# Patient Record
Sex: Female | Born: 1935 | Race: Black or African American | Hispanic: No | Marital: Married | Smoking: Current some day smoker
Health system: Southern US, Community
[De-identification: ages and names within clinical notes are randomized; demographics above are authoritative.]

## PROBLEM LIST (undated history)

## (undated) DIAGNOSIS — I499 Cardiac arrhythmia, unspecified: Secondary | ICD-10-CM

## (undated) DIAGNOSIS — K2210 Ulcer of esophagus without bleeding: Secondary | ICD-10-CM

## (undated) DIAGNOSIS — Z973 Presence of spectacles and contact lenses: Secondary | ICD-10-CM

## (undated) DIAGNOSIS — R142 Eructation: Secondary | ICD-10-CM

## (undated) DIAGNOSIS — K219 Gastro-esophageal reflux disease without esophagitis: Secondary | ICD-10-CM

## (undated) DIAGNOSIS — R143 Flatulence: Secondary | ICD-10-CM

## (undated) DIAGNOSIS — R1110 Vomiting, unspecified: Secondary | ICD-10-CM

## (undated) DIAGNOSIS — J349 Unspecified disorder of nose and nasal sinuses: Secondary | ICD-10-CM

## (undated) DIAGNOSIS — I219 Acute myocardial infarction, unspecified: Secondary | ICD-10-CM

## (undated) DIAGNOSIS — C50919 Malignant neoplasm of unspecified site of unspecified female breast: Secondary | ICD-10-CM

## (undated) DIAGNOSIS — R141 Gas pain: Secondary | ICD-10-CM

## (undated) DIAGNOSIS — M1990 Unspecified osteoarthritis, unspecified site: Secondary | ICD-10-CM

## (undated) DIAGNOSIS — I10 Essential (primary) hypertension: Secondary | ICD-10-CM

## (undated) DIAGNOSIS — R110 Nausea: Secondary | ICD-10-CM

## (undated) DIAGNOSIS — D509 Iron deficiency anemia, unspecified: Secondary | ICD-10-CM

## (undated) HISTORY — PX: BREAST EXCISIONAL BIOPSY: SUR124

## (undated) HISTORY — DX: Gastro-esophageal reflux disease without esophagitis: K21.9

## (undated) HISTORY — DX: Unspecified disorder of nose and nasal sinuses: J34.9

## (undated) HISTORY — DX: Unspecified osteoarthritis, unspecified site: M19.90

## (undated) HISTORY — DX: Iron deficiency anemia, unspecified: D50.9

## (undated) HISTORY — PX: CHOLECYSTECTOMY: SHX55

## (undated) HISTORY — DX: Cardiac arrhythmia, unspecified: I49.9

## (undated) HISTORY — DX: Presence of spectacles and contact lenses: Z97.3

## (undated) HISTORY — PX: COLONOSCOPY: SHX174

## (undated) HISTORY — DX: Ulcer of esophagus without bleeding: K22.10

## (undated) HISTORY — PX: ABDOMINAL HYSTERECTOMY: SHX81

## (undated) HISTORY — DX: Gas pain: R14.1

## (undated) HISTORY — DX: Eructation: R14.3

## (undated) HISTORY — DX: Vomiting, unspecified: R11.10

## (undated) HISTORY — PX: FOOT SURGERY: SHX648

## (undated) HISTORY — DX: Eructation: R14.2

## (undated) HISTORY — DX: Essential (primary) hypertension: I10

## (undated) HISTORY — DX: Nausea: R11.0

## (undated) HISTORY — DX: Acute myocardial infarction, unspecified: I21.9

---

## 2004-01-08 ENCOUNTER — Ambulatory Visit (HOSPITAL_COMMUNITY): Admission: RE | Admit: 2004-01-08 | Discharge: 2004-01-08 | Payer: Self-pay | Admitting: Internal Medicine

## 2004-01-08 ENCOUNTER — Emergency Department (HOSPITAL_COMMUNITY): Admission: AD | Admit: 2004-01-08 | Discharge: 2004-01-08 | Payer: Self-pay | Admitting: Internal Medicine

## 2004-03-02 ENCOUNTER — Emergency Department (HOSPITAL_COMMUNITY): Admission: EM | Admit: 2004-03-02 | Discharge: 2004-03-02 | Payer: Self-pay | Admitting: Emergency Medicine

## 2004-03-26 ENCOUNTER — Encounter: Admission: RE | Admit: 2004-03-26 | Discharge: 2004-03-26 | Payer: Self-pay | Admitting: Cardiology

## 2005-05-23 ENCOUNTER — Ambulatory Visit (HOSPITAL_COMMUNITY): Admission: RE | Admit: 2005-05-23 | Discharge: 2005-05-23 | Payer: Self-pay | Admitting: Cardiology

## 2005-10-30 IMAGING — CR DG CHEST 2V
2 series · 2 of 2 positions shown · non-contrast
Comparison: none

CLINICAL DATA: cough, fever
 TWO VIEW CHEST
 There are no prior studies available for comparison. There is mild cardiomegaly with a left ventricular configuration.  There is a vague area of increased density seen within the left base adjacent to the left heart border. This may represent small area of infiltrate. An ill-defined nodule could also get this appearance, and I would recommend a follow-up study after treatment to be certain this area clears. There is also a questionable vague nodular density associated with a right mid to lower lung zone overlying the posterior right eighth rib. Thoracic osteophytosis noted.  No hilar or mediastinal abnormalities.
 IMPRESSION
   Cardiomegaly.
  Vague      density left base which may represent a focal area of      atelectasis/infiltrate or an ill-defined nodule.  Also question nodular density right mid to lower lung zone      (vs artifactual shadow).  Recommend      follow-up study after treatment (2-3 weeks) with attention to these areas.

[view not recorded (1 of 2)]
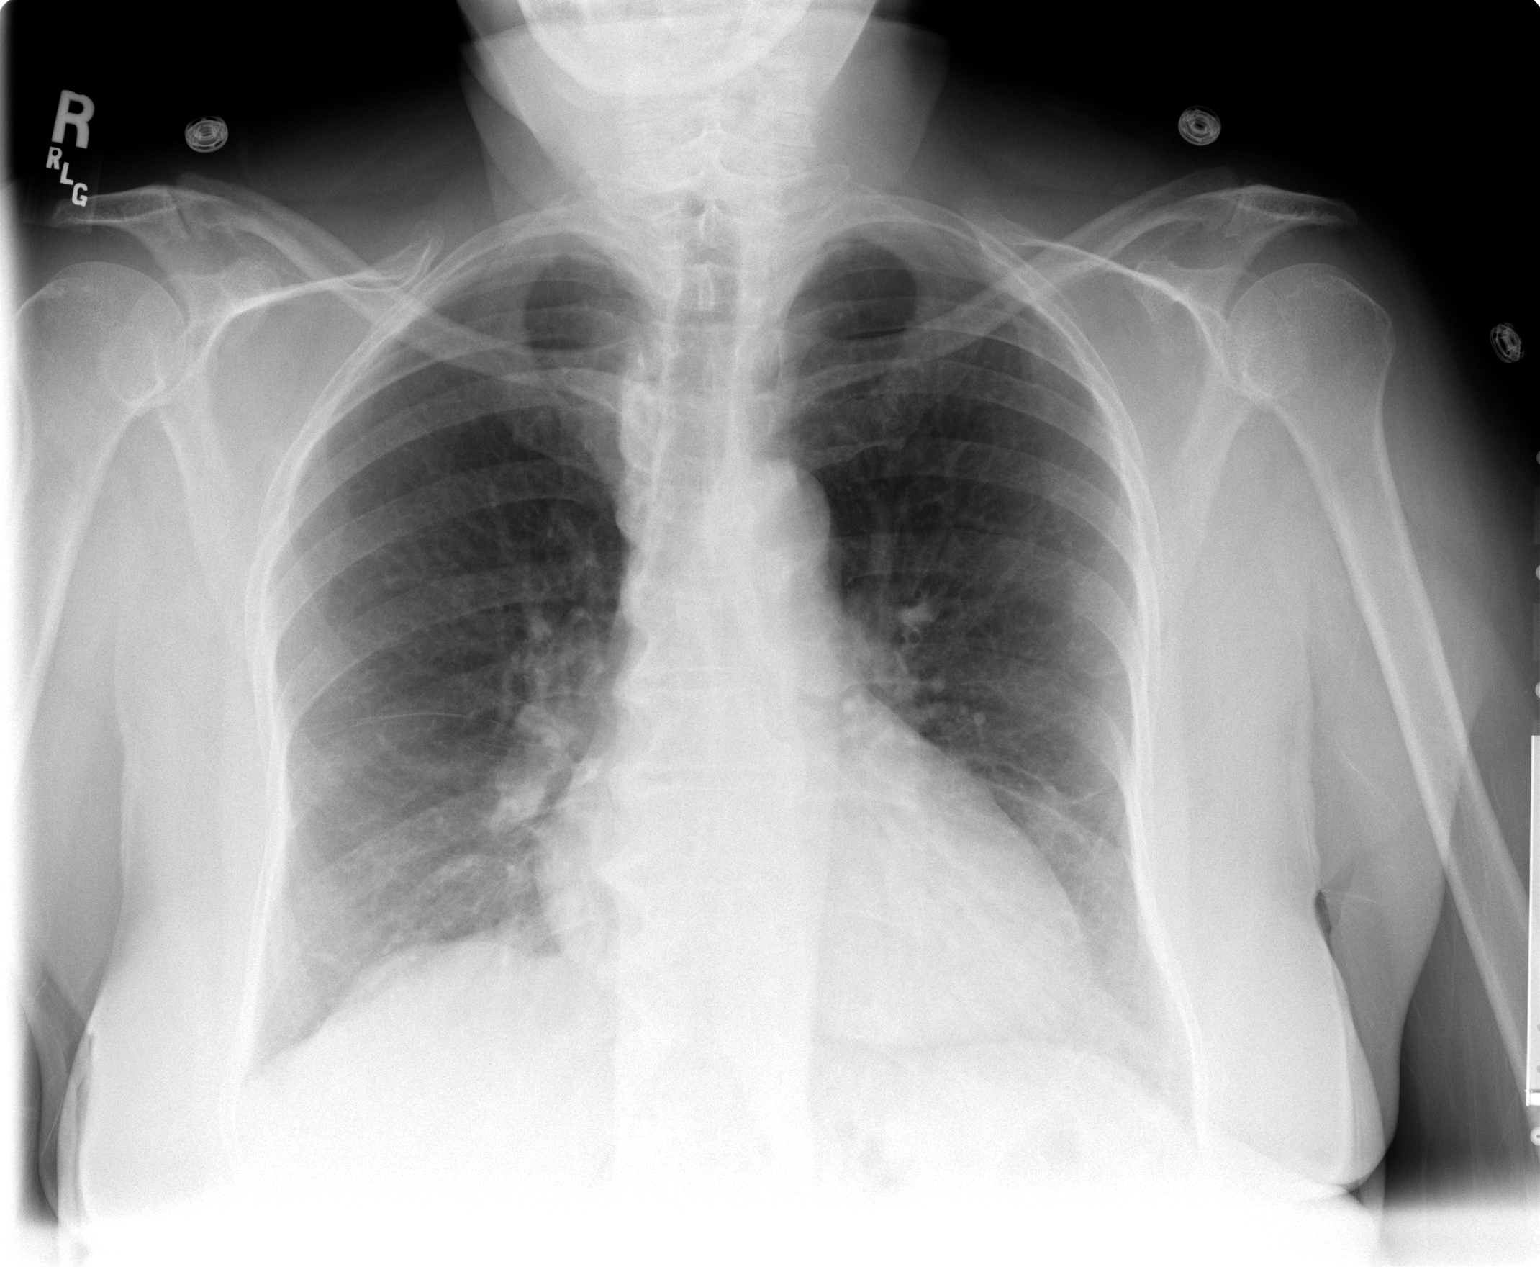

[view not recorded (2 of 2)]
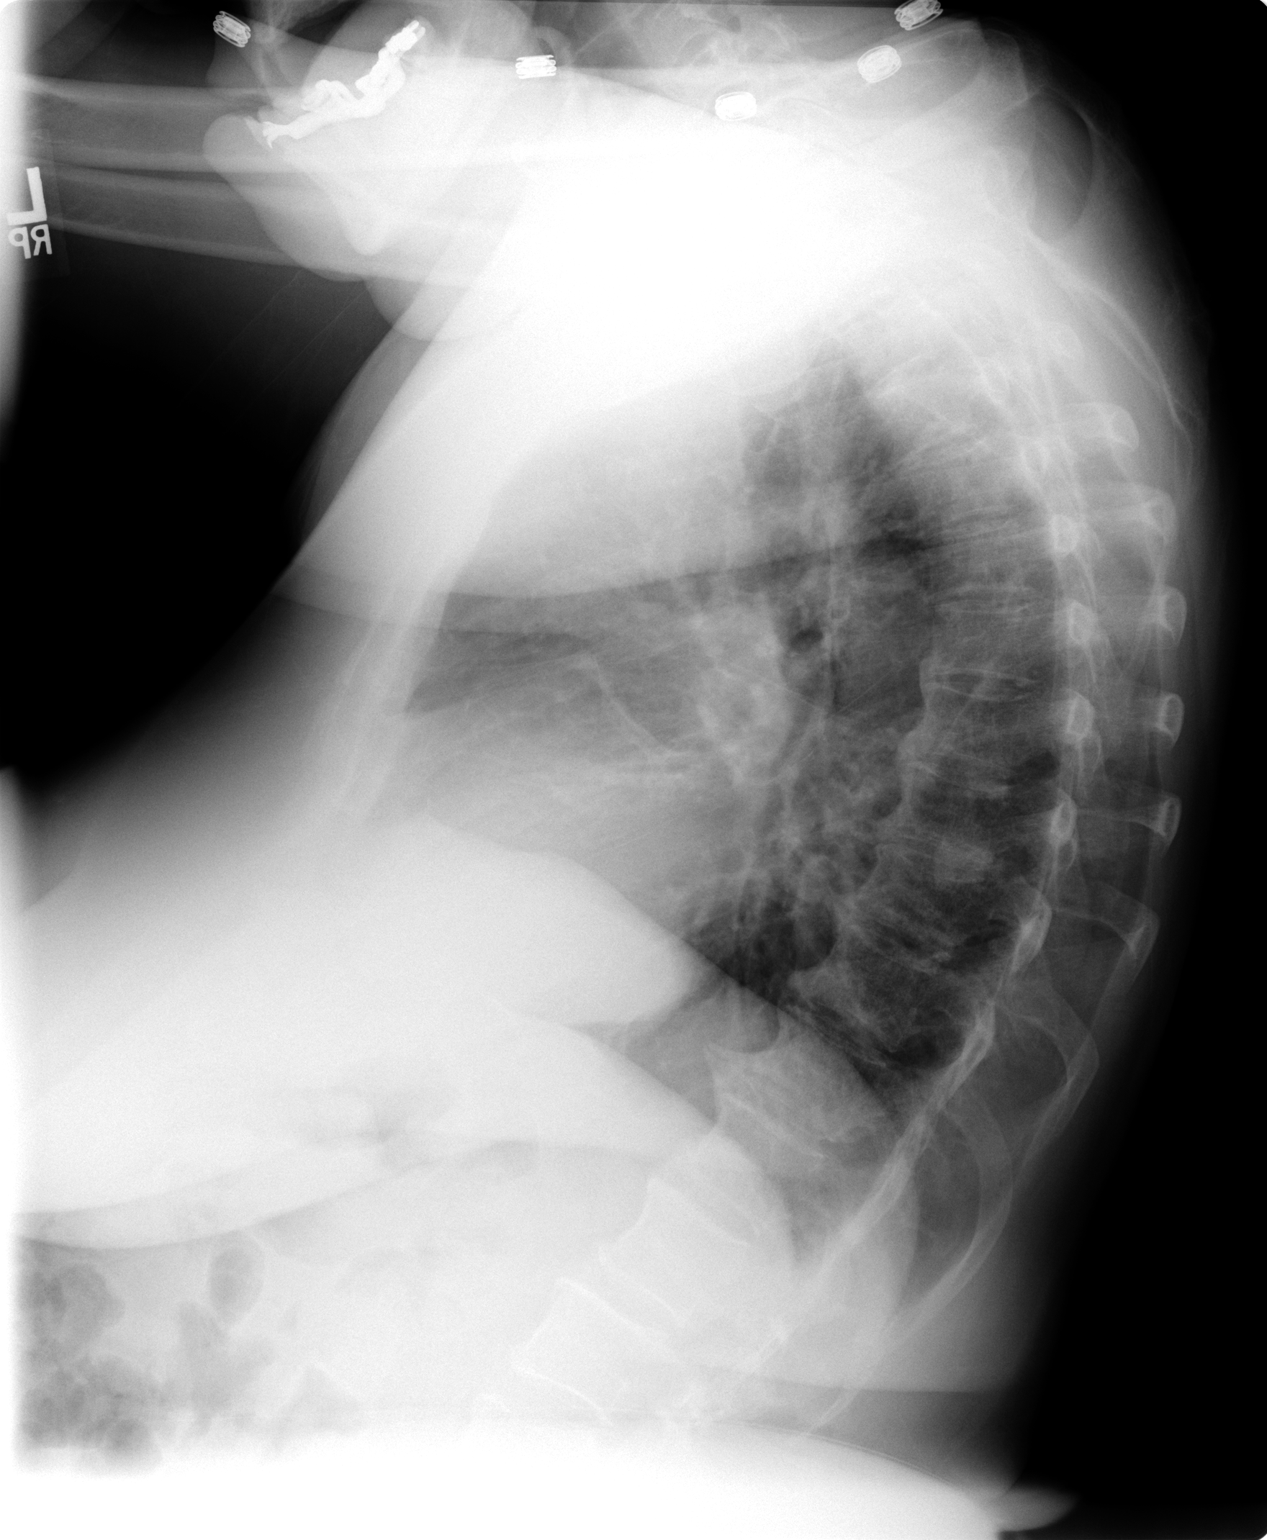

[2 of 2 positions shown; findings below may reference images not displayed]

## 2005-12-28 ENCOUNTER — Emergency Department (HOSPITAL_COMMUNITY): Admission: EM | Admit: 2005-12-28 | Discharge: 2005-12-28 | Payer: Self-pay | Admitting: Family Medicine

## 2006-08-04 ENCOUNTER — Encounter: Admission: RE | Admit: 2006-08-04 | Discharge: 2006-08-04 | Payer: Self-pay | Admitting: Cardiology

## 2007-05-01 ENCOUNTER — Encounter: Admission: RE | Admit: 2007-05-01 | Discharge: 2007-05-01 | Payer: Self-pay | Admitting: Gastroenterology

## 2007-07-17 ENCOUNTER — Encounter (INDEPENDENT_AMBULATORY_CARE_PROVIDER_SITE_OTHER): Payer: Self-pay | Admitting: Surgery

## 2007-07-17 ENCOUNTER — Inpatient Hospital Stay (HOSPITAL_COMMUNITY): Admission: RE | Admit: 2007-07-17 | Discharge: 2007-07-24 | Payer: Self-pay | Admitting: Surgery

## 2007-07-29 ENCOUNTER — Ambulatory Visit (HOSPITAL_COMMUNITY): Admission: RE | Admit: 2007-07-29 | Discharge: 2007-07-29 | Payer: Self-pay | Admitting: *Deleted

## 2007-08-17 ENCOUNTER — Ambulatory Visit (HOSPITAL_COMMUNITY): Admission: RE | Admit: 2007-08-17 | Discharge: 2007-08-17 | Payer: Self-pay | Admitting: Surgery

## 2009-03-14 IMAGING — CR DG CHEST 2V
2 series · 2 of 2 positions shown · non-contrast
Comparison: 03/26/04.

CLINICAL DATA: Preop for cholecystectomy.   
 CHEST - 2 VIEW:

[w chest pa]
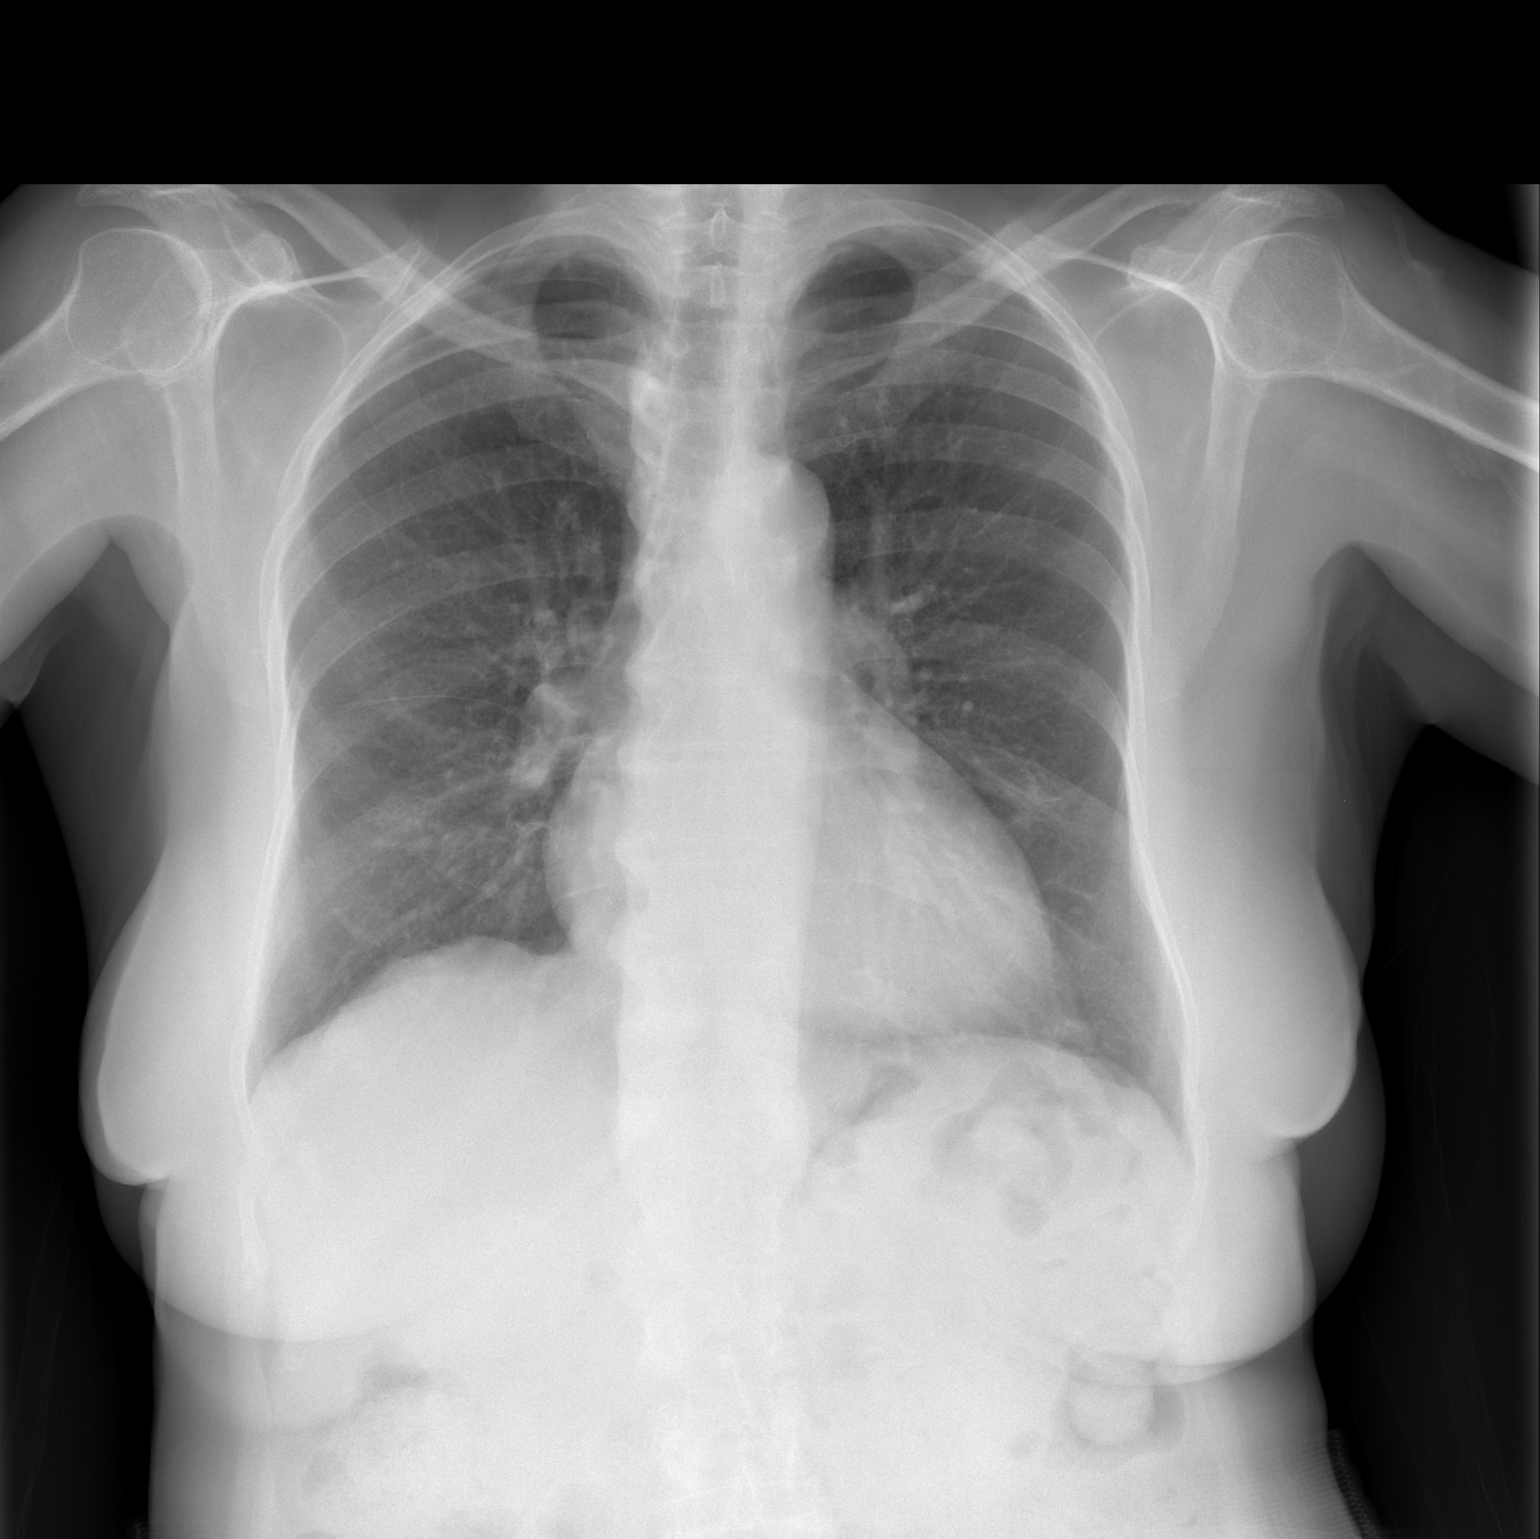

[w chest lat]
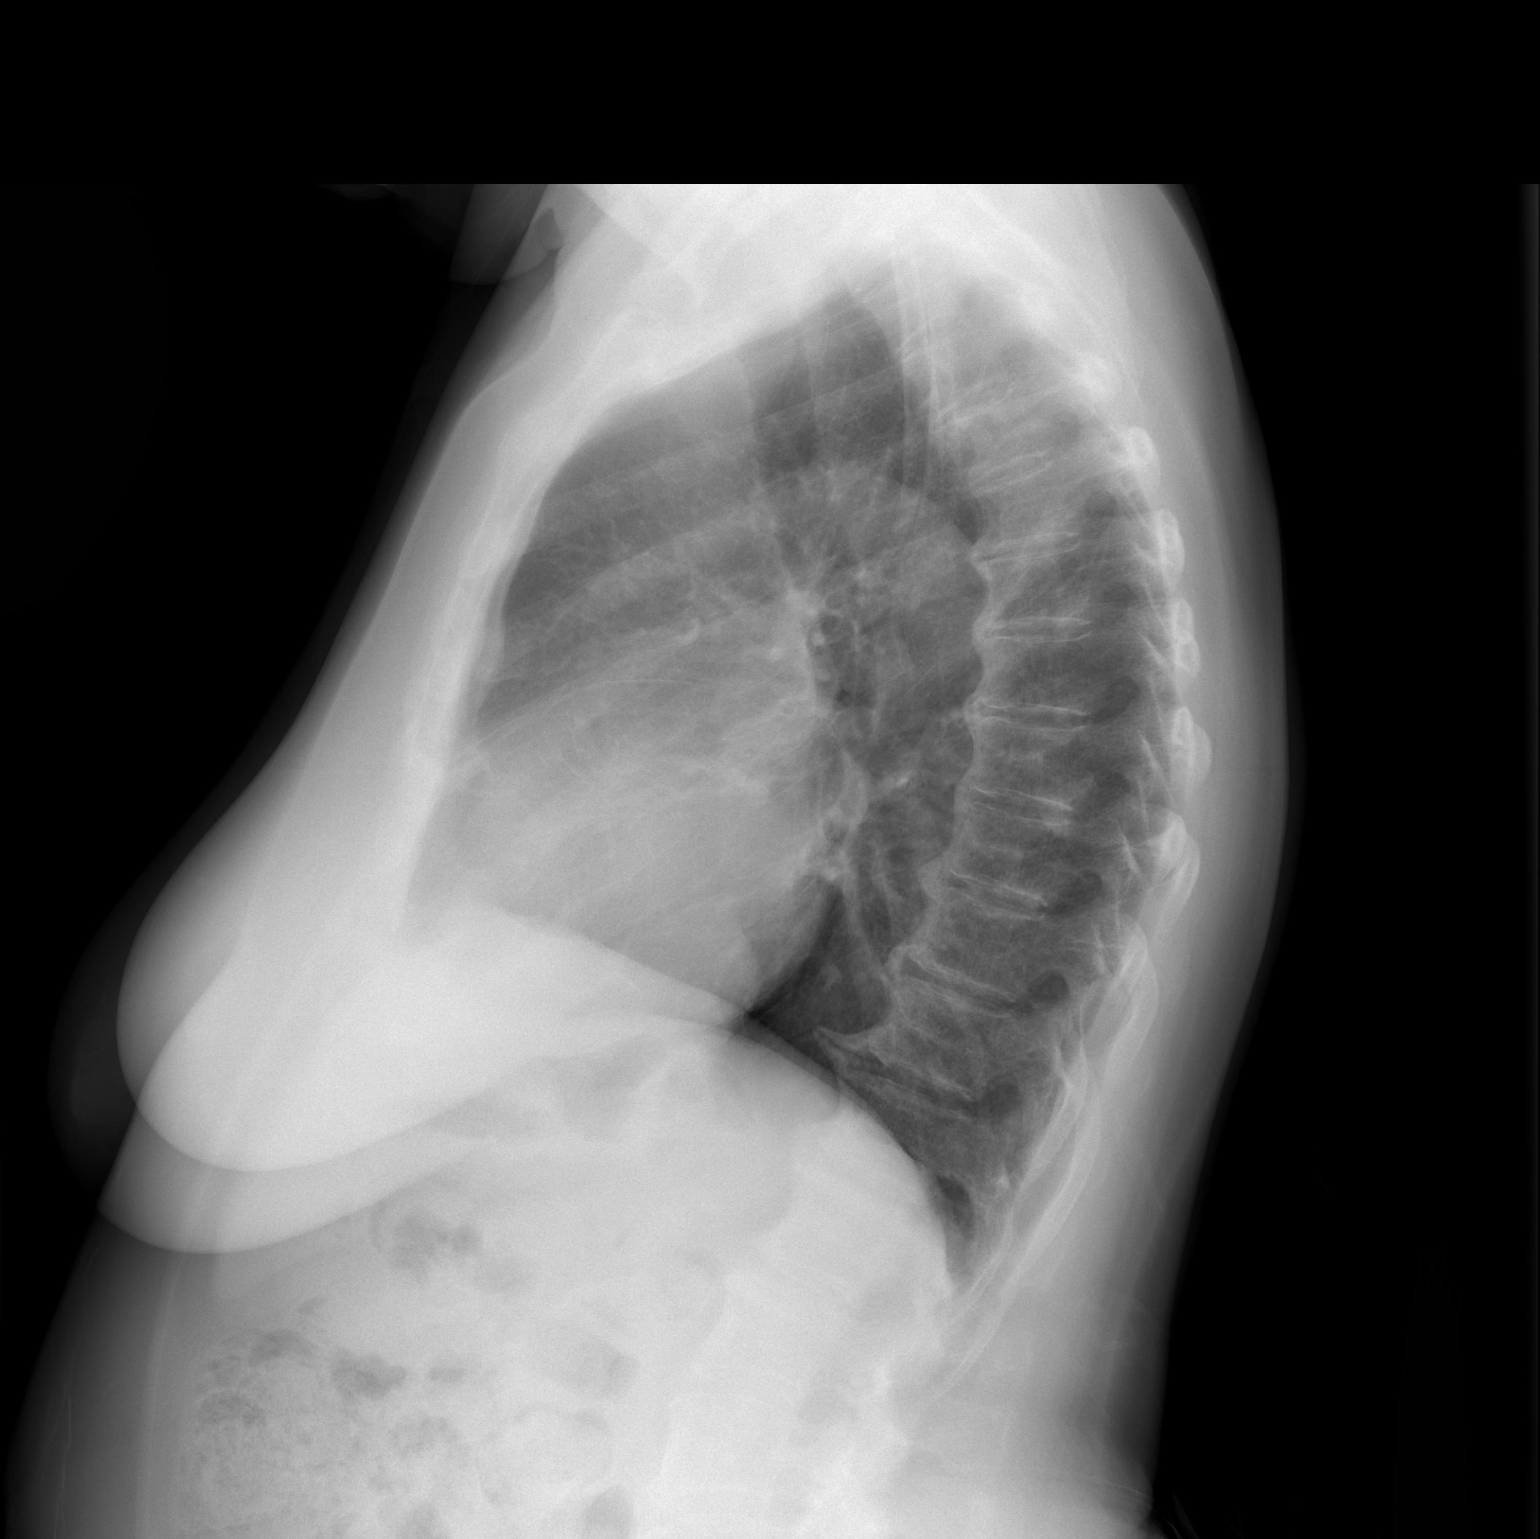

[2 of 2 positions shown; findings below may reference images not displayed]

FINDINGS: The heart is slightly enlarged as before.  There is peribronchial thickening without active air space disease.  The lungs are mildly hyperaerated.  
 There are degenerative changes of the spine.  No bony lesions.
IMPRESSION: Mild cardiomegaly and chronic lung changes ? no acute process and no significant change since 03/26/04.

## 2009-03-15 IMAGING — RF DG CHOLANGIOGRAM OPERATIVE
1 series · 8 of 8 positions shown · non-contrast
Comparison: none

CLINICAL DATA: Cholelithiasis

[Series 1: run · 2 acquisitions, 8 frames shown]
[im 1/2]
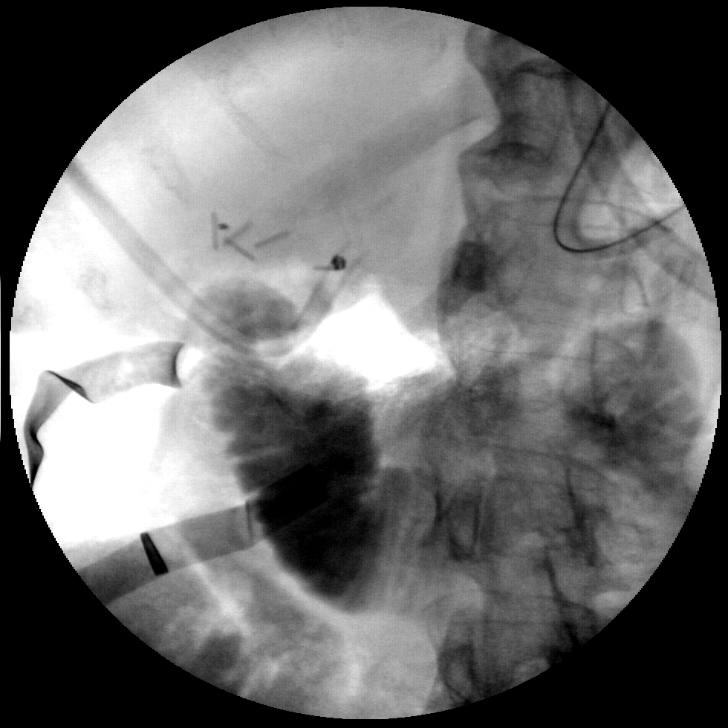
[im 1/2]
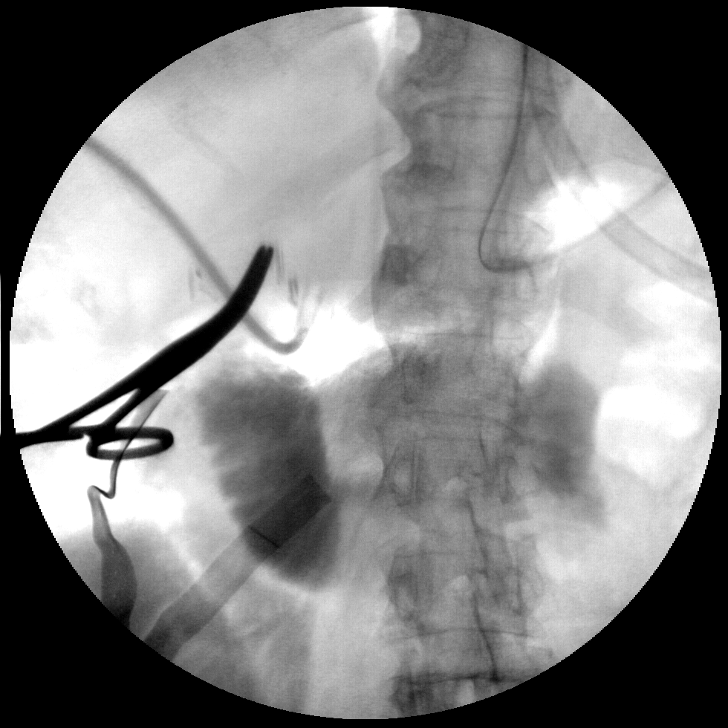
[im 1/2]
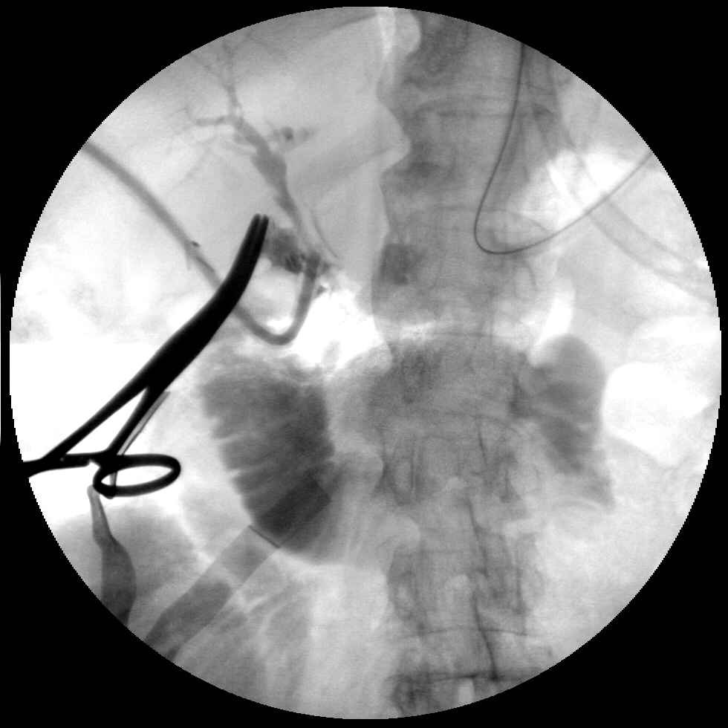
[im 1/2]
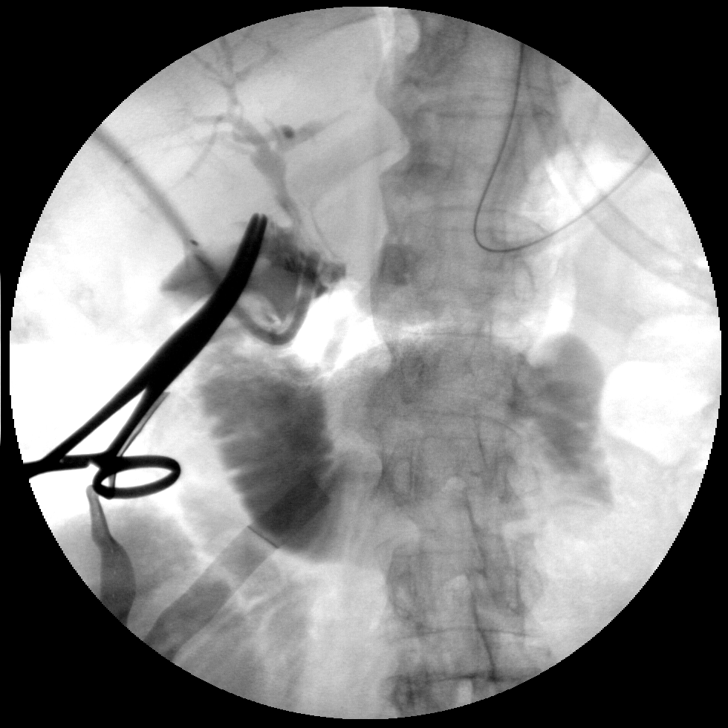
[im 2/2]
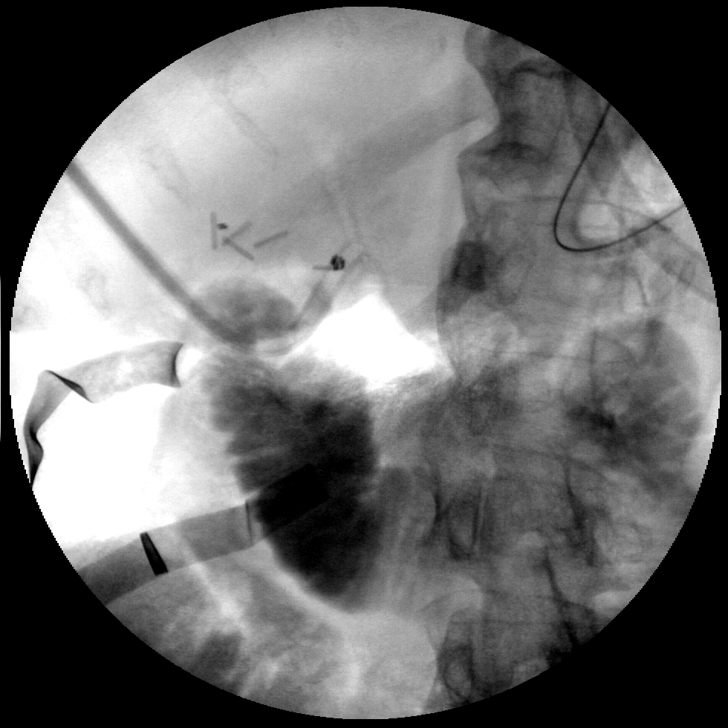
[im 2/2]
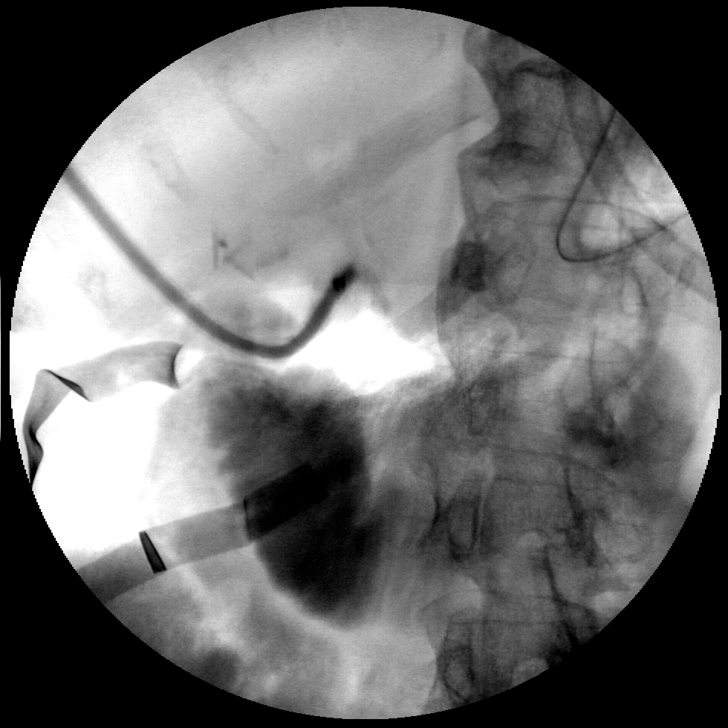
[im 2/2]
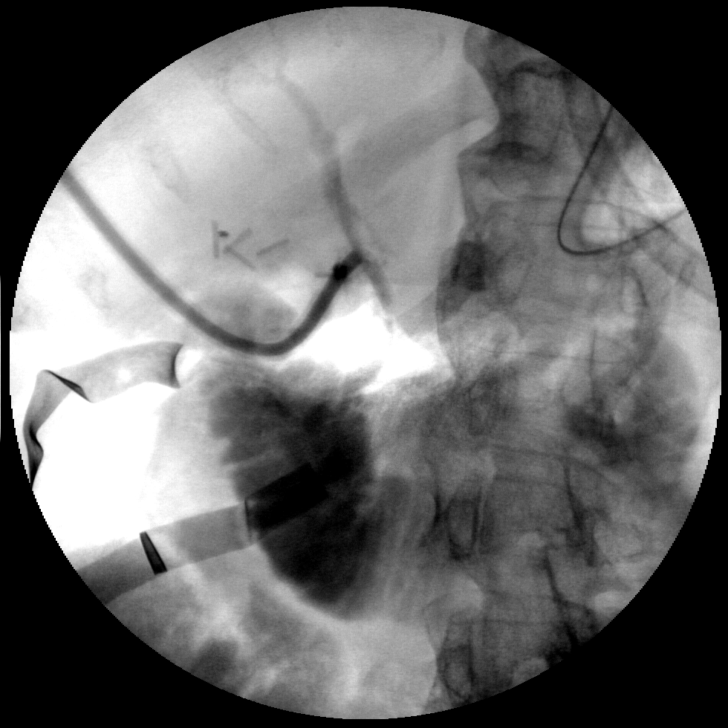
[im 2/2]
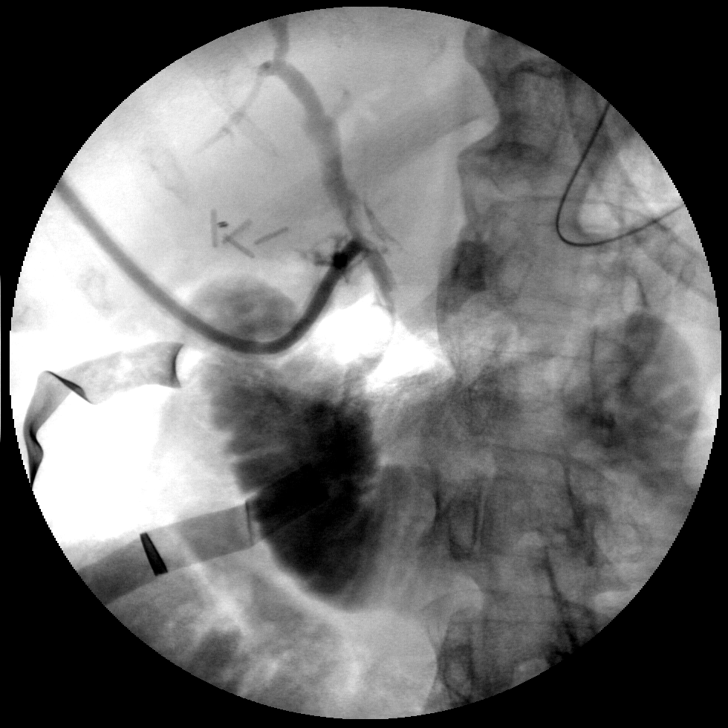

[8 of 8 positions shown; findings below may reference images not displayed]

INTRAOPERATIVE CHOLANGIOGRAM:

981  images from intraoperative C-arm fluoroscopy demonstrate segmental
opacification of the common bile duct. Final image shows placement of a T-tube.
No persistent filling defects to suggest retained stones. Large filling defect
in the distal CBD on the final run is thought to represent gas bubble. There is
incomplete evaluation of intrahepatic biliary tree, which appears decompressed
centrally. Contrast appears to flow on into decompressed duodenum.
IMPRESSION: 1. no definite retained common duct stone

## 2009-03-20 IMAGING — CR DG CHOLANGIOGRAM ADD FILMS
1 series · 1 of 1 positions shown · non-contrast
Comparison: 07/17/07.

CLINICAL DATA: Open cholecystectomy 07/17/07. 
 DIAGNOSTIC CHOLANGIOGRAM ? 07/22/07:

[view not recorded]
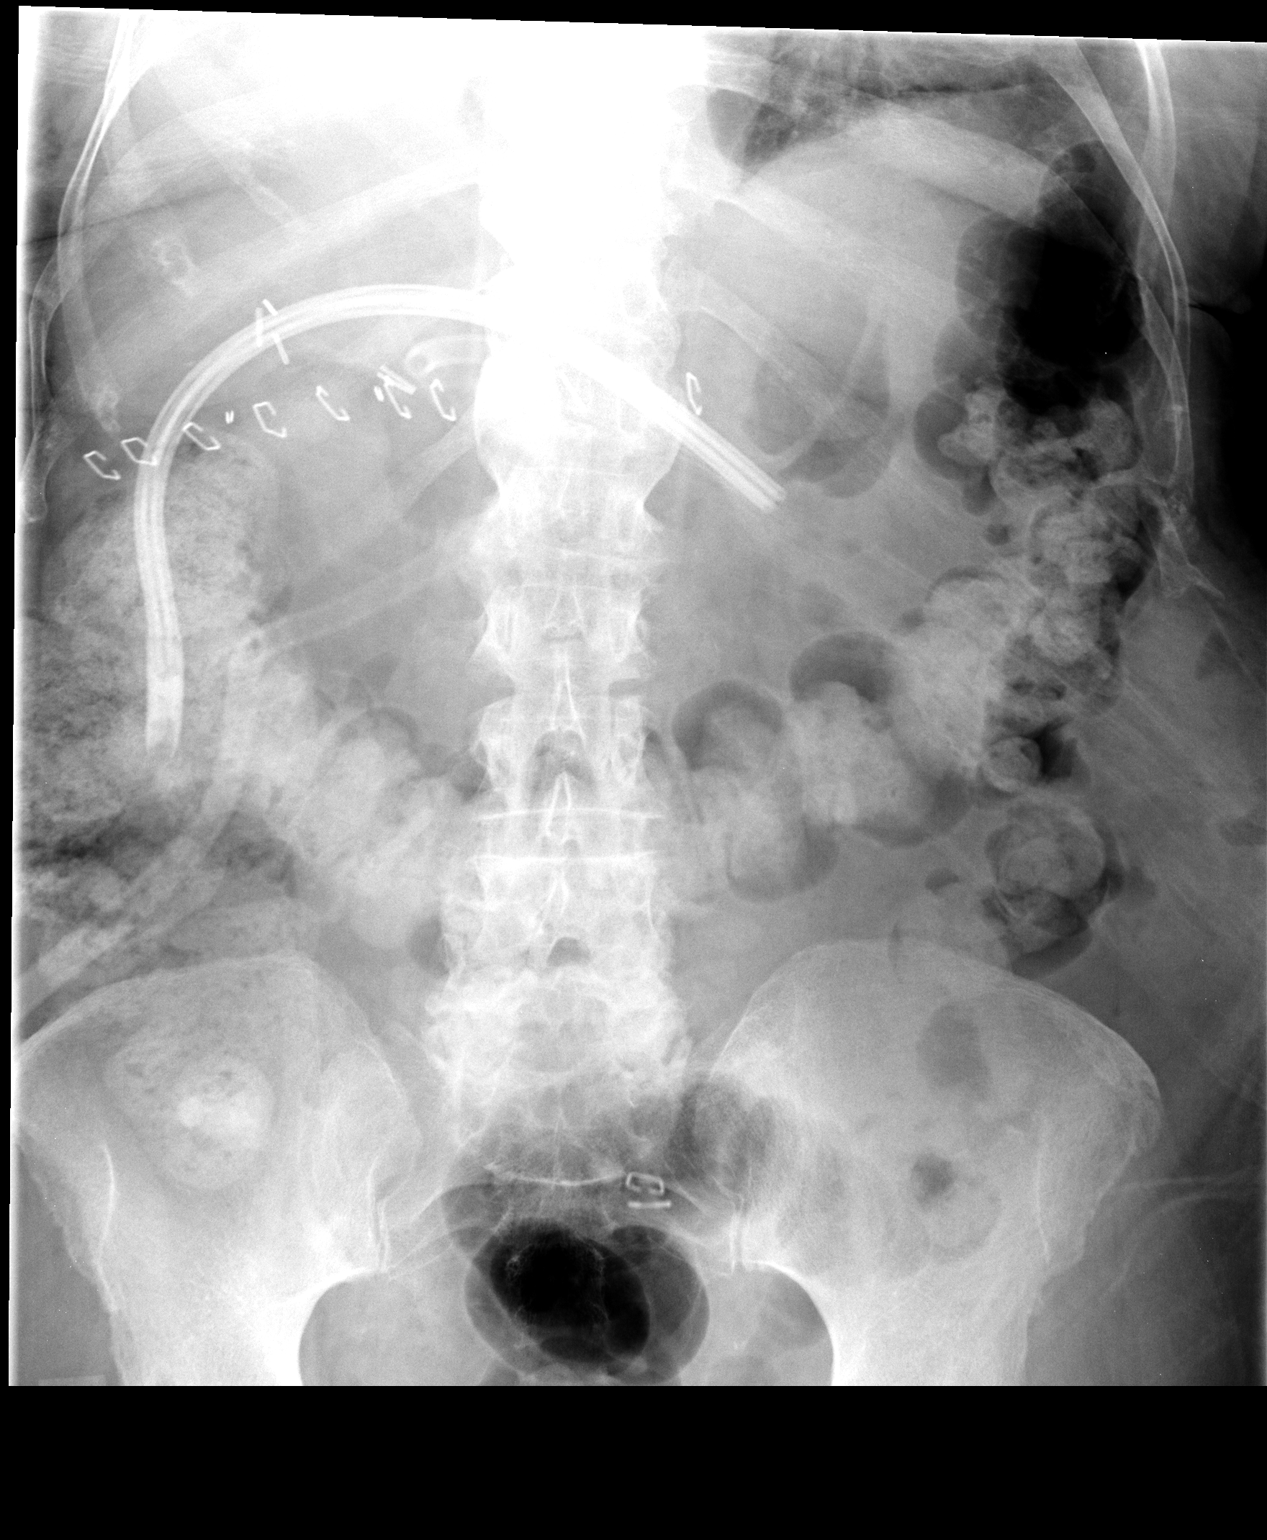

[1 of 1 positions shown; findings below may reference images not displayed]

FINDINGS: Scout view of the abdomen shows a surgical drain and T-tube in the right abdomen.   Retained contrast is seen in the colon.  
 Approximately 24 cc of Machin were hand injected.  The 1st series shows a small leak along the left lateral mid portion of the extrahepatic bile duct, at the site of the T- tube.  The biliary tree is mildly dilated.
IMPRESSION: Small biliary leak.

## 2011-05-06 ENCOUNTER — Inpatient Hospital Stay (INDEPENDENT_AMBULATORY_CARE_PROVIDER_SITE_OTHER)
Admission: RE | Admit: 2011-05-06 | Discharge: 2011-05-06 | Disposition: A | Discharge status: Home / Self Care | Payer: Medicare Other | Source: Ambulatory Visit | Attending: Family Medicine | Admitting: Family Medicine

## 2011-05-06 DIAGNOSIS — L989 Disorder of the skin and subcutaneous tissue, unspecified: Secondary | ICD-10-CM

## 2011-05-06 DIAGNOSIS — B86 Scabies: Secondary | ICD-10-CM

## 2011-05-07 NOTE — Op Note (Signed)
Samantha Abbott, Samantha Abbott           ACCOUNT NO.:  [id number]   MEDICAL RECORD NO.:  [id number]          PATIENT TYPE:  AMB   LOCATION:  DAY                          FACILITY:  [facility]   PHYSICIAN:  Thornton Park. Daphine Deutscher, MD  DATE OF BIRTH:  25-May-1925   DATE OF PROCEDURE:  07/17/2007  DATE OF DISCHARGE:                               OPERATIVE REPORT   No dictation for this job number.      Thornton Park Daphine Deutscher, MD  Electronically Signed     MBM/MEDQ  D:  07/17/2007  T:  07/17/2007  Job:  [id number]

## 2011-05-07 NOTE — Op Note (Signed)
NAMEROSELINE, EBARB           ACCOUNT NO.:  [id number]   MEDICAL RECORD NO.:  [id number]          PATIENT TYPE:  AMB   LOCATION:  DAY                          FACILITY:  [facility]   PHYSICIAN:  Thornton Park. Daphine Deutscher, MD  DATE OF BIRTH:  [birth year]   DATE OF PROCEDURE:  07/17/2007  DATE OF DISCHARGE:                               OPERATIVE REPORT   PREOPERATIVE DIAGNOSIS:  Subacute and chronic cholecystitis.   POSTOPERATIVE DIAGNOSIS:  Subacute and chronic cholecystitis with marked  chronic inflammatory changes, scarring, and fibrosis.   PROCEDURE:  Laparoscopy and laparoscopic dissection followed by  intraoperative cholangiogram delineating a dilated common bile duct  fused to the back wall of the gallbladder, open repair of the common  bile duct over a T-tube and partial cholecystectomy, oversewing a small  remnant.   SURGEON:  Thornton Park. Daphine Deutscher, M.D.   ASSISTANT:  Anselm Pancoast. Zachery Dakins, M.D.  Alfonse Ras, M.D.   ANESTHESIA:  General endotracheal anesthesia.   SPECIMEN:  Upper portion of gallbladder fundus which was resected and  sent for frozen section to rule out cancer and this appeared to just be  marked chronic inflammatory changes.   DESCRIPTION OF PROCEDURE:  Ms. Silverio was taken to room 1 on Friday  afternoon, July 17, 2007, and given general anesthesia.  The abdomen was  prepped with chlorhexidine and draped sterilely.  A longitudinal  incision was made down into the umbilicus employing Hassan technique and  entered without difficulty.  Upon entering, I placed three trocars in  the upper abdomen.  The gallbladder was completely walled off and as I  began a very tedious teasing way of the omentum off of the fundus, I was  able to identify the fundus and then was able to grasp it.  I then used  blunt dissection to peel away dense inflammatory adhesions and I worked  my way down to the infundibulum and with retraction, I saw a funnel view  which was very marked that  would indicate that the gallbladder fused  down onto the cystic duct.  However, I was suspicious because of marked  inflammatory changes and I wanted to make sure to see if this was the  common duct or the cystic duct.  I incised it and put in a Reddick  catheter and did a cholangiogram and after multiple views, determined  that this was, in fact, the common bile duct.  The gallbladder was fused  down onto it as is seen in really severe, fibrotic, acute and subacute  chronic cholecystitis.   I therefore, went ahead and opened.  The upper portion of the  gallbladder was hard and had some areas worrisome for cancer, so I  resected the fundus and took this down to the infundibulum which I  ultimately ended up preserving and just oversewing.  However, I removed  the catheter and had to tailor several different T-tubes before finding  one that I cut the back wall out of and was able to slip into the small  opening.  It was a small transverse opening and I did not want to make  it  any bigger, once I finally got it in there, I stitched it in place  with three sutures of 4-0 Vicryl and applied Tisseel to it.  The T-tube  cholangiogram showed good proximal and distal filling.  It was brought  out through a separate stab wound and sutured to the skin with 2-0 silk  and 2-0 nylon.  In the meantime, as I said, I removed that down to the  infundibulum.  I oversewed that with figure-of-eight sutures of 2-0  Vicryl.  A separate  drain was placed in the gallbladder fossa and brought out through  another stab wound and then the wound, itself, was closed in two layers  with running #1 PDS.  I injected the wound with Marcaine and closed the  skin with staples.  The patient seemed to tolerate the procedure well  and was taken to the recovery room in satisfactory condition.      Thornton Park Daphine Deutscher, MD  Electronically Signed     MBM/MEDQ  D:  07/17/2007  T:  07/18/2007  Job:  [id number]   cc:   Petra Kuba, M.D.  Fax: [phone]   Osvaldo Shipper. Spruill, M.D.  Fax: [phone]

## 2011-05-07 NOTE — Discharge Summary (Signed)
NAMEDENEICE, WACK           ACCOUNT NO.:  [id number]   MEDICAL RECORD NO.:  [id number]          PATIENT TYPE:  INP   LOCATION:  1609                         FACILITY:  [facility]   PHYSICIAN:  Thornton Park. Daphine Deutscher, MD  DATE OF BIRTH:  12/21/1925   DATE OF ADMISSION:  07/17/2007  DATE OF DISCHARGE:  07/24/2007                         DISCHARGE SUMMARY - REFERRING   ADMISSION DIAGNOSIS:  Severe cholecystitis.   DISCHARGE DIAGNOSIS:  Subacute cholecystitis, status post  laparoscopic/open cholecystectomy with T-tube exploration common bile  duct .   HOSPITAL COURSE:  Samantha Abbott is a 77 year old lady who was  admitted and underwent a laparoscopic cholecystectomy, converted to an  open cholecystectomy.  Her gallbladder was stuck down on her common duct  and a cholangiogram correctly identified her common duct to appear to be  her gallbladder.  This incision in the common duct was converted for a T-  tube placement as a common duct exploration.  She is followed in my  absence after I left on postoperative day #3 by Dr. Colin Benton and had a  cholangiogram which showed a little bit of a leak and her JP was left in  place.  She was discharged home for follow-up cholangiogram the  following week.   FINAL DIAGNOSIS:  Severe biliary disease with subacute and chronic  cholecystitis.      Thornton Park Daphine Deutscher, MD  Electronically Signed     MBM/MEDQ  D:  08/11/2007  T:  08/11/2007  Job:  [id number]

## 2011-10-07 LAB — COMPREHENSIVE METABOLIC PANEL
ALT: 11
ALT: 13
AST: 13
AST: 17
Albumin: 2.2 — ABNORMAL LOW
Albumin: 2.6 — ABNORMAL LOW
Albumin: 3.3 — ABNORMAL LOW
Alkaline Phosphatase: 69
Alkaline Phosphatase: 69
BUN: 11
BUN: 2 — ABNORMAL LOW
BUN: 6
CO2: 24
CO2: 29
Calcium: 8.3 — ABNORMAL LOW
Calcium: 8.3 — ABNORMAL LOW
Calcium: 9.7
Chloride: 104
Chloride: 105
Chloride: 108
Creatinine, Ser: 0.85
GFR calc Af Amer: 56 — ABNORMAL LOW
GFR calc Af Amer: >60
GFR calc non Af Amer: 46 — ABNORMAL LOW
GFR calc non Af Amer: >60
Glucose, Bld: 122 — ABNORMAL HIGH
Glucose, Bld: 135 — ABNORMAL HIGH
Glucose, Bld: 153 — ABNORMAL HIGH
Potassium: 4.1
Potassium: 4.5
Sodium: 136
Sodium: 143
Total Bilirubin: 0.5
Total Bilirubin: 0.6
Total Bilirubin: 0.7
Total Protein: 5.8 — ABNORMAL LOW
Total Protein: 9.6 — ABNORMAL HIGH

## 2011-10-07 LAB — CBC
HCT: 26.5 — ABNORMAL LOW
HCT: 30.4 — ABNORMAL LOW
HCT: 31 — ABNORMAL LOW
Hemoglobin: 10.5 — ABNORMAL LOW
Hemoglobin: 9.2 — ABNORMAL LOW
MCHC: 33.8
MCHC: 34.4
MCV: 91.5
Platelets: 371
Platelets: 402 — ABNORMAL HIGH
RBC: 2.92 — ABNORMAL LOW
RBC: 3.39 — ABNORMAL LOW
RBC: 3.77 — ABNORMAL LOW
RDW: 15.2 — ABNORMAL HIGH
RDW: 15.2 — ABNORMAL HIGH
WBC: 10.5
WBC: 13.1 — ABNORMAL HIGH
WBC: 7.6

## 2011-10-14 ENCOUNTER — Other Ambulatory Visit: Payer: Self-pay | Admitting: Gastroenterology

## 2011-10-16 ENCOUNTER — Ambulatory Visit
Admission: RE | Admit: 2011-10-16 | Discharge: 2011-10-16 | Disposition: A | Discharge status: Home / Self Care | Payer: Medicare Other | Source: Ambulatory Visit | Attending: Gastroenterology | Admitting: Gastroenterology

## 2011-10-16 MED ORDER — IOHEXOL 300 MG/ML  SOLN: 100.0000 mL | Freq: Once | INTRAMUSCULAR | Status: AC | PRN

## 2011-10-21 ENCOUNTER — Ambulatory Visit (HOSPITAL_COMMUNITY)
Admission: RE | Admit: 2011-10-21 | Discharge: 2011-10-21 | Disposition: A | Discharge status: Home / Self Care | Payer: Medicare Other | Source: Ambulatory Visit | Attending: Gastroenterology | Admitting: Gastroenterology

## 2011-10-22 ENCOUNTER — Ambulatory Visit (HOSPITAL_COMMUNITY)
Admission: RE | Admit: 2011-10-22 | Discharge: 2011-10-22 | Disposition: A | Discharge status: Home / Self Care | Payer: Medicare Other | Source: Ambulatory Visit | Attending: Gastroenterology | Admitting: Gastroenterology

## 2011-10-22 ENCOUNTER — Other Ambulatory Visit: Payer: Self-pay | Admitting: Gastroenterology

## 2011-10-22 DIAGNOSIS — R109 Unspecified abdominal pain: Secondary | ICD-10-CM | POA: Insufficient documentation

## 2011-10-22 DIAGNOSIS — R143 Flatulence: Secondary | ICD-10-CM | POA: Insufficient documentation

## 2011-10-22 DIAGNOSIS — D509 Iron deficiency anemia, unspecified: Secondary | ICD-10-CM | POA: Insufficient documentation

## 2011-10-22 DIAGNOSIS — I10 Essential (primary) hypertension: Secondary | ICD-10-CM | POA: Insufficient documentation

## 2011-10-22 DIAGNOSIS — R141 Gas pain: Secondary | ICD-10-CM | POA: Insufficient documentation

## 2011-10-22 DIAGNOSIS — K5730 Diverticulosis of large intestine without perforation or abscess without bleeding: Secondary | ICD-10-CM | POA: Insufficient documentation

## 2011-10-22 DIAGNOSIS — F17200 Nicotine dependence, unspecified, uncomplicated: Secondary | ICD-10-CM | POA: Insufficient documentation

## 2011-10-22 DIAGNOSIS — K2210 Ulcer of esophagus without bleeding: Secondary | ICD-10-CM | POA: Insufficient documentation

## 2011-10-22 DIAGNOSIS — R112 Nausea with vomiting, unspecified: Secondary | ICD-10-CM | POA: Insufficient documentation

## 2011-10-22 DIAGNOSIS — R142 Eructation: Secondary | ICD-10-CM | POA: Insufficient documentation

## 2011-10-22 NOTE — Op Note (Signed)
Samantha Abbott, Samantha Abbott           ACCOUNT NO.:  [id number]  MEDICAL RECORD NO.:  [id number]  LOCATION:  WLEN                         FACILITY:  [facility]  PHYSICIAN:  Petra Kuba, M.D.    DATE OF BIRTH:  1925/04/08  DATE OF PROCEDURE:  10/22/2011 DATE OF DISCHARGE:                              OPERATIVE REPORT   PROCEDURE:  Flexible sigmoidoscopy with biopsy.  INDICATION:  The patient with change in bowel habits, left-sided abdominal pain, anemia, abnormal CT, worried from a mass, history of difficult to remove polyp in a patient who is overdue for repeat screening.  Consent was signed after risks, benefits, methods, options thoroughly discussed multiple times in the past.  MEDICINES USED PER ANESTHESIA:  150 propofol, 100 mcg of fentanyl, 2 mg of Versed.  PROCEDURE IN DETAIL:  Rectal inspection was pertinent for tiny external hemorrhoids.  Digital exam was negative.  The pediatric video colonoscope was inserted and easily advanced to the distal sigmoid area of compression, which is at the second sigmoid turn and with air insufflation and lots of washing, we were able to advance through this spastic strictured area.  A few diverticula were seen in this area and what looked like a mass lesion was seen, but this was a poor prep.  She was unable to tolerate the prep and once through this area, we were in area of liquid stool which could not be washed and suctioned and we were unable to advance past 40 cm.  We elected to slowly withdrawal.  At the more proximal area, a mass-like lesion was seen and a few cold biopsies were obtained.  It was difficult to maintain position and there was increased spasm in this area, and difficult with the prep to have adequate visualization.  We did have to withdraw the scope one time to clean the scope, but it was able to be reinserted as above and a few more biopsies were obtained.  The strictured spastic mass-like area was about 5 cm from about  30-35 cm.  The rest of the distal sigmoid was normal.  Anorectal pull-through and retroflexion confirmed some small hemorrhoids internally, but no additional findings.  Air was suctioned. Scope removed.  The patient tolerated the procedure well.  There was no obvious immediate complication.  ENDOSCOPIC DIAGNOSES: 1. Poor prep. 2. Spastic diverticular strictured area by probable mass as well,     status post biopsy from about 30-35 cm. 3. Unable to advance any further due to poor prep above stricture. 4. Minimal internal greater than external hemorrhoids.  PLAN:  We will await pathology.  I believe she will need surgical removal and we will try our on chronic MiraLax to make the prep easier. I am hoping 2 or 3 days of clear liquids in general 2-3 day of a prep. We will allow adequate prepping for hopefully primary anastomosis and we will get her in to see surgeon ASAP.          ______________________________ Petra Kuba, M.D.     MEM/MEDQ  D:  10/22/2011  T:  10/22/2011  Job:  [id number]  cc:   Eduardo Osier. Sharyn Lull, M.D. Fax: [phone]  Thornton Park Daphine Deutscher, MD [address]  [address], [address] [place] [address]  Osvaldo Shipper. Spruill, M.D. Fax: [phone]  Electronically Signed by Vida Rigger M.D. on 10/22/2011 04:29:08 PM

## 2011-10-28 ENCOUNTER — Encounter (INDEPENDENT_AMBULATORY_CARE_PROVIDER_SITE_OTHER): Payer: Self-pay | Admitting: General Surgery

## 2011-10-30 ENCOUNTER — Encounter (INDEPENDENT_AMBULATORY_CARE_PROVIDER_SITE_OTHER): Payer: Self-pay | Admitting: General Surgery

## 2011-10-31 ENCOUNTER — Encounter (INDEPENDENT_AMBULATORY_CARE_PROVIDER_SITE_OTHER): Payer: Self-pay | Admitting: Surgery

## 2011-10-31 ENCOUNTER — Ambulatory Visit (INDEPENDENT_AMBULATORY_CARE_PROVIDER_SITE_OTHER): Payer: Medicare Other | Admitting: Surgery

## 2011-10-31 DIAGNOSIS — K56609 Unspecified intestinal obstruction, unspecified as to partial versus complete obstruction: Secondary | ICD-10-CM

## 2011-10-31 DIAGNOSIS — K219 Gastro-esophageal reflux disease without esophagitis: Secondary | ICD-10-CM

## 2011-10-31 DIAGNOSIS — K56699 Other intestinal obstruction unspecified as to partial versus complete obstruction: Secondary | ICD-10-CM

## 2011-10-31 DIAGNOSIS — I499 Cardiac arrhythmia, unspecified: Secondary | ICD-10-CM

## 2011-10-31 NOTE — Progress Notes (Signed)
Subjective:     Patient ID: Samantha Abbott, female   DOB: 13-Apr-1925, 77 y.o.   MRN: [id number]  HPI  Samantha Abbott comes in today with her biopsy report from Dr. Ewing Schlein which was benign but was taken at a stricture in the sigmoid colon around 35 cm. Hopefully this is just a diverticular stricture but a colon cancer in her cannot be ruled out.  I described laparoscopically assisted sigmoid colectomy to her in some detail. I mentioned that there is always a chance of temporary colostomy. I did a laparoscopic cholecystectomy on her so she is familiar with this technique. We will need to keep her on a liquid diet until her surgery and do a 2 day bowel prep. However like to get her cardiologist notes and get cardiac clearance before proceeding. Patient Active Problem List  Diagnoses  . Stricture of colon  . GERD (gastroesophageal reflux disease)  . Irregular heartbeat   Past Medical History  Diagnosis Date  . Abnormal heart rhythm   . Hypertension   . Myocardial infarction   . GERD (gastroesophageal reflux disease)   . Nausea   . Vomiting   . Arthritis   . Iron deficiency anemia   . Abdominal pain   . Esophageal ulcer   . Flatulence, eructation, and gas pain   SPRUILL,JEROME O, MD No Known Allergies No current outpatient prescriptions on file.   Past Surgical History  Procedure Date  . Abdominal hysterectomy   . Cholecystectomy   . Foot surgery   . Colonoscopy        Review of Systems positive for GERD, Abominal distention, anemia     Objective:   Physical Exam.well-developed well-nourished pain elderly African American lady in no acute distress Filed Vitals:   10/31/11 0933  BP: 186/82  Pulse: 64  Temp: 96.4 F (35.8 C)  Resp: 18  head normocephalic, neck no bruits or masses, chest clear to auscultation, heart irregularly irregular rhythm suggestive of atrial fibrillation, no murmurs.  Abdomen nontender with mild distention. Healed laparoscopy scars. Pelvic exam not  performed  Extremities full range of motion, ambulatory. Neuro alert and oriented x3, motor and sensory function are grossly intact. Psych appropriate affect and apparent fund of knowledge. Cognition appropriate for age.      Assessment:     Colonic stricture at 35 cm,    Plan:     Laparoscopically assisted sigmoid colectomy at [facility] long under general anesthesia.  We'll get cardiac clearance from Dr. Sharyn Lull

## 2011-10-31 NOTE — Patient Instructions (Signed)
Stay on a full liquid diet Had protein shakes to your liquid diet Before surgery, complete 2 day bowel prep.

## 2011-11-27 ENCOUNTER — Encounter (HOSPITAL_COMMUNITY): Payer: Self-pay | Admitting: Pharmacy Technician

## 2011-11-29 ENCOUNTER — Other Ambulatory Visit: Payer: Self-pay

## 2011-11-29 ENCOUNTER — Ambulatory Visit (HOSPITAL_COMMUNITY)
Admission: RE | Admit: 2011-11-29 | Discharge: 2011-11-29 | Disposition: A | Discharge status: Home / Self Care | Payer: Medicare Other | Source: Ambulatory Visit | Attending: Surgery | Admitting: Surgery

## 2011-11-29 ENCOUNTER — Encounter (HOSPITAL_COMMUNITY): Payer: Self-pay

## 2011-11-29 ENCOUNTER — Encounter (HOSPITAL_COMMUNITY)
Admission: RE | Admit: 2011-11-29 | Discharge: 2011-11-29 | Disposition: A | Discharge status: Home / Self Care | Payer: Medicare Other | Source: Ambulatory Visit | Attending: Surgery | Admitting: Surgery

## 2011-11-29 DIAGNOSIS — I498 Other specified cardiac arrhythmias: Secondary | ICD-10-CM | POA: Insufficient documentation

## 2011-11-29 DIAGNOSIS — Z01812 Encounter for preprocedural laboratory examination: Secondary | ICD-10-CM | POA: Insufficient documentation

## 2011-11-29 DIAGNOSIS — Z01818 Encounter for other preprocedural examination: Secondary | ICD-10-CM | POA: Insufficient documentation

## 2011-11-29 DIAGNOSIS — Z01810 Encounter for preprocedural cardiovascular examination: Secondary | ICD-10-CM | POA: Insufficient documentation

## 2011-11-29 HISTORY — DX: Cardiac arrhythmia, unspecified: I49.9

## 2011-11-29 LAB — BASIC METABOLIC PANEL
GFR calc Af Amer: 74 mL/min — ABNORMAL LOW (ref >90)
GFR calc non Af Amer: 63 mL/min — ABNORMAL LOW (ref >90)
Potassium: 4 mEq/L (ref 3.5–5.1)
Sodium: 140 mEq/L (ref 135–145)

## 2011-11-29 LAB — CBC
MCHC: 31.5 g/dL (ref 30.0–36.0)
RDW: 16.5 % — ABNORMAL HIGH (ref 11.5–15.5)

## 2011-11-29 LAB — SURGICAL PCR SCREEN

## 2011-11-29 LAB — ABO/RH: ABO/RH(D): A POS

## 2011-11-29 NOTE — Patient Instructions (Addendum)
[address] Ne[address] MARGARETE HORACE  11/29/2011   Your procedure is scheduled on:  12/ 14 12   Friday  Surgery [id number]  Report to [facility] at 0515 AM.  Call this number if you have problems the morning of surgery: [phone]   Or  Shantell Belongia   PST [phone]   Remember:   Do not eat food:After Midnight. Tuesday NIGHT  May have clear liquids:until Midnight . Thursday  Night/    INCREASE FLUIDS THURSDAY  Clear liquids include soda, tea, black coffee, apple or grape juice, broth.  Take these medicines the morning of surgery with A SIP OF WATER: none              AS PER OFFICE---FULL LIQUIDS BEGINNING 12/04/11- then CLEARS starting 12/05/11- no dairy products-   TO TAKE MAGNESIUM CITRATE AS PER WRITTEN OFFICE INSTRUCTIONS, ANTIBIOTICS THURS AS INSTRUCTED.  Do not wear jewelry, make-up or nail polish.  Do not wear lotions, powders, or perfumes. You may wear deodorant.  Do not shave 48 hours prior to surgery.  Do not bring valuables to the hospital.  Contacts, dentures or bridgework may not be worn into surgery.  Leave suitcase in the car. After surgery it may be brought to your room.  For patients admitted to the hospital, checkout time is 11:00 AM the day of discharge.              No aspirin, antiinflammatories or herbals 5 days pre op  Patients discharged the day of surgery will not be allowed to drive home.  Name and phone number of your driver: husband  Special Instructions: CHG Shower Use Special Wash: 1/2 bottle night before surgery and 1/2 bottle morning of surgery.   Regular soap face and privates.  No shaving x 48 hours   Please read over the following fact sheets that you were given: MRSA Information

## 2011-11-29 NOTE — Pre-Procedure Instructions (Signed)
SEEN PER DR ROSE AT PST APPT- STATED FOR TELEMETRY MONITORING POST OP

## 2011-12-02 ENCOUNTER — Encounter (HOSPITAL_COMMUNITY): Payer: Self-pay

## 2011-12-02 NOTE — Pre-Procedure Instructions (Signed)
LOV 11/12 with EKG 10/30/11   RECEIVED AND PLACED ON CHART

## 2011-12-06 ENCOUNTER — Encounter (HOSPITAL_COMMUNITY): Payer: Self-pay

## 2011-12-06 ENCOUNTER — Encounter (HOSPITAL_COMMUNITY)
Admission: RE | Disposition: A | Discharge status: Home / Self Care | Payer: Self-pay | Source: Ambulatory Visit | Attending: Surgery

## 2011-12-06 ENCOUNTER — Inpatient Hospital Stay (HOSPITAL_COMMUNITY)
Admission: RE | Admit: 2011-12-06 | Discharge: 2011-12-12 | DRG: 330 | Disposition: A | Discharge status: Home Health | Payer: Medicare Other | Source: Ambulatory Visit | Attending: Surgery | Admitting: Surgery

## 2011-12-06 ENCOUNTER — Other Ambulatory Visit: Payer: Self-pay | Admitting: Urology

## 2011-12-06 ENCOUNTER — Inpatient Hospital Stay (HOSPITAL_COMMUNITY): Payer: Medicare Other

## 2011-12-06 ENCOUNTER — Inpatient Hospital Stay (HOSPITAL_COMMUNITY): Payer: Medicare Other | Admitting: Anesthesiology

## 2011-12-06 ENCOUNTER — Other Ambulatory Visit (INDEPENDENT_AMBULATORY_CARE_PROVIDER_SITE_OTHER): Payer: Self-pay | Admitting: Surgery

## 2011-12-06 ENCOUNTER — Encounter (HOSPITAL_COMMUNITY): Payer: Self-pay | Admitting: Anesthesiology

## 2011-12-06 DIAGNOSIS — K632 Fistula of intestine: Secondary | ICD-10-CM

## 2011-12-06 DIAGNOSIS — K5669 Other intestinal obstruction: Secondary | ICD-10-CM | POA: Diagnosis present

## 2011-12-06 DIAGNOSIS — Z5331 Laparoscopic surgical procedure converted to open procedure: Secondary | ICD-10-CM

## 2011-12-06 DIAGNOSIS — K56699 Other intestinal obstruction unspecified as to partial versus complete obstruction: Secondary | ICD-10-CM | POA: Diagnosis present

## 2011-12-06 DIAGNOSIS — K5732 Diverticulitis of large intestine without perforation or abscess without bleeding: Principal | ICD-10-CM | POA: Diagnosis present

## 2011-12-06 DIAGNOSIS — K219 Gastro-esophageal reflux disease without esophagitis: Secondary | ICD-10-CM | POA: Diagnosis present

## 2011-12-06 DIAGNOSIS — I499 Cardiac arrhythmia, unspecified: Secondary | ICD-10-CM | POA: Diagnosis present

## 2011-12-06 HISTORY — PX: COLON SURGERY: SHX602

## 2011-12-06 LAB — CBC
MCH: 27.6 pg (ref 26.0–34.0)
MCHC: 31.7 g/dL (ref 30.0–36.0)
MCV: 87 fL (ref 78.0–100.0)
Platelets: 500 10*3/uL — ABNORMAL HIGH (ref 150–400)
RBC: 3.84 MIL/uL — ABNORMAL LOW (ref 3.87–5.11)
RDW: 16 % — ABNORMAL HIGH (ref 11.5–15.5)

## 2011-12-06 LAB — TYPE AND SCREEN: Antibody Screen: NEGATIVE

## 2011-12-06 SURGERY — LAPAROSCOPIC PARTIAL COLECTOMY
Anesthesia: General | Site: Ureter | Wound class: Dirty or Infected

## 2011-12-06 MED ORDER — CEFOXITIN SODIUM-DEXTROSE 1-4 GM-% IV SOLR (PREMIX)
INTRAVENOUS | Status: AC
  Filled 2011-12-06: qty 50

## 2011-12-06 MED ORDER — SUFENTANIL CITRATE 50 MCG/ML IV SOLN
INTRAVENOUS | Status: DC | PRN
  Administered 2011-12-06 (×3): 10 ug via INTRAVENOUS
  Administered 2011-12-06: 5 ug via INTRAVENOUS
  Administered 2011-12-06: 15 ug via INTRAVENOUS

## 2011-12-06 MED ORDER — CEFOXITIN SODIUM 1 G IV SOLR
1.0000 g | INTRAVENOUS | Status: AC
  Administered 2011-12-06: 1 g via INTRAVENOUS

## 2011-12-06 MED ORDER — HYDROMORPHONE HCL PF 1 MG/ML IJ SOLN
INTRAMUSCULAR | Status: AC
  Filled 2011-12-06: qty 1

## 2011-12-06 MED ORDER — HEPARIN SODIUM (PORCINE) 5000 UNIT/ML IJ SOLN
5000.0000 [IU] | Freq: Three times a day (TID) | INTRAMUSCULAR | Status: DC
  Administered 2011-12-06 – 2011-12-12 (×18): 5000 [IU] via SUBCUTANEOUS
  Filled 2011-12-06 (×21): qty 1

## 2011-12-06 MED ORDER — HEPARIN SODIUM (PORCINE) 5000 UNIT/ML IJ SOLN
5000.0000 [IU] | Freq: Once | INTRAMUSCULAR | Status: AC
  Administered 2011-12-06: 5000 [IU] via SUBCUTANEOUS

## 2011-12-06 MED ORDER — MIDAZOLAM HCL 5 MG/5ML IJ SOLN
INTRAMUSCULAR | Status: DC | PRN
  Administered 2011-12-06: 2 mg via INTRAVENOUS

## 2011-12-06 MED ORDER — LACTATED RINGERS IV SOLN: INTRAVENOUS | Status: DC

## 2011-12-06 MED ORDER — ONDANSETRON HCL 4 MG/2ML IJ SOLN
INTRAMUSCULAR | Status: DC | PRN
  Administered 2011-12-06: 4 mg via INTRAVENOUS

## 2011-12-06 MED ORDER — NEOSTIGMINE METHYLSULFATE 1 MG/ML IJ SOLN
INTRAMUSCULAR | Status: DC | PRN
  Administered 2011-12-06: 4 mg via INTRAVENOUS

## 2011-12-06 MED ORDER — CISATRACURIUM BESYLATE 2 MG/ML IV SOLN
INTRAVENOUS | Status: DC | PRN
  Administered 2011-12-06: 10 mg via INTRAVENOUS
  Administered 2011-12-06: 4 mg via INTRAVENOUS
  Administered 2011-12-06: 2 mg via INTRAVENOUS

## 2011-12-06 MED ORDER — MORPHINE SULFATE 2 MG/ML IJ SOLN
0.5000 mg | INTRAMUSCULAR | Status: DC | PRN
  Administered 2011-12-06 – 2011-12-12 (×20): 0.5 mg via INTRAVENOUS
  Filled 2011-12-06 (×19): qty 1

## 2011-12-06 MED ORDER — HYDROMORPHONE HCL PF 1 MG/ML IJ SOLN
INTRAMUSCULAR | Status: DC | PRN
  Administered 2011-12-06: 1 mg via INTRAVENOUS
  Administered 2011-12-06 (×2): .5 mg via INTRAVENOUS

## 2011-12-06 MED ORDER — DIPHENHYDRAMINE HCL 50 MG/ML IJ SOLN: 12.5000 mg | Freq: Four times a day (QID) | INTRAMUSCULAR | Status: DC | PRN

## 2011-12-06 MED ORDER — PROMETHAZINE HCL 25 MG/ML IJ SOLN: 6.2500 mg | INTRAMUSCULAR | Status: DC | PRN

## 2011-12-06 MED ORDER — BUPIVACAINE LIPOSOME 1.3 % IJ SUSP
20.0000 mL | Freq: Once | INTRAMUSCULAR | Status: AC
  Administered 2011-12-06: 20 mL
  Filled 2011-12-06: qty 20

## 2011-12-06 MED ORDER — LACTATED RINGERS IV SOLN: INTRAVENOUS | Status: DC | PRN

## 2011-12-06 MED ORDER — PANTOPRAZOLE SODIUM 40 MG IV SOLR
40.0000 mg | Freq: Every day | INTRAVENOUS | Status: DC
  Administered 2011-12-06 – 2011-12-11 (×6): 40 mg via INTRAVENOUS
  Filled 2011-12-06 (×7): qty 40

## 2011-12-06 MED ORDER — DIPHENHYDRAMINE HCL 12.5 MG/5ML PO ELIX: 12.5000 mg | ORAL_SOLUTION | Freq: Four times a day (QID) | ORAL | Status: DC | PRN

## 2011-12-06 MED ORDER — PROPOFOL 10 MG/ML IV EMUL
INTRAVENOUS | Status: DC | PRN
  Administered 2011-12-06: 80 mg via INTRAVENOUS

## 2011-12-06 MED ORDER — ALVIMOPAN 12 MG PO CAPS
12.0000 mg | ORAL_CAPSULE | Freq: Once | ORAL | Status: AC
  Administered 2011-12-06: 12 mg via ORAL

## 2011-12-06 MED ORDER — LIDOCAINE HCL (CARDIAC) 20 MG/ML IV SOLN
INTRAVENOUS | Status: DC | PRN
  Administered 2011-12-06: 50 mg via INTRAVENOUS

## 2011-12-06 MED ORDER — LACTATED RINGERS IV SOLN
INTRAVENOUS | Status: DC | PRN
  Administered 2011-12-06 (×3): via INTRAVENOUS

## 2011-12-06 MED ORDER — KCL IN DEXTROSE-NACL 20-5-0.45 MEQ/L-%-% IV SOLN
INTRAVENOUS | Status: DC
  Administered 2011-12-06 – 2011-12-07 (×3): via INTRAVENOUS
  Administered 2011-12-08 (×2): 100 mL/h via INTRAVENOUS
  Administered 2011-12-09 (×2): via INTRAVENOUS
  Administered 2011-12-10: 50 mL via INTRAVENOUS
  Administered 2011-12-10 – 2011-12-11 (×2): via INTRAVENOUS
  Administered 2011-12-11: 50 mL via INTRAVENOUS
  Filled 2011-12-06 (×15): qty 1000

## 2011-12-06 MED ORDER — HYDROMORPHONE HCL PF 1 MG/ML IJ SOLN: 0.2500 mg | INTRAMUSCULAR | Status: DC | PRN

## 2011-12-06 MED ORDER — SODIUM CHLORIDE 0.9 % IR SOLN
Status: DC | PRN
  Administered 2011-12-06: 1000 mL

## 2011-12-06 MED ORDER — ONDANSETRON HCL 4 MG/2ML IJ SOLN: 4.0000 mg | Freq: Four times a day (QID) | INTRAMUSCULAR | Status: DC | PRN

## 2011-12-06 MED ORDER — GLYCOPYRROLATE 0.2 MG/ML IJ SOLN
INTRAMUSCULAR | Status: DC | PRN
  Administered 2011-12-06: .6 mg via INTRAVENOUS

## 2011-12-06 MED ORDER — HYDRALAZINE HCL 20 MG/ML IJ SOLN
INTRAMUSCULAR | Status: DC | PRN
  Administered 2011-12-06 (×2): 5 mg via INTRAVENOUS

## 2011-12-06 SURGICAL SUPPLY — 86 items
ADAPTER GOLDBERG URETERAL (ADAPTER) ×3 IMPLANT
APPLIER CLIP 5 13 M/L LIGAMAX5 (MISCELLANEOUS)
APPLIER CLIP ROT 10 11.4 M/L (STAPLE)
BAG URINE DRAINAGE (UROLOGICAL SUPPLIES) IMPLANT
BAG URO CATCHER STRL LF (DRAPE) IMPLANT
BLADE EXTENDED COATED 6.5IN (ELECTRODE) ×3 IMPLANT
BLADE HEX COATED 2.75 (ELECTRODE) ×3 IMPLANT
BLADE SURG SZ10 CARB STEEL (BLADE) ×3 IMPLANT
CABLE HIGH FREQUENCY MONO STRZ (ELECTRODE) ×3 IMPLANT
CANISTER SUCTION 2500CC (MISCELLANEOUS) ×3 IMPLANT
CATH FOLEY SILVER 30CC 28FR (CATHETERS) IMPLANT
CATH INTERMIT  6FR 70CM (CATHETERS) ×6 IMPLANT
CELLS DAT CNTRL 66122 CELL SVR (MISCELLANEOUS) ×2 IMPLANT
CLIP APPLIE 5 13 M/L LIGAMAX5 (MISCELLANEOUS) IMPLANT
CLIP APPLIE ROT 10 11.4 M/L (STAPLE) IMPLANT
CLOTH BEACON ORANGE TIMEOUT ST (SAFETY) ×3 IMPLANT
CORD HIGH FREQUENCY UNIPOLAR (ELECTROSURGICAL) ×3 IMPLANT
COVER MAYO STAND STRL (DRAPES) ×3 IMPLANT
DECANTER SPIKE VIAL GLASS SM (MISCELLANEOUS) IMPLANT
DERMABOND ADVANCED (GAUZE/BANDAGES/DRESSINGS) ×2
DERMABOND ADVANCED .7 DNX12 (GAUZE/BANDAGES/DRESSINGS) ×4 IMPLANT
DRAIN CHANNEL 19F RND (DRAIN) ×3 IMPLANT
DRAPE C-ARM 42X72 X-RAY (DRAPES) ×3 IMPLANT
DRAPE LAPAROSCOPIC ABDOMINAL (DRAPES) ×3 IMPLANT
DRAPE LG THREE QUARTER DISP (DRAPES) ×3 IMPLANT
DRAPE WARM FLUID 44X44 (DRAPE) ×3 IMPLANT
ELECT REM PT RETURN 9FT ADLT (ELECTROSURGICAL) ×3
ELECTRODE REM PT RTRN 9FT ADLT (ELECTROSURGICAL) ×2 IMPLANT
FILTER SMOKE EVAC LAPAROSHD (FILTER) IMPLANT
GAUZE SPONGE 4X4 12PLY STRL LF (GAUZE/BANDAGES/DRESSINGS) ×3 IMPLANT
GLOVE BIOGEL PI IND STRL 7.0 (GLOVE) ×2 IMPLANT
GLOVE BIOGEL PI INDICATOR 7.0 (GLOVE) ×1
GLOVE ECLIPSE 8.0 STRL XLNG CF (GLOVE) ×6 IMPLANT
GLOVE INDICATOR 8.0 STRL GRN (GLOVE) ×3 IMPLANT
GOWN STRL NON-REIN LRG LVL3 (GOWN DISPOSABLE) ×12 IMPLANT
GOWN STRL REIN XL XLG (GOWN DISPOSABLE) ×6 IMPLANT
GRASPER LAPSCPC 5X35 EPIX (ENDOMECHANICALS) IMPLANT
GUIDEWIRE STR DUAL SENSOR (WIRE) ×3 IMPLANT
HAND ACTIVATED (MISCELLANEOUS) ×3 IMPLANT
KIT BASIN OR (CUSTOM PROCEDURE TRAY) ×3 IMPLANT
LEGGING LITHOTOMY PAIR STRL (DRAPES) ×3 IMPLANT
LIGASURE IMPACT 36 18CM CVD LR (INSTRUMENTS) ×9 IMPLANT
NS IRRIG 1000ML POUR BTL (IV SOLUTION) ×3 IMPLANT
PENCIL BUTTON HOLSTER BLD 10FT (ELECTRODE) ×3 IMPLANT
PUMP PAIN ON-Q (MISCELLANEOUS) ×3 IMPLANT
RTRCTR WOUND ALEXIS 18CM MED (MISCELLANEOUS) ×3
SCISSORS LAP 5X35 DISP (ENDOMECHANICALS) ×3 IMPLANT
SET CYSTO W/LG BORE CLAMP LF (SET/KITS/TRAYS/PACK) ×3 IMPLANT
SET IRRIG TUBING LAPAROSCOPIC (IRRIGATION / IRRIGATOR) ×3 IMPLANT
SLEEVE Z-THREAD 5X100MM (TROCAR) IMPLANT
SOLUTION ANTI FOG 6CC (MISCELLANEOUS) ×3 IMPLANT
SPONGE GAUZE 4X4 12PLY (GAUZE/BANDAGES/DRESSINGS) ×3 IMPLANT
SPONGE LAP 18X18 X RAY DECT (DISPOSABLE) ×6 IMPLANT
STAPLER CUT CVD 40MM GREEN (STAPLE) ×3 IMPLANT
STAPLER CUT RELOAD GREEN (STAPLE) ×3 IMPLANT
STAPLER VISISTAT 35W (STAPLE) ×3 IMPLANT
SUCTION POOLE TIP (SUCTIONS) ×3 IMPLANT
SUT NOVA 1 T20/GS 25DT (SUTURE) ×9 IMPLANT
SUT PDS AB 1 CTX 36 (SUTURE) ×6 IMPLANT
SUT PDS AB 1 TP1 96 (SUTURE) IMPLANT
SUT PROLENE 2 0 CT2 30 (SUTURE) ×3 IMPLANT
SUT PROLENE 2 0 KS (SUTURE) IMPLANT
SUT SILK 2 0 (SUTURE) ×1
SUT SILK 2 0 SH CR/8 (SUTURE) ×3 IMPLANT
SUT SILK 2-0 18XBRD TIE 12 (SUTURE) ×2 IMPLANT
SUT SILK 3 0 (SUTURE) ×1
SUT SILK 3 0 SH CR/8 (SUTURE) ×3 IMPLANT
SUT SILK 3-0 18XBRD TIE 12 (SUTURE) ×2 IMPLANT
SUT VIC AB 2-0 SH 18 (SUTURE) ×6 IMPLANT
SUT VICRYL 2 0 18  UND BR (SUTURE) ×2
SUT VICRYL 2 0 18 UND BR (SUTURE) ×4 IMPLANT
SYR 30ML LL (SYRINGE) IMPLANT
SYRINGE IRR TOOMEY STRL 70CC (SYRINGE) IMPLANT
SYS LAPSCP GELPORT 120MM (MISCELLANEOUS)
SYSTEM LAPSCP GELPORT 120MM (MISCELLANEOUS) IMPLANT
TAPE CLOTH SURG 4X10 WHT LF (GAUZE/BANDAGES/DRESSINGS) ×3 IMPLANT
TOWEL OR 17X26 10 PK STRL BLUE (TOWEL DISPOSABLE) ×9 IMPLANT
TRAY FOLEY CATH 14FRSI W/METER (CATHETERS) ×3 IMPLANT
TRAY LAP CHOLE (CUSTOM PROCEDURE TRAY) ×3 IMPLANT
TROCAR BLADELESS OPT 5 75 (ENDOMECHANICALS) ×6 IMPLANT
TROCAR Z-THREAD FIOS 11X100 BL (TROCAR) IMPLANT
TROCAR Z-THREAD FIOS 5X100MM (TROCAR) ×6 IMPLANT
TROCAR Z-THREAD SLEEVE 11X100 (TROCAR) IMPLANT
TUBING FILTER THERMOFLATOR (ELECTROSURGICAL) ×3 IMPLANT
YANKAUER SUCT BULB TIP 10FT TU (MISCELLANEOUS) ×3 IMPLANT
YANKAUER SUCT BULB TIP NO VENT (SUCTIONS) ×3 IMPLANT

## 2011-12-06 NOTE — Anesthesia Preprocedure Evaluation (Addendum)
Anesthesia Evaluation  Patient identified by MRN, date of birth, ID band Patient awake    Reviewed: Allergy & Precautions, H&P , NPO status , Patient's Chart, lab work & pertinent test results  Airway Mallampati: II TM Distance: >3 FB Neck ROM: full    Dental  (+) Edentulous Upper and Edentulous Lower Has a few teeth on bottom on sides.  None loose:   Pulmonary neg pulmonary ROS,  clear to auscultation  Pulmonary exam normal       Cardiovascular Exercise Tolerance: Good hypertension, + Past MI and neg cardio ROS + dysrhythmias regular Normal Patient told she had MI but not aware of it.  No CP.  No Q wave on current ECG. ?  No meds for HTN.  Running around 150 systolic, according to her.  Dont know anything about dysrythmia and patient does not either.   Neuro/Psych Negative Neurological ROS  Negative Psych ROS   GI/Hepatic negative GI ROS, Neg liver ROS, PUD, GERD-  ,  Endo/Other  Negative Endocrine ROS  Renal/GU negative Renal ROS  Genitourinary negative   Musculoskeletal   Abdominal   Peds  Hematology negative hematology ROS (+) Anemia Hgb 10   Anesthesia Other Findings   Reproductive/Obstetrics negative OB ROS                          Anesthesia Physical Anesthesia Plan  ASA: III  Anesthesia Plan: General   Post-op Pain Management:    Induction: Intravenous  Airway Management Planned: Oral ETT  Additional Equipment:   Intra-op Plan:   Post-operative Plan: Extubation in OR  Informed Consent: I have reviewed the patients History and Physical, chart, labs and discussed the procedure including the risks, benefits and alternatives for the proposed anesthesia with the patient or authorized representative who has indicated his/her understanding and acceptance.   Dental Advisory Given  Plan Discussed with: CRNA and Surgeon  Anesthesia Plan Comments:         Anesthesia Quick  Evaluation

## 2011-12-06 NOTE — Op Note (Signed)
Surgeon: Wenda Low, MD, FACS  Asst:  P.J. Carolynne Edouard, MD, FACS   Anes:  general  Procedure: Laparoscopy, open sigmoid colectomy with mobilization of the splenic flexure and descending colostomy, Hartmann pouch with Prolene marker, takedown enterocolic fistula, stent placement by Dr. Vernie Ammons  Diagnosis: Advance sigmoid diverticulitis, perforation and stricture with enterocolic fistula Complications: none  EBL:   25 cc  Description of Procedure:  This 77 year old AA lady was taken to the OR #11 on Friday, 12/06/2011. After general anesthesia was administered patient was placed in the dorsal lithotomy position and was prepped with PCMX and draped sterilely. A Foley catheter was inserted and a timeout was performed. Entered the abdomen through the right upper quadrant using an Optiview technique entered into some adhesions was able to insufflate the abdomen and placed 2 other 5 mm trochars in the lower abdomen. I visualized side and right upper quadrant and did not injure anything in that insertion the with these 3 port sites all 5 mm I began to mobilize and understand anatomy. The descending colon was markedly dilated and distally the colon was not distended. I found the left colon densely adherent up near the pelvis in the involving the first portion of the sigmoid colon. She did not have real long redundant sigmoid colon. The colon was so stuck that I could not even move it with the laparoscopic instruments. I therefore made in midline incision and entered the abdomen. A wound protector was placed. Down the colon to be very densely adherent and stuck as bad as ever encountered. You sharp dissection encountered a the small bowel to colon fistula. Taken down sharply and repaired the opening in the colon with interrupted 3-0 silk sutures. Then the chisel the colon office left pelvic sidewall. It did not appear to be cancer but was very significantly hardened. Elected then to go distally and divided sigmoid  with the contour stapler green load area and I went through the mesentery with the LigaSure were and ultimately got this freed. Divided it proximally with some difficulty because the of the massive distention of the proximal colon. Once these were divided I requested Dr. Vernie Ammons to come in and cystoscope the patient and pass stents to  make sure everything was okay. This was done.  Next I used a harmonic scalpel and did a splenic flexure mobilization to bring this into the midline and low enough to bring out as a colostomy.  A 2-0 Prolene was placed on the Hartmann pouch remnant.   Without mobilization I would not have been able to get the colon to its position in the colostomy in the left lower quadrant.  I then changed gloves and closed the abdomen with figure-of-eight sutures of #1 Novafil. The wound was irrigated with copious amounts of saline and closed with staples 2 which Dermabond was applied. Prior to closure the sponge and needle counts were correct and I made a defect in the left lower quadrant wall and brought out the end colostomy. Her was no spillage in the procedure but there was a little stool located on the staple line of the ostomy when I brought it out.  The ostomy was then matured with 3-0 chromic running locking sutures from either end. From a finger was able to deflate the proximal colon and found to have a lot of liquid green stool dictating an inadequate prep. Patient was taken to the recovery room in satisfactory condition and will be transferred to step down postop.  Matt B.  Hassell Done, [place], [facility], [place]

## 2011-12-06 NOTE — Consult Note (Signed)
  PATIENT:  Samantha Abbott  PRE-OPERATIVE DIAGNOSIS: Abdominal mass  POST-OPERATIVE DIAGNOSIS:  Same  PROCEDURE:  Procedure(s): Cystoscopy with fluoroscopically guided ureteral stent placement bilaterally  SURGEON:  [place]  INDICATION: The patient was seen as an intraoperative consultation. She is an 77 year old female who has a mass in the pelvis that was biopsied and found to be most likely benign but inflammatory in nature. It was located on the left side near the course of the left ureter. There was a great deal of inflammatory reaction and in order to protect the ureters Dr. Daphine Deutscher contacted me for placement of ureteral stents intraoperatively.  ANESTHESIA:  General  EBL:  None  DRAINS: 6 [place] open-ended ureteral catheters in the right and left ureter and a 16 French Foley catheter in the bladder  Description of procedure: The patient was seen intraoperatively and I was able to visualize the area where the previous lesion had been resected. It was fairly near the location/course of the left ureter but I was not able to visualize any injury to the ureter or urine leaking into the peritoneal cavity.  Cystoscopy was performed by first removing the Foley catheter and then I inserted a 22 French cystoscope with 12 lens. The bladder was fully and systematically inspected and noted to be free of any tumors, stones or inflammatory lesions. The mucosa was intact throughout. There was no evidence of any injury to the bladder mucosa. I could see no blood coming from either ureteral orifice.  A 6 French open-ended ureteral catheter was then passed through the cystoscope and into the left ureteral orifice. I then passed a 0.038 inch floppy-tipped guidewire through the open ended catheter and into the area of the left renal pelvis under direct fluoroscopic control. The stent passed easily into the renal pelvis. I therefore passed the open-ended catheter over the stent and this also  passed very easily into the area the renal pelvis. I removed the guidewire and the stent was then connected to closed system drainage. An identical procedure was then performed on the right-hand side with no abnormal findings having been noted on that side either. After both stents were connected to closed system drainage a new 16 French Foley catheter was inserted in the bladder and this was connected to closed system drainage as well.  The patient tolerated this portion of the procedure well no intraoperative complications. There is no indication of injury to the ureters. I felt removal of the open-ended ureteral catheters could be performed at the end of the procedure without further followup necessary and will remain available for any further urologic assistance should it be needed.

## 2011-12-06 NOTE — H&P (Addendum)
  Valarie Merino, MD 10/31/2011 10:21 AM Signed  Subjective:   Patient ID: Dwan Bolt, female DOB: [birth year], 77 y.o. MRN: [id number]  HPI Mrs. Cassell comes in today with her biopsy report from Dr. Ewing Schlein which was benign but was taken at a stricture in the sigmoid colon around 35 cm. Hopefully this is just a diverticular stricture but a colon cancer in her cannot be ruled out. I described laparoscopically assisted sigmoid colectomy to her in some detail. I mentioned that there is always a chance of temporary colostomy. I did a laparoscopic cholecystectomy on her so she is familiar with this technique. We will need to keep her on a liquid diet until her surgery and do a 2 day bowel prep. However like to get her cardiologist notes and get cardiac clearance before proceeding.  Patient Active Problem List   Diagnoses   .  Stricture of colon   .  GERD (gastroesophageal reflux disease)   .  Irregular heartbeat    Past Medical History   Diagnosis  Date   .  Abnormal heart rhythm    .  Hypertension    .  Myocardial infarction    .  GERD (gastroesophageal reflux disease)    .  Nausea    .  Vomiting    .  Arthritis    .  Iron deficiency anemia    .  Abdominal pain    .  Esophageal ulcer    .  Flatulence, eructation, and gas pain    SPRUILL,JEROME O, MD  No Known Allergies  No current outpatient prescriptions on file.    Past Surgical History   Procedure  Date   .  Abdominal hysterectomy    .  Cholecystectomy    .  Foot surgery    .  Colonoscopy     Review of Systems positive for GERD, Abominal distention, anemia   Objective:   Physical Exam.well-developed well-nourished pain elderly African American lady in no acute distress  Filed Vitals:    10/31/11 0933   BP:  186/82   Pulse:  64   Temp:  96.4 F (35.8 C)   Resp:  18   head normocephalic, neck no bruits or masses, chest clear to auscultation, heart irregularly irregular rhythm suggestive of atrial fibrillation, no  murmurs.  Abdomen nontender with mild distention. Healed laparoscopy scars. Pelvic exam not performed  Extremities full range of motion, ambulatory. Neuro alert and oriented x3, motor and sensory function are grossly intact. Psych appropriate affect and apparent fund of knowledge. Cognition appropriate for age.   Assessment:    Colonic stricture at 35 cm,   Plan:    Laparoscopically assisted sigmoid colectomy at [facility] long under general anesthesia.  We'll get cardiac clearance from Dr. Sharyn Lull     She has completed her bowel prep.  All questions answered. There has been no change in the patient's past medical history or physical exam in the past 24 hours to the best of my knowledge.  Expectations and outcome results have been discussed with the patient to include risks and benefits.  All questions have been answered and will proceed with previously discussed procedure noted and signed in the consent form in the patient's record.    Adeola Dennen BMD @NOW  12/06/2011

## 2011-12-06 NOTE — Anesthesia Postprocedure Evaluation (Signed)
  Anesthesia Post-op Note  Patient: Samantha Abbott  Procedure(s) Performed:  LAPAROSCOPIC PARTIAL COLECTOMY - Sigmoid Stricture.  place in dorsal lithotomy laproscropic converted to open partial colectomy with colostomy; CYSTOSCOPY WITH STENT PLACEMENT  Patient Location: PACU  Anesthesia Type: General  Level of Consciousness: awake and alert   Airway and Oxygen Therapy: Patient Spontanous Breathing  Post-op Pain: mild  Post-op Assessment: Post-op Vital signs reviewed, Patient's Cardiovascular Status Stable, Respiratory Function Stable, Patent Airway and No signs of Nausea or vomiting  Post-op Vital Signs: stable  Complications: No apparent anesthesia complications

## 2011-12-06 NOTE — Transfer of Care (Signed)
Immediate Anesthesia Transfer of Care Note  Patient: Samantha Abbott  Procedure(s) Performed:  LAPAROSCOPIC PARTIAL COLECTOMY - Sigmoid Stricture.  place in dorsal lithotomy laproscropic converted to open partial colectomy with colostomy; CYSTOSCOPY WITH STENT PLACEMENT  Patient Location: PACU  Anesthesia Type: General  Level of Consciousness: awake, alert , sedated and patient cooperative  Airway & Oxygen Therapy: Patient Spontanous Breathing and Patient connected to face mask oxygen  Post-op Assessment: Report given to PACU RN, Post -op Vital signs reviewed and stable and Patient moving all extremities X 4  Post vital signs: stable  Complications: No apparent anesthesia complications

## 2011-12-07 LAB — CBC
Hemoglobin: 9.8 g/dL — ABNORMAL LOW (ref 12.0–15.0)
MCHC: 32.5 g/dL (ref 30.0–36.0)
RDW: 16.1 % — ABNORMAL HIGH (ref 11.5–15.5)

## 2011-12-07 LAB — COMPREHENSIVE METABOLIC PANEL
ALT: 6 U/L (ref 0–35)
Alkaline Phosphatase: 83 U/L (ref 39–117)
BUN: 9 mg/dL (ref 6–23)
CO2: 25 mEq/L (ref 19–32)
Calcium: 8.6 mg/dL (ref 8.4–10.5)
GFR calc Af Amer: 67 mL/min — ABNORMAL LOW (ref >90)
GFR calc non Af Amer: 58 mL/min — ABNORMAL LOW (ref >90)
Glucose, Bld: 168 mg/dL — ABNORMAL HIGH (ref 70–99)
Sodium: 135 mEq/L (ref 135–145)

## 2011-12-07 NOTE — Progress Notes (Signed)
Patient ID: Samantha Abbott, female   DOB: [birth year], 77 y.o.   MRN: [id number] [facility] Surgery Progress Note:   1 Day Post-Op  Subjective: Having some pain.  Alert and oriented.  appropriate Objective: Vital signs in last 24 hours: Temp:  [97 F (36.1 C)-99.1 F (37.3 C)] 99.1 F (37.3 C) (12/15 0400) Pulse Rate:  [73-103] 79  (12/15 0400) Resp:  [18-25] 24  (12/15 0400) BP: (126-187)/(31-76) 152/56 mmHg (12/15 0400) SpO2:  [91 %-100 %] 99 % (12/15 0400) Weight:  [129 lb 10.1 oz (58.8 kg)-132 lb 4.4 oz (60 kg)] 129 lb 10.1 oz (58.8 kg) (12/15 0400)  Intake/Output from previous day: 12/14 0701 - 12/15 0700 In: 4500 [I.V.:4500] Out: 1470 [Urine:1170; Stool:50; Blood:250] Intake/Output this shift:    Physical Exam:  Ostomy in place. Urine has old blood in Foley tubing Lab Results:   Basename 12/07/11 0312 12/06/11 1348  WBC 20.8* 20.4*  HGB 9.8* 10.6*  HCT 30.2* 33.4*  PLT 493* 500*   BMET  Basename 12/07/11 0312 12/06/11 1348  NA 135 --  K 5.0 --  CL 104 --  CO2 25 --  GLUCOSE 168* --  BUN 9 --  CREATININE 0.89 0.76  CALCIUM 8.6 --   PT/INR No results found for this basename: LABPROT:2,INR:2 in the last 72 hours Studies/Results: Dg C-arm 1-60 Min-no Report  12/06/2011  CLINICAL DATA: intraop   C-ARM 1-60 MINUTES  Fluoroscopy was utilized by the requesting physician.  No radiographic  interpretation.     Anti-infectives: Anti-infectives     Start     Dose/Rate Route Frequency Ordered Stop   12/06/11 0515   cefOXitin (MEFOXIN) 1 g in dextrose 5 % 50 mL IVPB        1 g 100 mL/hr over 30 Minutes Intravenous 60 min pre-op 12/06/11 4098 12/06/11 0753          Assessment/Plan: Problem List: Patient Active Problem List  Diagnoses  . Stricture of colon  . GERD (gastroesophageal reflux disease)  . Irregular heartbeat    Hemodynamically stable.  Will keep Foley to assess bloody urine.  Transfer to 5W 1 Day Post-Op    LOS: 1 day   Matt B.  Daphine Deutscher, MD, [facility] [phone] beeper [phone]  12/07/2011 7:57 AM

## 2011-12-08 NOTE — Progress Notes (Signed)
Patient ID: Samantha Abbott, female   DOB: 01-15-1925, 77 y.o.   MRN: [id number] [facility] Surgery Progress Note:   2 Days Post-Op  Subjective: Feeling better.   Objective: Vital signs in last 24 hours: Temp:  [98.3 F (36.8 C)-99.8 F (37.7 C)] 99.5 F (37.5 C) (12/16 0535) Pulse Rate:  [77-81] 77  (12/16 0535) Resp:  [18-20] 20  (12/16 0535) BP: (146-177)/(63-84) 146/84 mmHg (12/16 0535) SpO2:  [96 %-99 %] 97 % (12/16 0535)  Intake/Output from previous day: 12/15 0701 - 12/16 0700 In: 2415 [I.V.:2415] Out: 2245 [Urine:2215; Stool:30] Intake/Output this shift: Total I/O In: -  Out: 900 [Urine:900]  Physical Exam:  Ostomy pink Lab Results:   [facility] 12/07/11 0312 12/06/11 1348  WBC 20.8* 20.4*  HGB 9.8* 10.6*  HCT 30.2* 33.4*  PLT 493* 500*   BMET  Basename 12/07/11 0312 12/06/11 1348  NA 135 --  K 5.0 --  CL 104 --  CO2 25 --  GLUCOSE 168* --  BUN 9 --  CREATININE 0.89 0.76  CALCIUM 8.6 --   PT/INR No results found for this basename: LABPROT:2,INR:2 in the last 72 hours Studies/Results: No results found. Anti-infectives: Anti-infectives     Start     Dose/Rate Route Frequency Ordered Stop   12/06/11 0515   cefOXitin (MEFOXIN) 1 g in dextrose 5 % 50 mL IVPB        1 g 100 mL/hr over 30 Minutes Intravenous 60 min pre-op 12/06/11 6578 12/06/11 0753          Assessment/Plan: Problem List: Patient Active Problem List  Diagnoses  . Stricture of colon  . GERD (gastroesophageal reflux disease)  . Irregular heartbeat    Will dc foley and keep on clear liquids 2 Days Post-Op    LOS: 2 days   Matt B. Daphine Deutscher, MD, [facility] [phone] beeper [phone]  12/08/2011 12:42 PM

## 2011-12-09 NOTE — Progress Notes (Signed)
Patient ID: Samantha Abbott, female   DOB: 03/15/1925, 77 y.o.   MRN: [id number] [facility] Surgery Progress Note:   3 Days Post-Op  Subjective: Wants more to eat.  No complaints Objective: Vital signs in last 24 hours: Temp:  [98 F (36.7 C)-99.1 F (37.3 C)] 98 F (36.7 C) (12/17 0407) Pulse Rate:  [69-78] 69  (12/17 0407) Resp:  [18-20] 20  (12/17 0407) BP: (161-179)/(68-80) 172/71 mmHg (12/17 0407) SpO2:  [95 %-97 %] 97 % (12/17 0407)  Intake/Output from previous day: 12/16 0701 - 12/17 0700 In: 2563 [P.O.:143; I.V.:2420] Out: 3325 [Urine:3325] Intake/Output this shift: Total I/O In: -  Out: 350 [Urine:350]  Physical Exam:  Ostomy pink with brown stool Lab Results:   [facility] 12/07/11 0312 12/06/11 1348  WBC 20.8* 20.4*  HGB 9.8* 10.6*  HCT 30.2* 33.4*  PLT 493* 500*   BMET  Basename 12/07/11 0312 12/06/11 1348  NA 135 --  K 5.0 --  CL 104 --  CO2 25 --  GLUCOSE 168* --  BUN 9 --  CREATININE 0.89 0.76  CALCIUM 8.6 --   PT/INR No results found for this basename: LABPROT:2,INR:2 in the last 72 hours Studies/Results: No results found. Anti-infectives: Anti-infectives     Start     Dose/Rate Route Frequency Ordered Stop   12/06/11 0515   cefOXitin (MEFOXIN) 1 g in dextrose 5 % 50 mL IVPB        1 g 100 mL/hr over 30 Minutes Intravenous 60 min pre-op 12/06/11 4098 12/06/11 0753          Assessment/Plan: Problem List: Patient Active Problem List  Diagnoses  . Stricture of colon  . GERD (gastroesophageal reflux disease)  . Irregular heartbeat    Doing well thus far.  Advance to full liquids 3 Days Post-Op    LOS: 3 days   Matt B. Daphine Deutscher, MD, [facility] [phone] beeper [phone]  12/09/2011 9:38 AM

## 2011-12-10 NOTE — Progress Notes (Signed)
Patient ID: Samantha Abbott, female   DOB: 1925/01/08, 77 y.o.   MRN: [id number] [facility] Surgery Progress Note:   4 Days Post-Op  Subjective: Tolerating full liquids Objective: Vital signs in last 24 hours: Temp:  [98.6 F (37 C)-99.1 F (37.3 C)] 98.7 F (37.1 C) (12/18 0600) Pulse Rate:  [67-69] 67  (12/18 0600) Resp:  [18] 18  (12/18 0600) BP: (165-179)/(67-80) 165/80 mmHg (12/18 0600) SpO2:  [92 %-97 %] 97 % (12/18 0600)  Intake/Output from previous day: 12/17 0701 - 12/18 0700 In: 1686.7 [P.O.:280; I.V.:1406.7] Out: 2150 [Urine:2150] Intake/Output this shift:    Physical Exam:  Ostomy pink with some output Lab Results:  No results found for this basename: WBC:2,HGB:2,HCT:2,PLT:2 in the last 72 hours BMET No results found for this basename: NA:2,K:2,CL:2,CO2:2,GLUCOSE:2,BUN:2,CREATININE:2,CALCIUM:2 in the last 72 hours PT/INR No results found for this basename: LABPROT:2,INR:2 in the last 72 hours Studies/Results: No results found. Anti-infectives: Anti-infectives     Start     Dose/Rate Route Frequency Ordered Stop   12/06/11 0515   cefOXitin (MEFOXIN) 1 g in dextrose 5 % 50 mL IVPB        1 g 100 mL/hr over 30 Minutes Intravenous 60 min pre-op 12/06/11 7846 12/06/11 0753          Assessment/Plan: Problem List: Patient Active Problem List  Diagnoses  . Stricture of colon  . GERD (gastroesophageal reflux disease)  . Irregular heartbeat    Will advance to solids .  Hopeful discharge tomorrow 4 Days Post-Op    LOS: 4 days   Matt B. Daphine Deutscher, MD, [facility] [phone] beeper [phone]  12/10/2011 7:42 AM

## 2011-12-11 NOTE — Progress Notes (Signed)
12/11/11, Kathi Der RNC-MNN, BSN, [phone].  CM received referral.  CM spoke to pt and offered choice for [facility] services.  Pt states that she has used [facility] before and would like to use them again.  Tiffany at [facility] contacted with referral for [facility] RN.  Pt states that she will have help at home.  Will follow for additional orders.

## 2011-12-11 NOTE — Progress Notes (Signed)
Patient ID: Samantha Abbott, female   DOB: [birth year], 77 y.o.   MRN: [id number] [facility] Surgery Progress Note:   5 Days Post-Op  Subjective: Struggling a little with solids.  Ostomy teaching today Objective: Vital signs in last 24 hours: Temp:  [98.6 F (37 C)-99.3 F (37.4 C)] 98.6 F (37 C) (12/19 0600) Pulse Rate:  [66-68] 67  (12/19 0600) Resp:  [18-20] 18  (12/19 0600) BP: (132-152)/(75-86) 152/80 mmHg (12/19 0600) SpO2:  [97 %-100 %] 97 % (12/19 0600)  Intake/Output from previous day: 12/18 0701 - 12/19 0700 In: 1705.5 [P.O.:540; I.V.:1165.5] Out: 970 [Urine:950; Stool:20] Intake/Output this shift:    Physical Exam:  Ostomy OK.  Staples in place Lab Results:  No results found for this basename: WBC:2,HGB:2,HCT:2,PLT:2 in the last 72 hours BMET No results found for this basename: NA:2,K:2,CL:2,CO2:2,GLUCOSE:2,BUN:2,CREATININE:2,CALCIUM:2 in the last 72 hours PT/INR No results found for this basename: LABPROT:2,INR:2 in the last 72 hours Studies/Results: No results found. Anti-infectives: Anti-infectives     Start     Dose/Rate Route Frequency Ordered Stop   12/06/11 0515   cefOXitin (MEFOXIN) 1 g in dextrose 5 % 50 mL IVPB        1 g 100 mL/hr over 30 Minutes Intravenous 60 min pre-op 12/06/11 1191 12/06/11 0753          Assessment/Plan: Problem List: Patient Active Problem List  Diagnoses  . Stricture of colon  . GERD (gastroesophageal reflux disease)  . Irregular heartbeat    Doing well.  Hopeful ready for discharge tomorrow 5 Days Post-Op    LOS: 5 days   Matt B. Daphine Deutscher, MD, [facility] [phone] beeper [phone]  12/11/2011 8:05 AM

## 2011-12-11 NOTE — Consult Note (Signed)
WOC ostomy consult  Stoma type/location: left lower quadrant colostomy (temporary) Stomal assessment/size: 1 and 5/8 ionches round; red stoma with dried blood and slogh partially obscuring viable stoma Peristomal assessment: clear Treatment options for stomal/peristomal skin: none required Output thin, dark brown stool Ostomy pouching: 2pc. Pouching system.  Skin barrier  [place] [phone]; Pouch [place] [phone] Education provided: Patient instructed on basic A&P, stoma and pouch characteristics.  Demonstrated pouch change and pouch emptying.  Patient able to give return demonstration of pouch closure Scientist, research (medical)).  Staff to reinforce today until independent in emptying.  HHRN has been requested for continued ostomy teaching and pouch changes twice weekly (Tuesday and Friday).  Educational booklet left at bedside yesterday and patient has been reviewing.   Ladona Mow, MSN, RN, GNP, CWOCN [phone])

## 2011-12-12 MED ORDER — HYDROCODONE-ACETAMINOPHEN 5-500 MG PO TABS: 1.0000 | ORAL_TABLET | Freq: Every day | ORAL | Status: DC

## 2011-12-12 NOTE — Progress Notes (Signed)
Patient discharged home with family. States understanding of discharge instructions.  Home Health to follow for ostomy changes.

## 2011-12-12 NOTE — Discharge Summary (Signed)
Physician Discharge Summary  Patient ID: Samantha Abbott MRN: [id number] DOB/AGE: 75-01-1925 75 y.o.  Admit date: 12/06/2011 Discharge date: 12/12/2011  Admission Diagnoses:  Discharge Diagnoses:  Active Problems:  Stricture of sigmoid colon-diverticulitis-post resection Hartmann procedure 12/12   Discharged Condition: good  Hospital Course: had surgery and started having ostomy output.  Tolerated diet and advanced.  VSS afebrile Ostomy care instructions given.   Consults: none  Significant Diagnostic Studies: none  Treatments: surgery: Hartmann procedure  Discharge Exam: Blood pressure 155/75, pulse 71, temperature 97.8 F (36.6 C), temperature source Oral, resp. rate 18, height 5\' 2"  (1.575 m), weight 129 lb 10.1 oz (58.8 kg), SpO2 97.00%. staples out.  incison ok  Disposition: Home or Self Care  Discharge Orders    Future Appointments: Provider: Department: Dept Phone: Center:   12/20/2011 2:30 PM Valarie Merino, MD [facility] [phone] None     Future Orders Please Complete By Expires   Diet - low sodium heart healthy      Increase activity slowly      Discharge instructions      Comments:   Ostomy care     Medication List  As of 12/12/2011  8:12 AM   START taking these medications         HYDROcodone-acetaminophen 5-500 MG per tablet   Commonly known as: VICODIN   Take 1 tablet by mouth at bedtime.          Where to get your medications    These are the prescriptions that you need to pick up.   You may get these medications from any pharmacy.         HYDROcodone-acetaminophen 5-500 MG per tablet           Follow-up Information    Follow up with [name], MD in 3 weeks.   Contact information:   [employer] [facility] [address] [place] [address] [phone]          Signed: Valarie Merino 12/12/2011, 8:12 AM

## 2011-12-12 NOTE — Progress Notes (Signed)
12/12/11, Wenda Vanschaick RNC-MNN, BSN, [phone], CM received referral.  CM spoke to pt. and offered choice.  Pt states that she is currently active with [facility] and would like to continue their services.  Vicki at [facility] notified of HH RN needs upon d/c for possible wound care/medication monitoring per order received.  Will follow.  

## 2011-12-16 ENCOUNTER — Telehealth (INDEPENDENT_AMBULATORY_CARE_PROVIDER_SITE_OTHER): Payer: Self-pay | Admitting: General Surgery

## 2011-12-16 NOTE — Telephone Encounter (Signed)
Pt called in stating that the incision site had "milky" drainage from site the size of a dime, the area was not red and patient does not have fever or other signs of infection. Pt stated that her home health nurse was there on Saturday who advised the area was not infected and how to care for the wound. Pt confirmed that the nurse was coming back to her home today to assess the site. Advised patient that based on the information she provided the appointment for 12/20/11 remains the same. I advised the patient the home health nurse has to contact our office with status updates.  As I was typing the information above, Erie Noe from [facility] called stated the patient has a staple still at site. She wanted to know if it can be removed or if it was supposed to remain at site. Erie Noe also wanted to know if physical therapy can be approved. She stated patient had mild bloody drainage from the site and they used dry dressing for the site. Per Erie Noe the site does not show sign of infection. She requested a call back on [phone] to advise if therapy can proceed.

## 2011-12-20 ENCOUNTER — Telehealth (INDEPENDENT_AMBULATORY_CARE_PROVIDER_SITE_OTHER): Payer: Self-pay | Admitting: Surgery

## 2011-12-20 ENCOUNTER — Encounter (INDEPENDENT_AMBULATORY_CARE_PROVIDER_SITE_OTHER): Payer: Self-pay | Admitting: Surgery

## 2011-12-20 ENCOUNTER — Ambulatory Visit (INDEPENDENT_AMBULATORY_CARE_PROVIDER_SITE_OTHER): Payer: Medicare Other | Admitting: Surgery

## 2011-12-20 VITALS — BP 126/80 | HR 68 | Temp 97.8°F | Resp 16 | Ht 61.0 in | Wt 112.0 lb

## 2011-12-20 DIAGNOSIS — K56609 Unspecified intestinal obstruction, unspecified as to partial versus complete obstruction: Secondary | ICD-10-CM

## 2011-12-20 DIAGNOSIS — K56699 Other intestinal obstruction unspecified as to partial versus complete obstruction: Secondary | ICD-10-CM

## 2011-12-20 NOTE — Patient Instructions (Signed)
Continue home health nursing

## 2011-12-20 NOTE — Progress Notes (Signed)
Samantha Abbott 77 y.o.  Body mass index is 21.16 kg/(m^2).  Patient Active Problem List  Diagnoses  . Stricture of sigmoid colon-diverticulitis-post resection Hartmann procedure 12/12  . GERD (gastroesophageal reflux disease)  . Irregular heartbeat    No Known Allergies  Past Surgical History  Procedure Date  . Abdominal hysterectomy   . Cholecystectomy   . Foot surgery   . Colonoscopy   . Colon surgery 12/06/11    lap sigmoid colectomy    SPRUILL,JEROME O, MD No diagnosis found.  One remaining staple removed.  Wound is open up near umbilicus.  Beefy red.   Return 3-4 weeks.  Plan ostomy takedown 3 months from [place] procedure [place] B. Daphine Deutscher, MD, [facility] [phone] beeper [phone]  12/20/2011 2:50 PM

## 2012-01-14 ENCOUNTER — Telehealth (INDEPENDENT_AMBULATORY_CARE_PROVIDER_SITE_OTHER): Payer: Self-pay

## 2012-01-14 NOTE — Telephone Encounter (Signed)
[facility] received order for ostomy supplies and  called to verify pt is followed by Dr Daphine Deutscher and does have need for ostomy supplies. Advised pt does have condition that would warrant ostomy supplies and Dr Daphine Deutscher did surgery pt surgery. They will fax request to Dr Daphine Deutscher to sign.

## 2012-01-23 ENCOUNTER — Ambulatory Visit (INDEPENDENT_AMBULATORY_CARE_PROVIDER_SITE_OTHER): Payer: Medicare Other | Admitting: Surgery

## 2012-01-23 ENCOUNTER — Encounter (INDEPENDENT_AMBULATORY_CARE_PROVIDER_SITE_OTHER): Payer: Self-pay | Admitting: Surgery

## 2012-01-23 VITALS — BP 148/82 | HR 60 | Temp 97.8°F | Resp 12 | Ht 61.0 in | Wt 127.2 lb

## 2012-01-23 DIAGNOSIS — K56609 Unspecified intestinal obstruction, unspecified as to partial versus complete obstruction: Secondary | ICD-10-CM

## 2012-01-23 DIAGNOSIS — K56699 Other intestinal obstruction unspecified as to partial versus complete obstruction: Secondary | ICD-10-CM

## 2012-01-23 NOTE — Progress Notes (Signed)
Samantha Abbott is about 6 weeks out from her surgery. A calculated she will be ready for colostomy closure around 1 April. Her ostomy is doing okay and she's getting along well. I plan to see her around the end of February and we'll schedule her for surgery at that time.  Plan takedown colostomy around April 1.

## 2012-02-18 ENCOUNTER — Telehealth (INDEPENDENT_AMBULATORY_CARE_PROVIDER_SITE_OTHER): Payer: Self-pay | Admitting: Surgery

## 2012-02-20 ENCOUNTER — Encounter (INDEPENDENT_AMBULATORY_CARE_PROVIDER_SITE_OTHER): Payer: Self-pay | Admitting: Surgery

## 2012-02-20 ENCOUNTER — Ambulatory Visit (INDEPENDENT_AMBULATORY_CARE_PROVIDER_SITE_OTHER): Payer: Medicare Other | Admitting: Surgery

## 2012-02-20 VITALS — BP 152/80 | HR 60 | Temp 97.0°F | Resp 18 | Wt 130.0 lb

## 2012-02-20 DIAGNOSIS — K56699 Other intestinal obstruction unspecified as to partial versus complete obstruction: Secondary | ICD-10-CM

## 2012-02-20 DIAGNOSIS — K56609 Unspecified intestinal obstruction, unspecified as to partial versus complete obstruction: Secondary | ICD-10-CM

## 2012-02-20 NOTE — Patient Instructions (Signed)
Take some enemas to clean out the lower segment Colonoscopy with Dr. Caroleen Hamman colostomy late March

## 2012-02-20 NOTE — Progress Notes (Signed)
Mr. And Mrs. Samantha Abbott came in today to discuss colostomy closure. She had a emergent sigmoid colectomy in December for an obstructing diverticular stricture.   BENIGN SEGMENT OF COLON WITH RUPTURE DIVERTICULITIS ASSOCIATED AND PERICOLONIC SOFT TISSUE ACUTE AND CHRONIC INFLAMMATION AND FIBROSIS WITH INVOLVEMENT WITH SEROSA AND ASSOCIATED SEROSAL ADHESIONS. Prior to taking down her colostomy I think it would be good for her to have a colonoscopy by her gastroenterologist, Dr. Vida Rigger.  We will set that up with him and I will plan on taking down her colostomy near the end of March.  Patient Active Problem List  Diagnoses  . Stricture of sigmoid colon-diverticulitis-post resection Hartmann procedure 12/12  . GERD (gastroesophageal reflux disease)  . Irregular heartbeat

## 2012-02-26 ENCOUNTER — Other Ambulatory Visit (INDEPENDENT_AMBULATORY_CARE_PROVIDER_SITE_OTHER): Payer: Self-pay | Admitting: General Surgery

## 2012-02-26 DIAGNOSIS — K5732 Diverticulitis of large intestine without perforation or abscess without bleeding: Secondary | ICD-10-CM

## 2012-03-09 ENCOUNTER — Encounter (HOSPITAL_COMMUNITY): Payer: Self-pay | Admitting: Pharmacy Technician

## 2012-03-12 ENCOUNTER — Encounter (HOSPITAL_COMMUNITY): Payer: Self-pay

## 2012-03-12 ENCOUNTER — Encounter (HOSPITAL_COMMUNITY)
Admission: RE | Admit: 2012-03-12 | Discharge: 2012-03-12 | Disposition: A | Discharge status: Home / Self Care | Payer: Medicare Other | Source: Ambulatory Visit | Attending: Surgery | Admitting: Surgery

## 2012-03-12 LAB — SURGICAL PCR SCREEN
MRSA, PCR: NEGATIVE
Staphylococcus aureus: NEGATIVE

## 2012-03-12 LAB — CBC
Hemoglobin: 10.3 g/dL — ABNORMAL LOW (ref 12.0–15.0)
RBC: 3.69 MIL/uL — ABNORMAL LOW (ref 3.87–5.11)

## 2012-03-12 LAB — BASIC METABOLIC PANEL
CO2: 26 mEq/L (ref 19–32)
Chloride: 103 mEq/L (ref 96–112)
Glucose, Bld: 110 mg/dL — ABNORMAL HIGH (ref 70–99)
Potassium: 4.1 mEq/L (ref 3.5–5.1)
Sodium: 138 mEq/L (ref 135–145)

## 2012-03-12 MED ORDER — CHLORHEXIDINE GLUCONATE 4 % EX LIQD
1.0000 "application " | Freq: Once | CUTANEOUS | Status: DC
  Filled 2012-03-12: qty 15

## 2012-03-12 NOTE — Pre-Procedure Instructions (Signed)
03/12/12 Nurse called and check on status of patient this am.  Patient voiced no complaints. Patient states she has not rechecked B/P since back home but she feels sure B/P elevated due to needle sticks.  Reminded patient to keep check on blood pressure.

## 2012-03-12 NOTE — Pre-Procedure Instructions (Signed)
03/12/12 Wilder Glade made aware at Dr Staci Acosta of elevated B/P this am and that nurse had checked on pt this afternoon. I told them I would let Dr Shana Chute be aware on 03/16/12.

## 2012-03-12 NOTE — Patient Instructions (Signed)
22 Samantha Abbott  03/12/2012   Your procedure is scheduled on:  03/24/12 0730am-1030am  Report to [facility] at 0530 AM.  Call this number if you have problems the morning of surgery: [phone]   Remember: Fleets enema nite before surgery    Do not eat food:After Midnight.  May have clear liquids:until Midnight .    Take these medicines the morning of surgery with A SIP OF WATER:    Do not wear jewelry, make-up or nail polish.  Do not wear lotions, powders, or perfumes.   Do not shave 48 hours prior to surgery.  Do not bring valuables to the hospital.  Contacts, dentures or bridgework may not be worn into surgery.  Leave suitcase in the car. After surgery it may be brought to your room.  For patients admitted to the hospital, checkout time is 11:00 AM the day of discharge.    Special Instructions: CHG Shower Use Special Wash: 1/2 bottle night before surgery and 1/2 bottle morning of surgery. shower chin to toes with CHG.  Wash face and private parts with regular soap.    Please read over the following fact sheets that you were given: MRSA Information, coughing and deep breathing exercises

## 2012-03-16 NOTE — Pre-Procedure Instructions (Signed)
03/16/12 Dr Shana Chute in office today.  Faxed B/P results from 03/12/12 to office. Also requested and received LOV of 01/29/12 and placed on chart.

## 2012-03-16 NOTE — Pre-Procedure Instructions (Signed)
03/16/12 Also received OV note from Dr Shana Chute dated 10/30/11 on chart.  Placed both notes along with confirmation of fax under progress notes.

## 2012-03-19 ENCOUNTER — Other Ambulatory Visit: Payer: Self-pay | Admitting: Gastroenterology

## 2012-03-23 ENCOUNTER — Telehealth (INDEPENDENT_AMBULATORY_CARE_PROVIDER_SITE_OTHER): Payer: Self-pay | Admitting: General Surgery

## 2012-03-23 NOTE — Telephone Encounter (Signed)
Spoke with Shiny at [facility] [phone] called out Rx for Nuletly, pt aware of how to take this per our conversation. Also instructed to use a fleets enema rectally this evening.

## 2012-03-24 ENCOUNTER — Encounter (HOSPITAL_COMMUNITY): Payer: Self-pay | Admitting: *Deleted

## 2012-03-24 ENCOUNTER — Encounter (HOSPITAL_COMMUNITY): Payer: Self-pay | Admitting: Anesthesiology

## 2012-03-24 ENCOUNTER — Encounter (HOSPITAL_COMMUNITY)
Admission: RE | Disposition: A | Discharge status: Home / Self Care | Payer: Self-pay | Source: Ambulatory Visit | Attending: Surgery

## 2012-03-24 ENCOUNTER — Ambulatory Visit (HOSPITAL_COMMUNITY): Payer: Medicare Other | Admitting: Anesthesiology

## 2012-03-24 ENCOUNTER — Inpatient Hospital Stay (HOSPITAL_COMMUNITY)
Admission: RE | Admit: 2012-03-24 | Discharge: 2012-03-31 | DRG: 330 | Disposition: A | Discharge status: Home / Self Care | Payer: Medicare Other | Source: Ambulatory Visit | Attending: Surgery | Admitting: Surgery

## 2012-03-24 DIAGNOSIS — I252 Old myocardial infarction: Secondary | ICD-10-CM

## 2012-03-24 DIAGNOSIS — Z433 Encounter for attention to colostomy: Secondary | ICD-10-CM

## 2012-03-24 DIAGNOSIS — K56699 Other intestinal obstruction unspecified as to partial versus complete obstruction: Secondary | ICD-10-CM

## 2012-03-24 DIAGNOSIS — K660 Peritoneal adhesions (postprocedural) (postinfection): Secondary | ICD-10-CM

## 2012-03-24 DIAGNOSIS — K560 Paralytic ileus: Secondary | ICD-10-CM | POA: Diagnosis not present

## 2012-03-24 DIAGNOSIS — I10 Essential (primary) hypertension: Secondary | ICD-10-CM | POA: Diagnosis present

## 2012-03-24 DIAGNOSIS — K219 Gastro-esophageal reflux disease without esophagitis: Secondary | ICD-10-CM | POA: Diagnosis present

## 2012-03-24 DIAGNOSIS — F17200 Nicotine dependence, unspecified, uncomplicated: Secondary | ICD-10-CM | POA: Diagnosis present

## 2012-03-24 DIAGNOSIS — Z01812 Encounter for preprocedural laboratory examination: Secondary | ICD-10-CM

## 2012-03-24 HISTORY — PX: BOWEL RESECTION: SHX1257

## 2012-03-24 LAB — TYPE AND SCREEN: Antibody Screen: NEGATIVE

## 2012-03-24 LAB — CBC
HCT: 33.5 % — ABNORMAL LOW (ref 36.0–46.0)
MCH: 27.9 pg (ref 26.0–34.0)
MCHC: 31.3 g/dL (ref 30.0–36.0)
MCV: 88.9 fL (ref 78.0–100.0)
Platelets: 391 10*3/uL (ref 150–400)
RDW: 16.7 % — ABNORMAL HIGH (ref 11.5–15.5)
WBC: 11.8 10*3/uL — ABNORMAL HIGH (ref 4.0–10.5)

## 2012-03-24 LAB — CREATININE, SERUM: GFR calc Af Amer: 70 mL/min — ABNORMAL LOW (ref >90)

## 2012-03-24 SURGERY — CLOSURE, COLOSTOMY
Anesthesia: General | Site: Abdomen | Wound class: Dirty or Infected

## 2012-03-24 MED ORDER — ONDANSETRON HCL 4 MG/2ML IJ SOLN
4.0000 mg | Freq: Four times a day (QID) | INTRAMUSCULAR | Status: DC | PRN
  Administered 2012-03-27: 4 mg via INTRAVENOUS
  Filled 2012-03-24: qty 2

## 2012-03-24 MED ORDER — ROCURONIUM BROMIDE 100 MG/10ML IV SOLN
INTRAVENOUS | Status: DC | PRN
  Administered 2012-03-24: 10 mg via INTRAVENOUS
  Administered 2012-03-24: 45 mg via INTRAVENOUS
  Administered 2012-03-24: 20 mg via INTRAVENOUS

## 2012-03-24 MED ORDER — PROMETHAZINE HCL 25 MG/ML IJ SOLN: 6.2500 mg | INTRAMUSCULAR | Status: DC | PRN

## 2012-03-24 MED ORDER — LIDOCAINE HCL (CARDIAC) 20 MG/ML IV SOLN
INTRAVENOUS | Status: DC | PRN
  Administered 2012-03-24: 50 mg via INTRAVENOUS

## 2012-03-24 MED ORDER — HEPARIN SODIUM (PORCINE) 5000 UNIT/ML IJ SOLN
5000.0000 [IU] | Freq: Three times a day (TID) | INTRAMUSCULAR | Status: DC
  Administered 2012-03-24 – 2012-03-31 (×19): 5000 [IU] via SUBCUTANEOUS
  Filled 2012-03-24 (×23): qty 1

## 2012-03-24 MED ORDER — PANTOPRAZOLE SODIUM 40 MG IV SOLR
40.0000 mg | Freq: Every day | INTRAVENOUS | Status: DC
  Administered 2012-03-24 – 2012-03-30 (×7): 40 mg via INTRAVENOUS
  Filled 2012-03-24 (×8): qty 40

## 2012-03-24 MED ORDER — KCL IN DEXTROSE-NACL 20-5-0.45 MEQ/L-%-% IV SOLN
INTRAVENOUS | Status: DC
  Administered 2012-03-24: 100 mL/h via INTRAVENOUS
  Administered 2012-03-25 – 2012-03-26 (×3): via INTRAVENOUS
  Administered 2012-03-26 – 2012-03-27 (×2): 1000 mL via INTRAVENOUS
  Administered 2012-03-27 – 2012-03-30 (×5): via INTRAVENOUS
  Filled 2012-03-24 (×19): qty 1000

## 2012-03-24 MED ORDER — ACETAMINOPHEN 10 MG/ML IV SOLN
1000.0000 mg | Freq: Four times a day (QID) | INTRAVENOUS | Status: AC
  Administered 2012-03-24 – 2012-03-25 (×4): 1000 mg via INTRAVENOUS
  Filled 2012-03-24 (×3): qty 100

## 2012-03-24 MED ORDER — FLEET ENEMA 7-19 GM/118ML RE ENEM: 1.0000 | ENEMA | Freq: Once | RECTAL | Status: DC

## 2012-03-24 MED ORDER — ACETAMINOPHEN 10 MG/ML IV SOLN
INTRAVENOUS | Status: AC
  Administered 2012-03-24: 12:00:00
  Filled 2012-03-24: qty 100

## 2012-03-24 MED ORDER — DIPHENHYDRAMINE HCL 50 MG/ML IJ SOLN: 12.5000 mg | Freq: Four times a day (QID) | INTRAMUSCULAR | Status: DC | PRN

## 2012-03-24 MED ORDER — ERTAPENEM SODIUM 1 G IJ SOLR
1.0000 g | INTRAMUSCULAR | Status: AC
  Administered 2012-03-24: 1 g via INTRAVENOUS

## 2012-03-24 MED ORDER — PHENYLEPHRINE HCL 10 MG/ML IJ SOLN
INTRAMUSCULAR | Status: DC | PRN
  Administered 2012-03-24: 80 ug via INTRAVENOUS
  Administered 2012-03-24 (×3): 40 ug via INTRAVENOUS

## 2012-03-24 MED ORDER — SODIUM CHLORIDE 0.9 % IV SOLN
INTRAVENOUS | Status: AC
  Filled 2012-03-24: qty 1

## 2012-03-24 MED ORDER — DIPHENHYDRAMINE HCL 12.5 MG/5ML PO ELIX: 12.5000 mg | ORAL_SOLUTION | Freq: Four times a day (QID) | ORAL | Status: DC | PRN

## 2012-03-24 MED ORDER — HEPARIN SODIUM (PORCINE) 5000 UNIT/ML IJ SOLN
5000.0000 [IU] | Freq: Once | INTRAMUSCULAR | Status: AC
  Administered 2012-03-24: 5000 [IU] via SUBCUTANEOUS

## 2012-03-24 MED ORDER — NEOSTIGMINE METHYLSULFATE 1 MG/ML IJ SOLN
INTRAMUSCULAR | Status: DC | PRN
  Administered 2012-03-24: 4 mg via INTRAVENOUS

## 2012-03-24 MED ORDER — HYDROMORPHONE HCL PF 1 MG/ML IJ SOLN
INTRAMUSCULAR | Status: AC
  Filled 2012-03-24: qty 1

## 2012-03-24 MED ORDER — HEPARIN SODIUM (PORCINE) 5000 UNIT/ML IJ SOLN
INTRAMUSCULAR | Status: AC
  Administered 2012-03-25: 5000 [IU] via SUBCUTANEOUS
  Filled 2012-03-24: qty 1

## 2012-03-24 MED ORDER — GLYCOPYRROLATE 0.2 MG/ML IJ SOLN
INTRAMUSCULAR | Status: DC | PRN
  Administered 2012-03-24: .6 mg via INTRAVENOUS

## 2012-03-24 MED ORDER — LACTATED RINGERS IV SOLN
INTRAVENOUS | Status: DC | PRN
  Administered 2012-03-24 (×2): via INTRAVENOUS

## 2012-03-24 MED ORDER — ONDANSETRON HCL 4 MG/2ML IJ SOLN
INTRAMUSCULAR | Status: DC | PRN
  Administered 2012-03-24: 4 mg via INTRAVENOUS

## 2012-03-24 MED ORDER — 0.9 % SODIUM CHLORIDE (POUR BTL) OPTIME
TOPICAL | Status: DC | PRN
  Administered 2012-03-24: 1000 mL

## 2012-03-24 MED ORDER — MORPHINE SULFATE 2 MG/ML IJ SOLN
0.5000 mg | INTRAMUSCULAR | Status: DC | PRN
  Administered 2012-03-24 – 2012-03-30 (×17): 0.5 mg via INTRAVENOUS
  Filled 2012-03-24 (×17): qty 1

## 2012-03-24 MED ORDER — BUPIVACAINE LIPOSOME 1.3 % IJ SUSP
INTRAMUSCULAR | Status: DC | PRN
  Administered 2012-03-24: 20 mL

## 2012-03-24 MED ORDER — FENTANYL CITRATE 0.05 MG/ML IJ SOLN
INTRAMUSCULAR | Status: DC | PRN
  Administered 2012-03-24: 25 ug via INTRAVENOUS
  Administered 2012-03-24: 50 ug via INTRAVENOUS
  Administered 2012-03-24: 75 ug via INTRAVENOUS
  Administered 2012-03-24 (×2): 100 ug via INTRAVENOUS
  Administered 2012-03-24: 50 ug via INTRAVENOUS

## 2012-03-24 MED ORDER — HYDRALAZINE HCL 20 MG/ML IJ SOLN
INTRAMUSCULAR | Status: DC | PRN
  Administered 2012-03-24: 5 mg via INTRAVENOUS

## 2012-03-24 MED ORDER — HYDROMORPHONE HCL PF 1 MG/ML IJ SOLN
0.2500 mg | INTRAMUSCULAR | Status: DC | PRN
  Administered 2012-03-24 (×2): 0.25 mg via INTRAVENOUS

## 2012-03-24 MED ORDER — SUCCINYLCHOLINE CHLORIDE 20 MG/ML IJ SOLN
INTRAMUSCULAR | Status: DC | PRN
  Administered 2012-03-24: 100 mg via INTRAVENOUS

## 2012-03-24 MED ORDER — PROPOFOL 10 MG/ML IV EMUL
INTRAVENOUS | Status: DC | PRN
  Administered 2012-03-24: 140 mg via INTRAVENOUS

## 2012-03-24 MED ORDER — BUPIVACAINE LIPOSOME 1.3 % IJ SUSP
20.0000 mL | Freq: Once | INTRAMUSCULAR | Status: DC
  Filled 2012-03-24: qty 20

## 2012-03-24 SURGICAL SUPPLY — 58 items
APPLICATOR COTTON TIP 6IN STRL (MISCELLANEOUS) ×2 IMPLANT
BLADE EXTENDED COATED 6.5IN (ELECTRODE) IMPLANT
BLADE HEX COATED 2.75 (ELECTRODE) ×4 IMPLANT
BLADE SURG SZ10 CARB STEEL (BLADE) ×2 IMPLANT
CANISTER SUCTION 2500CC (MISCELLANEOUS) ×2 IMPLANT
CLIP TI LARGE 6 (CLIP) IMPLANT
CLOTH BEACON ORANGE TIMEOUT ST (SAFETY) ×2 IMPLANT
COVER MAYO STAND STRL (DRAPES) ×2 IMPLANT
DRAPE LAPAROSCOPIC ABDOMINAL (DRAPES) ×2 IMPLANT
DRAPE LG THREE QUARTER DISP (DRAPES) IMPLANT
DRAPE WARM FLUID 44X44 (DRAPE) ×2 IMPLANT
DRSG PAD ABDOMINAL 8X10 ST (GAUZE/BANDAGES/DRESSINGS) ×2 IMPLANT
ELECT REM PT RETURN 9FT ADLT (ELECTROSURGICAL) ×2
ELECTRODE REM PT RTRN 9FT ADLT (ELECTROSURGICAL) ×1 IMPLANT
GLOVE BIOGEL M 8.0 STRL (GLOVE) ×4 IMPLANT
GLOVE BIOGEL PI IND STRL 7.0 (GLOVE) ×1 IMPLANT
GLOVE BIOGEL PI INDICATOR 7.0 (GLOVE) ×1
GOWN STRL NON-REIN LRG LVL3 (GOWN DISPOSABLE) ×2 IMPLANT
GOWN STRL REIN XL XLG (GOWN DISPOSABLE) ×4 IMPLANT
HAND ACTIVATED (MISCELLANEOUS) IMPLANT
KIT BASIN OR (CUSTOM PROCEDURE TRAY) ×2 IMPLANT
LEGGING LITHOTOMY PAIR STRL (DRAPES) IMPLANT
LIGASURE IMPACT 36 18CM CVD LR (INSTRUMENTS) ×2 IMPLANT
NEEDLE HYPO 22GX1.5 SAFETY (NEEDLE) ×2 IMPLANT
NS IRRIG 1000ML POUR BTL (IV SOLUTION) ×4 IMPLANT
PACK GENERAL/GYN (CUSTOM PROCEDURE TRAY) ×2 IMPLANT
PAD TELFA 2X3 NADH STRL (GAUZE/BANDAGES/DRESSINGS) ×2 IMPLANT
RELOAD PROXIMATE 75MM BLUE (ENDOMECHANICALS) ×6 IMPLANT
SCALPEL HARMONIC ACE (MISCELLANEOUS) IMPLANT
SEALER TISSUE G2 CVD JAW 35 (ENDOMECHANICALS) ×1 IMPLANT
SEALER TISSUE G2 CVD JAW 45CM (ENDOMECHANICALS) ×1
SPONGE GAUZE 4X4 12PLY (GAUZE/BANDAGES/DRESSINGS) IMPLANT
STAPLER PROXIMATE 75MM BLUE (STAPLE) ×2 IMPLANT
STAPLER VISISTAT 35W (STAPLE) ×2 IMPLANT
SUCTION POOLE TIP (SUCTIONS) ×2 IMPLANT
SUT NOV 1 T60/GS (SUTURE) IMPLANT
SUT NOVA 1 T20/GS 25DT (SUTURE) ×4 IMPLANT
SUT NOVA NAB DX-16 0-1 5-0 T12 (SUTURE) IMPLANT
SUT NOVA T20/GS 25 (SUTURE) IMPLANT
SUT PDS AB 1 CTX 36 (SUTURE) IMPLANT
SUT PDS AB 1 TP1 96 (SUTURE) ×4 IMPLANT
SUT PDS AB 3-0 SH 27 (SUTURE) ×4 IMPLANT
SUT PDS AB 4-0 SH 27 (SUTURE) ×4 IMPLANT
SUT SILK 2 0 (SUTURE) ×1
SUT SILK 2 0 SH CR/8 (SUTURE) ×2 IMPLANT
SUT SILK 2 0SH CR/8 30 (SUTURE) IMPLANT
SUT SILK 2-0 18XBRD TIE 12 (SUTURE) ×1 IMPLANT
SUT SILK 2-0 30XBRD TIE 12 (SUTURE) IMPLANT
SUT SILK 3 0 (SUTURE) ×1
SUT SILK 3 0 SH CR/8 (SUTURE) ×8 IMPLANT
SUT SILK 3-0 18XBRD TIE 12 (SUTURE) ×1 IMPLANT
SUT VIC AB 3-0 54XBRD REEL (SUTURE) IMPLANT
SUT VIC AB 3-0 BRD 54 (SUTURE)
SYR 30ML LL (SYRINGE) ×2 IMPLANT
TAPE CLOTH SURG 4X10 WHT LF (GAUZE/BANDAGES/DRESSINGS) ×2 IMPLANT
TOWEL OR 17X26 10 PK STRL BLUE (TOWEL DISPOSABLE) ×4 IMPLANT
TRAY FOLEY CATH 14FRSI W/METER (CATHETERS) ×2 IMPLANT
YANKAUER SUCT BULB TIP NO VENT (SUCTIONS) ×2 IMPLANT

## 2012-03-24 NOTE — H&P (Signed)
Chief Complaint:  Prior sigmoid obstruction from diverticular stricture  History of Present Illness:  Samantha Abbott is an 78 y.o. female on whom I did a sigmoid resection 12/06/11 for a sigmoid obstruction.  Since then she has had a colonoscopy to exclude cancer and presents today to have her ostomy taken down.    Past Medical History  Diagnosis Date  . Abnormal heart rhythm   . Nausea   . Vomiting   . Arthritis   . Abdominal pain   . Esophageal ulcer   . Flatulence, eructation, and gas pain   . Myocardial infarction     silent MI 1961/ states anterior wall  . Dysrhythmia   . Iron deficiency anemia   . GERD (gastroesophageal reflux disease)     with esophageal ulcer in past  . Hypertension     LOV WITH EKG DR [facility] RECEIVED 12/02/11 1530 and placed on chart    Past Surgical History  Procedure Date  . Abdominal hysterectomy   . Cholecystectomy   . Foot surgery   . Colonoscopy   . Colon surgery 12/06/11    lap sigmoid colectomy     Current Facility-Administered Medications  Medication Dose Route Frequency Provider Last Rate Last Dose  . ertapenem (INVANZ) 1 g in sodium chloride 0.9 % 50 mL IVPB  1 g Intravenous 60 min Pre-Op Valarie Merino, MD      . heparin 5000 UNIT/ML injection           . heparin injection 5,000 Units  5,000 Units Subcutaneous Once Valarie Merino, MD   5,000 Units at 03/24/12 0615  . sodium phosphate (FLEET) 7-19 GM/118ML enema 1 enema  1 enema Rectal Once Valarie Merino, MD       Review of patient's allergies indicates no known allergies. Family History  Problem Relation Age of Onset  . Cancer Mother     breast cancer  . Heart disease Father     heart attack    Social History:   reports that she has been smoking Cigarettes.  She has a 25 pack-year smoking history. She has never used smokeless tobacco. She reports that she does not drink alcohol or use illicit drugs.   REVIEW OF SYSTEMS - PERTINENT POSITIVES ONLY: Not  contributory  Physical Exam:   Blood pressure 189/69, pulse 65, temperature 97 F (36.1 C), resp. rate 20, SpO2 97.00%. There is no height or weight on file to calculate BMI.  Gen:  WDWN AA NAD  Neurological: Alert and oriented to person, place, and time. Motor and sensory function is grossly intact  Head: Normocephalic and atraumatic.  Eyes: Conjunctivae are normal. Pupils are equal, round, and reactive to light. No scleral icterus.  Neck: Normal range of motion. Neck supple. No tracheal deviation or thyromegaly present.  Cardiovascular:  SR without murmurs or gallops.  No carotid bruits Respiratory: Effort normal.  No respiratory distress. No chest wall tenderness. Breath sounds normal.  No wheezes, rales or rhonchi.  Abdomen:  Well functioning LLQ ostomy GU: Musculoskeletal: Normal range of motion. Extremities are nontender. No cyanosis, edema or clubbing noted Lymphadenopathy: No cervical, preauricular, postauricular or axillary adenopathy is present Skin: Skin is warm and dry. No rash noted. No diaphoresis. No erythema. No pallor. Pscyh: Normal mood and affect. Behavior is normal. Judgment and thought content normal.   LABORATORY RESULTS: Results for orders placed during the hospital encounter of 03/24/12 (from the past 48 hour(s))  TYPE AND SCREEN  Status: Normal   Collection Time   03/24/12  6:35 AM      Component Value Range Comment   ABO/RH(D) A POS      Antibody Screen NEG      Sample Expiration 03/27/2012       RADIOLOGY RESULTS: No results found.  Problem List: Patient Active Problem List  Diagnoses  . Stricture of sigmoid colon-diverticulitis-post resection Hartmann procedure 12/12  . GERD (gastroesophageal reflux disease)  . Irregular heartbeat    Assessment & Plan: Sigmoid colostomy who is admitted for ostomy takedown.  She is aware of the risks of this surgery.  I performed this exam in the holding area and she is ready to proceed.     Matt B. Daphine Deutscher,  MD, [facility] [phone] beeper [phone]  03/24/2012 7:40 AM

## 2012-03-24 NOTE — Op Note (Signed)
Surgeon: Wenda Low, MD, FACS  Asst:  Jaclynn Guarneri, M.D. FACS  Anes:  General  Procedure: Laparotomy with extensive enteral lysis and small bowel resection primary anastomosis, takedown colostomy with sigmoid to proximal rectum handsewn 2 layer anastomosis  Diagnosis: Previous diverticular stricture requiring Hartman procedure  Complications: Enterotomies with some spillage. Patient had bowel prep  EBL:   30 cc cc  Description of Procedure:  Patient was taken to room 1 and given general anesthesia. The ostomy was sewn closed with a pursestring. Abdomen was prepped with PCMX and draped sterilely. Timeout was performed area and old scar was excised and the midline was identified. The abdomen was entered slowly and small bowel was noted to be markedly stuck to the anterior abdominal wall. I took this down sharply dissection and spent an hour and a half lysing adhesions. There was one area that was weakened and began to leak and clamped it and then found another area it was twisted on the ostomy the had some leakage as well I had to resect both areas. Subsequently after the completion enterolysis to resect about 10 inches of bowel to contain these 2 enterotomies in the did a primary anastomosis using a side-to-side technique GIA closing the common channel with 2 layers of PDS 40 and 3-0 silk. Then irrigated. We then performed an anastomosis using a Roddie Mc technique with the side of the proximal sigmoid to the end of the upper rectum. This was done by first dividing the ostomy which had taken down with sharp dissection. Table off the actual ostomy. I then sewed the antimesenteric border of the colon down to the back wall of this distal sigmoid proximal rectum. I then opened both and did a 2 layer handsewn anastomosis using 3-0 PDS and then 3-0 silk second layer. Patent anastomosis was present. We changed our gloves and irrigated with several liters of saline. The prior ostomy site was closed with  interrupted #1 Novafils. A wick was left in that incision. Prior to closure injected all sites with 30 cc of diluted Exparel. I closed the midline leaving a wick in the lower portion incision again with staples. Patient tolerated procedure well taken recovery room in satisfactory condition.  Matt B. Daphine Deutscher, MD, [facility], [place] [phone]

## 2012-03-24 NOTE — Transfer of Care (Signed)
Immediate Anesthesia Transfer of Care Note  Patient: LEYNA VANDERKOLK  Procedure(s) Performed: Procedure(s) (LRB): COLOSTOMY TAKEDOWN (N/A) SMALL BOWEL RESECTION (N/A) LAPAROSCOPIC LYSIS OF ADHESIONS (N/A)  Patient Location: PACU  Anesthesia Type: General  Level of Consciousness: awake and alert   Airway & Oxygen Therapy: Patient Spontanous Breathing and Patient connected to face mask oxygen  Post-op Assessment: Report given to PACU RN and Post -op Vital signs reviewed and stable  Post vital signs: Reviewed and stable  Complications: No apparent anesthesia complications

## 2012-03-24 NOTE — Anesthesia Preprocedure Evaluation (Signed)
Anesthesia Evaluation  Patient identified by MRN, date of birth, ID band Patient awake    Reviewed: Allergy & Precautions, H&P , NPO status , Patient's Chart, lab work & pertinent test results  Airway Mallampati: II TM Distance: >3 FB Neck ROM: Full    Dental No notable dental hx.    Pulmonary Current Smoker,  breath sounds clear to auscultation  Pulmonary exam normal       Cardiovascular hypertension, + Past MI + dysrhythmias Rhythm:Regular Rate:Normal     Neuro/Psych negative neurological ROS  negative psych ROS   GI/Hepatic Neg liver ROS, GERD-  ,  Endo/Other  negative endocrine ROS  Renal/GU negative Renal ROS  negative genitourinary   Musculoskeletal negative musculoskeletal ROS (+)   Abdominal   Peds negative pediatric ROS (+)  Hematology negative hematology ROS (+)   Anesthesia Other Findings   Reproductive/Obstetrics negative OB ROS                           Anesthesia Physical Anesthesia Plan  ASA: III  Anesthesia Plan: General   Post-op Pain Management:    Induction: Intravenous  Airway Management Planned: Oral ETT  Additional Equipment:   Intra-op Plan:   Post-operative Plan: Extubation in OR  Informed Consent: I have reviewed the patients History and Physical, chart, labs and discussed the procedure including the risks, benefits and alternatives for the proposed anesthesia with the patient or authorized representative who has indicated his/her understanding and acceptance.   Dental advisory given  Plan Discussed with: CRNA  Anesthesia Plan Comments:         Anesthesia Quick Evaluation

## 2012-03-24 NOTE — Anesthesia Postprocedure Evaluation (Signed)
  Anesthesia Post-op Note  Patient: Samantha Abbott  Procedure(s) Performed: Procedure(s) (LRB): COLOSTOMY TAKEDOWN (N/A) SMALL BOWEL RESECTION (N/A) LAPAROSCOPIC LYSIS OF ADHESIONS (N/A)  Patient Location: PACU  Anesthesia Type: General  Level of Consciousness: awake and alert   Airway and Oxygen Therapy: Patient Spontanous Breathing  Post-op Pain: mild  Post-op Assessment: Post-op Vital signs reviewed, Patient's Cardiovascular Status Stable, Respiratory Function Stable, Patent Airway and No signs of Nausea or vomiting  Post-op Vital Signs: stable  Complications: No apparent anesthesia complications

## 2012-03-25 LAB — CBC
HCT: 35.8 % — ABNORMAL LOW (ref 36.0–46.0)
MCH: 27.8 pg (ref 26.0–34.0)
MCV: 88.8 fL (ref 78.0–100.0)
RBC: 4.03 MIL/uL (ref 3.87–5.11)
RDW: 16.7 % — ABNORMAL HIGH (ref 11.5–15.5)
WBC: 15.4 10*3/uL — ABNORMAL HIGH (ref 4.0–10.5)

## 2012-03-25 LAB — COMPREHENSIVE METABOLIC PANEL
BUN: 7 mg/dL (ref 6–23)
CO2: 27 mEq/L (ref 19–32)
Chloride: 104 mEq/L (ref 96–112)
Creatinine, Ser: 0.87 mg/dL (ref 0.50–1.10)
GFR calc non Af Amer: 59 mL/min — ABNORMAL LOW (ref >90)
Total Bilirubin: 0.3 mg/dL (ref 0.3–1.2)

## 2012-03-25 MED ORDER — HYDRALAZINE HCL 20 MG/ML IJ SOLN
10.0000 mg | INTRAMUSCULAR | Status: DC | PRN
  Administered 2012-03-25 – 2012-03-31 (×8): 10 mg via INTRAVENOUS
  Filled 2012-03-25 (×8): qty 1

## 2012-03-25 NOTE — Progress Notes (Signed)
Patient refused to ambulate today, states feels too sore to go today but will do so tommorrow.  Offered pain medications but patient refused.

## 2012-03-25 NOTE — Progress Notes (Signed)
Patient ID: Samantha Abbott, female   DOB: [birth year], 78 y.o.   MRN: [id number] [facility] Progress Note:   1 Day Post-Op  Subjective: Mental status is clear.  No complaints.   Objective: Vital signs in last 24 hours: Temp:  [97.2 F (36.2 C)-101.4 F (38.6 C)] 98.3 F (36.8 C) (04/03 0600) Pulse Rate:  [61-67] 63  (04/03 0800) Resp:  [11-24] 11  (04/03 0800) BP: (155-200)/(48-67) 172/54 mmHg (04/03 0800) SpO2:  [93 %-100 %] 97 % (04/03 0800) FiO2 (%):  [2 %] 2 % (04/02 1300) Weight:  [136 lb 0.4 oz (61.7 kg)] 136 lb 0.4 oz (61.7 kg) (04/02 1300)  Intake/Output from previous day: 04/02 0701 - 04/03 0700 In: 4300 [I.V.:3900; IV Piggyback:400] Out: 2550 [Urine:2450; Blood:100] Intake/Output this shift: Total I/O In: 100 [I.V.:100] Out: 260 [Urine:260]  Physical Exam: Work of breathing is  Normal.  Dressings intact  Lab Results:  Results for orders placed during the hospital encounter of 03/24/12 (from the past 48 hour(s))  TYPE AND SCREEN     Status: Normal   Collection Time   03/24/12  6:35 AM      Component Value Range Comment   ABO/RH(D) A POS      Antibody Screen NEG      Sample Expiration 03/27/2012     CBC     Status: Abnormal   Collection Time   03/24/12  1:45 PM      Component Value Range Comment   WBC 11.8 (*) 4.0 - 10.5 (K/uL)    RBC 3.77 (*) 3.87 - 5.11 (MIL/uL)    Hemoglobin 10.5 (*) 12.0 - 15.0 (g/dL)    HCT 78.2 (*) 95.6 - 46.0 (%)    MCV 88.9  78.0 - 100.0 (fL)    MCH 27.9  26.0 - 34.0 (pg)    MCHC 31.3  30.0 - 36.0 (g/dL)    RDW 21.3 (*) 08.6 - 15.5 (%)    Platelets 391  150 - 400 (K/uL)   CREATININE, SERUM     Status: Abnormal   Collection Time   03/24/12  1:45 PM      Component Value Range Comment   Creatinine, Ser 0.86  0.50 - 1.10 (mg/dL)    GFR calc non Af Amer 60 (*) >90 (mL/min)    GFR calc Af Amer 70 (*) >90 (mL/min)   CBC     Status: Abnormal   Collection Time   03/25/12  3:30 AM      Component Value Range Comment   WBC  15.4 (*) 4.0 - 10.5 (K/uL)    RBC 4.03  3.87 - 5.11 (MIL/uL)    Hemoglobin 11.2 (*) 12.0 - 15.0 (g/dL)    HCT 57.8 (*) 46.9 - 46.0 (%)    MCV 88.8  78.0 - 100.0 (fL)    MCH 27.8  26.0 - 34.0 (pg)    MCHC 31.3  30.0 - 36.0 (g/dL)    RDW 62.9 (*) 52.8 - 15.5 (%)    Platelets 359  150 - 400 (K/uL)   COMPREHENSIVE METABOLIC PANEL     Status: Abnormal   Collection Time   03/25/12  3:30 AM      Component Value Range Comment   Sodium 136  135 - 145 (mEq/L)    Potassium 4.2  3.5 - 5.1 (mEq/L)    Chloride 104  96 - 112 (mEq/L)    CO2 27  19 - 32 (mEq/L)    Glucose, Bld 153 (*)  70 - 99 (mg/dL)    BUN 7  6 - 23 (mg/dL)    Creatinine, Ser 1.61  0.50 - 1.10 (mg/dL)    Calcium 8.1 (*) 8.4 - 10.5 (mg/dL)    Total Protein 6.4  6.0 - 8.3 (g/dL)    Albumin 2.6 (*) 3.5 - 5.2 (g/dL)    AST 11  0 - 37 (U/L)    ALT 5  0 - 35 (U/L)    Alkaline Phosphatase 79  39 - 117 (U/L)    Total Bilirubin 0.3  0.3 - 1.2 (mg/dL)    GFR calc non Af Amer 59 (*) >90 (mL/min)    GFR calc Af Amer 69 (*) >90 (mL/min)     Radiology/Results: No results found.  Anti-infectives: Anti-infectives     Start     Dose/Rate Route Frequency Ordered Stop   03/24/12 0550   ertapenem (INVANZ) 1 g in sodium chloride 0.9 % 50 mL IVPB        1 g 100 mL/hr over 30 Minutes Intravenous 60 min pre-op 03/24/12 0550 03/24/12 0757          Assessment/Plan: Problem List: Patient Active Problem List  Diagnoses  . Stricture of sigmoid colon-diverticulitis-post resection Hartmann procedure 12/12  . GERD (gastroesophageal reflux disease)  . Irregular heartbeat    Temp noted last night.  Feeling good this am.  Will transfer to floor.   1 Day Post-Op    LOS: 1 day   Matt B. Daphine Deutscher, MD, [facility] [phone] beeper [phone]  03/25/2012 8:28 AM

## 2012-03-26 MED ORDER — BIOTENE DRY MOUTH MT LIQD
15.0000 mL | Freq: Two times a day (BID) | OROMUCOSAL | Status: DC
  Administered 2012-03-26 – 2012-03-31 (×6): 15 mL via OROMUCOSAL

## 2012-03-26 NOTE — Progress Notes (Signed)
Patient assisted to stand by bedside for about 2 minutes then assisted back to bed.

## 2012-03-26 NOTE — Progress Notes (Addendum)
Patient looks very well today.  She got up and stood @ bedside  for about twenty minutes, she is now sitting in the chair. Performed ADL's w/ assistance.  Patient complained about the catheter irritating her slightly, Primary RN is inquiring about an order for removal.  Patient's O2 Saturation post ambulation to chair was 100% on room air.  She stated that her pain was a 7 on a numerical scale of 0-10, after activity.  Patient doesn't want any pain medication she stated, "I don't like to take medication".  Patient bowel sounds are still absent, no flatus passed but she has been belching. Patient blood pressure is 171/70. this was reported to Primary RN. Follow up regarding elevated B/P for additional monitoring as per endorsement to primary RN.Tobey Grim, SN NCAT Janeece Riggers Milbert Coulter, RN.

## 2012-03-26 NOTE — Progress Notes (Signed)
Patient ID: Samantha Abbott, female   DOB: Aug 30, 1925, 78 y.o.   MRN: [id number] [facility] Progress Note:   2 Days Post-Op  Subjective: Mental status is clear Objective: Vital signs in last 24 hours: Temp:  [98.3 F (36.8 C)-98.7 F (37.1 C)] 98.7 F (37.1 C) (04/04 0602) Pulse Rate:  [63-82] 69  (04/04 0602) Resp:  [15-18] 15  (04/04 0602) BP: (153-198)/(49-75) 161/71 mmHg (04/04 0602) SpO2:  [96 %-99 %] 97 % (04/04 0602)  Intake/Output from previous day: 04/03 0701 - 04/04 0700 In: 2347.5 [I.V.:2337.5; IV Piggyback:10] Out: 1675 [Urine:1675] Intake/Output this shift:    Physical Exam: Work of breathing is  Normal. Sore.    Lab Results:  Results for orders placed during the hospital encounter of 03/24/12 (from the past 48 hour(s))  CBC     Status: Abnormal   Collection Time   03/24/12  1:45 PM      Component Value Range Comment   WBC 11.8 (*) 4.0 - 10.5 (K/uL)    RBC 3.77 (*) 3.87 - 5.11 (MIL/uL)    Hemoglobin 10.5 (*) 12.0 - 15.0 (g/dL)    HCT 40.9 (*) 81.1 - 46.0 (%)    MCV 88.9  78.0 - 100.0 (fL)    MCH 27.9  26.0 - 34.0 (pg)    MCHC 31.3  30.0 - 36.0 (g/dL)    RDW 91.4 (*) 78.2 - 15.5 (%)    Platelets 391  150 - 400 (K/uL)   CREATININE, SERUM     Status: Abnormal   Collection Time   03/24/12  1:45 PM      Component Value Range Comment   Creatinine, Ser 0.86  0.50 - 1.10 (mg/dL)    GFR calc non Af Amer 60 (*) >90 (mL/min)    GFR calc Af Amer 70 (*) >90 (mL/min)   CBC     Status: Abnormal   Collection Time   03/25/12  3:30 AM      Component Value Range Comment   WBC 15.4 (*) 4.0 - 10.5 (K/uL)    RBC 4.03  3.87 - 5.11 (MIL/uL)    Hemoglobin 11.2 (*) 12.0 - 15.0 (g/dL)    HCT 95.6 (*) 21.3 - 46.0 (%)    MCV 88.8  78.0 - 100.0 (fL)    MCH 27.8  26.0 - 34.0 (pg)    MCHC 31.3  30.0 - 36.0 (g/dL)    RDW 08.6 (*) 57.8 - 15.5 (%)    Platelets 359  150 - 400 (K/uL)   COMPREHENSIVE METABOLIC PANEL     Status: Abnormal   Collection Time   03/25/12  3:30  AM      Component Value Range Comment   Sodium 136  135 - 145 (mEq/L)    Potassium 4.2  3.5 - 5.1 (mEq/L)    Chloride 104  96 - 112 (mEq/L)    CO2 27  19 - 32 (mEq/L)    Glucose, Bld 153 (*) 70 - 99 (mg/dL)    BUN 7  6 - 23 (mg/dL)    Creatinine, Ser 4.69  0.50 - 1.10 (mg/dL)    Calcium 8.1 (*) 8.4 - 10.5 (mg/dL)    Total Protein 6.4  6.0 - 8.3 (g/dL)    Albumin 2.6 (*) 3.5 - 5.2 (g/dL)    AST 11  0 - 37 (U/L)    ALT 5  0 - 35 (U/L)    Alkaline Phosphatase 79  39 - 117 (U/L)  Total Bilirubin 0.3  0.3 - 1.2 (mg/dL)    GFR calc non Af Amer 59 (*) >90 (mL/min)    GFR calc Af Amer 69 (*) >90 (mL/min)     Radiology/Results: No results found.  Anti-infectives: Anti-infectives     Start     Dose/Rate Route Frequency Ordered Stop   03/24/12 0550   ertapenem (INVANZ) 1 g in sodium chloride 0.9 % 50 mL IVPB        1 g 100 mL/hr over 30 Minutes Intravenous 60 min pre-op 03/24/12 0550 03/24/12 0757          Assessment/Plan: Problem List: Patient Active Problem List  Diagnoses  . Stricture of sigmoid colon-diverticulitis-post resection Hartmann procedure 12/12  . GERD (gastroesophageal reflux disease)  . Irregular heartbeat    Transferred to 3 east.   2 Days Post-Op    LOS: 2 days   Matt B. Daphine Deutscher, MD, [facility] [phone] beeper [phone]  03/26/2012 9:28 AM

## 2012-03-27 LAB — DIFFERENTIAL
Eosinophils Absolute: 0.2 10*3/uL (ref 0.0–0.7)
Lymphocytes Relative: 10 % — ABNORMAL LOW (ref 12–46)
Lymphs Abs: 1.1 10*3/uL (ref 0.7–4.0)
Neutro Abs: 9.2 10*3/uL — ABNORMAL HIGH (ref 1.7–7.7)
Neutrophils Relative %: 84 % — ABNORMAL HIGH (ref 43–77)

## 2012-03-27 LAB — CBC
Platelets: 348 10*3/uL (ref 150–400)
RBC: 3.47 MIL/uL — ABNORMAL LOW (ref 3.87–5.11)
WBC: 11 10*3/uL — ABNORMAL HIGH (ref 4.0–10.5)

## 2012-03-27 NOTE — Progress Notes (Signed)
Patient ID: Samantha Abbott, female   DOB: 21-Apr-1925, 78 y.o.   MRN: [id number] [facility] Progress Note:   3 Days Post-Op  Subjective: Mental status is alert Objective: Vital signs in last 24 hours: Temp:  [98.1 F (36.7 C)-99.4 F (37.4 C)] 98.2 F (36.8 C) (04/05 0547) Pulse Rate:  [70-80] 71  (04/05 0547) Resp:  [16-20] 16  (04/05 0547) BP: (168-198)/(68-74) 168/72 mmHg (04/05 0634) SpO2:  [98 %-100 %] 98 % (04/05 0547)  Intake/Output from previous day: 04/04 0701 - 04/05 0700 In: 2361.3 [I.V.:2351.3; IV Piggyback:10] Out: 1675 [Urine:1675] Intake/Output this shift:    Physical Exam: Work of breathing is  Normal.  No flatus yet.  Wicks removed from incisons.  Wound draining some.  Not red  Lab Results:  No results found for this or any previous visit (from the past 48 hour(s)).  Radiology/Results: No results found.  Anti-infectives: Anti-infectives     Start     Dose/Rate Route Frequency Ordered Stop   03/24/12 0550   ertapenem (INVANZ) 1 g in sodium chloride 0.9 % 50 mL IVPB        1 g 100 mL/hr over 30 Minutes Intravenous 60 min pre-op 03/24/12 0550 03/24/12 0757          Assessment/Plan: Problem List: Patient Active Problem List  Diagnoses  . Stricture of sigmoid colon-diverticulitis-post resection Hartmann procedure 12/12  . GERD (gastroesophageal reflux disease)  . Irregular heartbeat    No flautus yet.  Continue NPO.  Observe wound to heal secondarily where wicks had bee placed.  Staples otherwise in place 3 Days Post-Op    LOS: 3 days   Matt B. Daphine Deutscher, MD, [facility] [phone] beeper [phone]  03/27/2012 7:49 AM

## 2012-03-28 NOTE — Progress Notes (Signed)
Patient ID: ROSHONDA Abbott, female   DOB: 1925/08/30, 78 y.o.   MRN: [id number] 4 Days Post-Op  Subjective: Feels better today.  Vomited once yesterday but has had flatus since. Denies nausea or pain this AM  Objective: Vital signs in last 24 hours: Temp:  [98 F (36.7 C)-99 F (37.2 C)] 98.9 F (37.2 C) (04/06 0526) Pulse Rate:  [65-78] 65  (04/06 0526) Resp:  [16-18] 16  (04/06 0526) BP: (166-207)/(62-75) 175/66 mmHg (04/06 0526) SpO2:  [97 %-99 %] 97 % (04/06 0526) Last BM Date: 03/23/12  Intake/Output from previous day: 04/05 0701 - 04/06 0700 In: 0  Out: 1850 [Urine:1850] Intake/Output this shift:    General appearance: alert and no distress GI: normal findings: bowel sounds normal and soft, non-tender Incision/Wound: Clean and dry  Lab Results:   Basename 03/27/12 0912  WBC 11.0*  HGB 9.7*  HCT 30.7*  PLT 348   BMET No results found for this basename: NA:2,K:2,CL:2,CO2:2,GLUCOSE:2,BUN:2,CREATININE:2,CALCIUM:2 in the last 72 hours   Studies/Results: No results found.  Anti-infectives: Anti-infectives     Start     Dose/Rate Route Frequency Ordered Stop   03/24/12 0550   ertapenem (INVANZ) 1 g in sodium chloride 0.9 % 50 mL IVPB        1 g 100 mL/hr over 30 Minutes Intravenous 60 min pre-op 03/24/12 0550 03/24/12 0757          Assessment/Plan: s/p Procedure(s): COLOSTOMY TAKEDOWN SMALL BOWEL RESECTION LAPAROSCOPIC LYSIS OF ADHESIONS Doing well today.  Will start CL diet   LOS: 4 days    Shyteria Lewis T 03/28/2012

## 2012-03-29 NOTE — Progress Notes (Signed)
Patient ID: Samantha Abbott, female   DOB: August 26, 1925, 78 y.o.   MRN: [id number] 5 Days Post-Op  Subjective: No C/O.  Tol CL without nausea.  No BM but has had flatus  Objective: Vital signs in last 24 hours: Temp:  [98.2 F (36.8 C)-98.6 F (37 C)] 98.2 F (36.8 C) (04/07 0525) Pulse Rate:  [62-66] 65  (04/07 0525) Resp:  [17-19] 18  (04/07 0525) BP: (171-204)/(64-78) 184/78 mmHg (04/07 0525) SpO2:  [96 %-98 %] 97 % (04/07 0525) Last BM Date: 03/24/12  Intake/Output from previous day: 04/06 0701 - 04/07 0700 In: 5665 [P.O.:820; I.V.:4845] Out: 1350 [Urine:1350] Intake/Output this shift:    General appearance: alert and no distress GI: normal findings: soft, non-tender Incision/Wound: Clean without signs of infection  Lab Results:   Basename 03/27/12 0912  WBC 11.0*  HGB 9.7*  HCT 30.7*  PLT 348   BMET No results found for this basename: NA:2,K:2,CL:2,CO2:2,GLUCOSE:2,BUN:2,CREATININE:2,CALCIUM:2 in the last 72 hours   Studies/Results: No results found.  Anti-infectives: Anti-infectives     Start     Dose/Rate Route Frequency Ordered Stop   03/24/12 0550   ertapenem (INVANZ) 1 g in sodium chloride 0.9 % 50 mL IVPB        1 g 100 mL/hr over 30 Minutes Intravenous 60 min pre-op 03/24/12 0550 03/24/12 0757          Assessment/Plan: s/p Procedure(s): COLOSTOMY TAKEDOWN SMALL BOWEL RESECTION LAPAROSCOPIC LYSIS OF ADHESIONS Doing well Will start full liquid diet and restart home meds   LOS: 5 days    Samantha Abbott T 03/29/2012

## 2012-03-30 NOTE — Progress Notes (Signed)
Patient ID: Samantha Abbott, female   DOB: 1925-10-01, 78 y.o.   MRN: [id number] [facility] Progress Note:   6 Days Post-Op  Subjective: Mental status is clear Objective: Vital signs in last 24 hours: Temp:  [97.7 F (36.5 C)-98.6 F (37 C)] 98.3 F (36.8 C) (04/08 0520) Pulse Rate:  [64-72] 64  (04/08 0520) Resp:  [18-20] 20  (04/08 0520) BP: (160-201)/(58-71) 182/65 mmHg (04/08 0520) SpO2:  [94 %-100 %] 99 % (04/08 0520)  Intake/Output from previous day: 04/07 0701 - 04/08 0700 In: 2549 [P.O.:330; I.V.:2219] Out: 1150 [Urine:1150] Intake/Output this shift:    Physical Exam: Work of breathing is  normal  Lab Results:  No results found for this or any previous visit (from the past 48 hour(s)).  Radiology/Results: No results found.  Anti-infectives: Anti-infectives     Start     Dose/Rate Route Frequency Ordered Stop   03/24/12 0550   ertapenem (INVANZ) 1 g in sodium chloride 0.9 % 50 mL IVPB        1 g 100 mL/hr over 30 Minutes Intravenous 60 min pre-op 03/24/12 0550 03/24/12 0757          Assessment/Plan: Problem List: Patient Active Problem List  Diagnoses  . Stricture of sigmoid colon-diverticulitis-post resection Hartmann procedure 12/12  . GERD (gastroesophageal reflux disease)  . Irregular heartbeat    Doing well.  Passing gas.  Will advance to regular diet and plan discharge tomorrow 6 Days Post-Op    LOS: 6 days   Matt B. Daphine Deutscher, MD, [facility] [phone] beeper [phone]  03/30/2012 9:53 AM 2clear

## 2012-03-31 MED ORDER — HYDROCHLOROTHIAZIDE 12.5 MG PO TABS: 25.0000 mg | ORAL_TABLET | Freq: Every day | ORAL | Status: DC

## 2012-03-31 MED ORDER — OXYCODONE-ACETAMINOPHEN 5-325 MG PO TABS: 1.0000 | ORAL_TABLET | ORAL | Status: AC | PRN

## 2012-03-31 NOTE — Progress Notes (Signed)
Pt is for discharge, home instructions given, wound care provided with family members in attendance, IV access removed, follow up appointments in place, home with family in stable condition, verbalizes when to call MD if needed.

## 2012-03-31 NOTE — Discharge Instructions (Signed)

## 2012-03-31 NOTE — Discharge Summary (Signed)
Physician Discharge Summary  Patient ID: Samantha Abbott MRN: [id number] DOB/AGE: 01-21-1925 78 y.o.  Admit date: 03/24/2012 Discharge date: 03/31/2012  Admission Diagnoses:  Prior Gertie Gowda procedure for benign diverticular stricture of the colon  Discharge Diagnoses:  same  Active Problems:  * No active hospital problems. *    Surgery:  Takedown of colostomy   Discharged Condition: improved   Hospital Course:   Takedown colostomy and await postop ileus resolution.  Intermittent hypertension noted and HCTZ prescribed.  To followup with Dr. Shana Chute.  Consults: none  Significant Diagnostic Studies: none    Discharge Exam: Blood pressure 166/68, pulse 65, temperature 98.3 F (36.8 C), temperature source Oral, resp. rate 16, height 5\' 1"  (1.549 m), weight 136 lb 0.4 oz (61.7 kg), SpO2 100.00%. incison healing ok with staples in place.   Disposition: 01-Home or Self Care  Discharge Orders    Future Orders Please Complete By Expires   Diet - low sodium heart healthy      Increase activity slowly      Discharge instructions      Comments:   Stop by the office [phone]) and have staples removed next Monday   Discharge wound care:      Comments:   Apply neosporin to staple lines.  Call office [phone] and set up time to see nurse on Monday to have staples removed     Medication List  As of 03/31/2012 10:47 AM   TAKE these medications         hydrochlorothiazide 12.5 MG tablet   Commonly known as: HYDRODIURIL   Take 2 tablets (25 mg total) by mouth daily.      oxyCODONE-acetaminophen 5-325 MG per tablet   Commonly known as: PERCOCET   Take 1 tablet by mouth every 4 (four) hours as needed for pain.           Follow-up Information    Follow up with [name], MD. (check blood pressure)    Contact information:   [address] [place] [address] [phone]          Signed: Valarie Merino 03/31/2012, 10:47 AM

## 2012-04-02 ENCOUNTER — Encounter (HOSPITAL_COMMUNITY): Payer: Self-pay | Admitting: Surgery

## 2012-04-02 ENCOUNTER — Telehealth (INDEPENDENT_AMBULATORY_CARE_PROVIDER_SITE_OTHER): Payer: Self-pay | Admitting: Surgery

## 2012-04-06 ENCOUNTER — Ambulatory Visit (INDEPENDENT_AMBULATORY_CARE_PROVIDER_SITE_OTHER): Payer: Medicare Other | Admitting: General Surgery

## 2012-04-06 ENCOUNTER — Encounter (INDEPENDENT_AMBULATORY_CARE_PROVIDER_SITE_OTHER): Payer: Self-pay | Admitting: General Surgery

## 2012-04-06 VITALS — BP 148/70 | HR 70 | Temp 97.9°F | Resp 18 | Ht 61.0 in | Wt 127.2 lb

## 2012-04-06 DIAGNOSIS — Z4802 Encounter for removal of sutures: Secondary | ICD-10-CM

## 2012-04-09 ENCOUNTER — Encounter (INDEPENDENT_AMBULATORY_CARE_PROVIDER_SITE_OTHER): Payer: Self-pay | Admitting: Surgery

## 2012-04-14 ENCOUNTER — Encounter (INDEPENDENT_AMBULATORY_CARE_PROVIDER_SITE_OTHER): Payer: Self-pay

## 2012-04-16 ENCOUNTER — Ambulatory Visit (INDEPENDENT_AMBULATORY_CARE_PROVIDER_SITE_OTHER): Payer: Medicare Other | Admitting: Surgery

## 2012-04-16 ENCOUNTER — Encounter (INDEPENDENT_AMBULATORY_CARE_PROVIDER_SITE_OTHER): Payer: Self-pay | Admitting: Surgery

## 2012-04-16 ENCOUNTER — Other Ambulatory Visit (INDEPENDENT_AMBULATORY_CARE_PROVIDER_SITE_OTHER): Payer: Self-pay | Admitting: Surgery

## 2012-04-16 VITALS — BP 130/78 | HR 74 | Temp 97.2°F | Resp 16 | Ht 61.0 in | Wt 118.4 lb

## 2012-04-16 DIAGNOSIS — Z933 Colostomy status: Secondary | ICD-10-CM

## 2012-04-16 DIAGNOSIS — R112 Nausea with vomiting, unspecified: Secondary | ICD-10-CM

## 2012-04-16 DIAGNOSIS — R634 Abnormal weight loss: Secondary | ICD-10-CM

## 2012-04-16 DIAGNOSIS — N390 Urinary tract infection, site not specified: Secondary | ICD-10-CM

## 2012-04-16 DIAGNOSIS — D72829 Elevated white blood cell count, unspecified: Secondary | ICD-10-CM

## 2012-04-16 NOTE — Progress Notes (Signed)
Samantha Abbott 78 y.o.  Body mass index is 22.37 kg/(m^2).  Patient Active Problem List  Diagnoses  . Stricture of sigmoid colon-diverticulitis-post resection Hartmann procedure 12/12  . GERD (gastroesophageal reflux disease)  . Irregular heartbeat    No Known Allergies  Past Surgical History  Procedure Date  . Abdominal hysterectomy   . Cholecystectomy   . Foot surgery   . Colonoscopy   . Colon surgery 12/06/11    lap sigmoid colectomy   . Bowel resection 03/24/2012    Procedure: SMALL BOWEL RESECTION;  Surgeon: Valarie Merino, MD;  Location: [facility];  Service: General;  Laterality: N/A;   [name], MD, MD No diagnosis found.  3 weeks out from her colostomy takedown [name] is weak and has very little energy. Her abdomen is nontender and her incisions were healing fine. She could have problems with her potassium since I sent her home on some hydrochlorothiazide. She also has some burning at the end of urination he could have a urinary tract infection. She does have no appetite at the present time. On exam she does have bowel sounds and is nontender to palpation in the abdomen. I will get CBC, CMET, and urinalysis.  followup after these studies. Matt B. Daphine Deutscher, MD, [facility] [phone] beeper [phone]  04/16/2012 11:54 AM

## 2012-04-17 ENCOUNTER — Telehealth (INDEPENDENT_AMBULATORY_CARE_PROVIDER_SITE_OTHER): Payer: Self-pay | Admitting: General Surgery

## 2012-04-17 ENCOUNTER — Ambulatory Visit (HOSPITAL_COMMUNITY)
Admission: RE | Admit: 2012-04-17 | Discharge: 2012-04-17 | Disposition: A | Discharge status: Home / Self Care | Payer: Medicare Other | Source: Ambulatory Visit | Attending: Surgery | Admitting: Surgery

## 2012-04-17 ENCOUNTER — Other Ambulatory Visit (INDEPENDENT_AMBULATORY_CARE_PROVIDER_SITE_OTHER): Payer: Self-pay | Admitting: Surgery

## 2012-04-17 DIAGNOSIS — Z09 Encounter for follow-up examination after completed treatment for conditions other than malignant neoplasm: Secondary | ICD-10-CM | POA: Insufficient documentation

## 2012-04-17 DIAGNOSIS — D72829 Elevated white blood cell count, unspecified: Secondary | ICD-10-CM

## 2012-04-17 DIAGNOSIS — Z980 Intestinal bypass and anastomosis status: Secondary | ICD-10-CM | POA: Insufficient documentation

## 2012-04-17 DIAGNOSIS — K4090 Unilateral inguinal hernia, without obstruction or gangrene, not specified as recurrent: Secondary | ICD-10-CM | POA: Insufficient documentation

## 2012-04-17 DIAGNOSIS — N9489 Other specified conditions associated with female genital organs and menstrual cycle: Secondary | ICD-10-CM | POA: Insufficient documentation

## 2012-04-17 DIAGNOSIS — Z933 Colostomy status: Secondary | ICD-10-CM

## 2012-04-17 DIAGNOSIS — Z9089 Acquired absence of other organs: Secondary | ICD-10-CM | POA: Insufficient documentation

## 2012-04-17 LAB — URINALYSIS
Ketones, ur: NEGATIVE mg/dL
Nitrite: NEGATIVE
Protein, ur: 100 mg/dL — AB
pH: 5 (ref 5.0–8.0)

## 2012-04-17 LAB — CBC WITH DIFFERENTIAL/PLATELET
Basophils Absolute: 0 10*3/uL (ref 0.0–0.1)
Lymphocytes Relative: 8 % — ABNORMAL LOW (ref 12–46)
Lymphs Abs: 1.7 10*3/uL (ref 0.7–4.0)
MCV: 89.9 fL (ref 78.0–100.0)
Neutro Abs: 19.7 10*3/uL — ABNORMAL HIGH (ref 1.7–7.7)
Neutrophils Relative %: 88 % — ABNORMAL HIGH (ref 43–77)
Platelets: 440 10*3/uL — ABNORMAL HIGH (ref 150–400)
RBC: 3.58 MIL/uL — ABNORMAL LOW (ref 3.87–5.11)
RDW: 16.6 % — ABNORMAL HIGH (ref 11.5–15.5)
WBC: 22.5 10*3/uL — ABNORMAL HIGH (ref 4.0–10.5)

## 2012-04-17 LAB — COMPREHENSIVE METABOLIC PANEL
ALT: 8 U/L (ref 0–35)
AST: 9 U/L (ref 0–37)
Albumin: 3.4 g/dL — ABNORMAL LOW (ref 3.5–5.2)
CO2: 23 mEq/L (ref 19–32)
Calcium: 9.2 mg/dL (ref 8.4–10.5)
Chloride: 107 mEq/L (ref 96–112)
Creat: 0.81 mg/dL (ref 0.50–1.10)
Potassium: 3.6 mEq/L (ref 3.5–5.3)
Sodium: 144 mEq/L (ref 135–145)
Total Protein: 7.1 g/dL (ref 6.0–8.3)

## 2012-04-17 MED ORDER — IOHEXOL 300 MG/ML  SOLN
100.0000 mL | Freq: Once | INTRAMUSCULAR | Status: AC | PRN
  Administered 2012-04-17: 100 mL via INTRAVENOUS

## 2012-04-17 MED ORDER — SULFAMETHOXAZOLE-TMP DS 800-160 MG PO TABS: 1.0000 | ORAL_TABLET | Freq: Two times a day (BID) | ORAL | Status: AC

## 2012-04-17 NOTE — Telephone Encounter (Signed)
Called patient per Dr. Ermalene Searing request. Advised her that antibiotic will be called into her pharmacy, and confirmed she has someone that can pick it up for her. Dr. Daphine Deutscher also wants to schedule her for a CT scan, which I advised her I will call her back with by end of clinic this afternoon. Patient stated that if she has to drink anything for the test she does not know if she can do it because of how she feels.

## 2012-04-17 NOTE — Telephone Encounter (Signed)
Called patient and advised her the test was scheduled. Advised she needed to go to the hospital this afternoon in order for the test to be done today. She confirmed that she spoke with the pharmacy who advised her the script was ready for pick up.

## 2012-04-17 NOTE — Telephone Encounter (Signed)
Called patient, LVM to advise prescription has been ordered in addition to CT Scan (per Dr. Daphine Deutscher). Advised patient to please call back to confirm appointment for test Samantha Abbott).

## 2012-04-23 ENCOUNTER — Encounter (INDEPENDENT_AMBULATORY_CARE_PROVIDER_SITE_OTHER): Payer: Self-pay | Admitting: Surgery

## 2012-04-23 ENCOUNTER — Ambulatory Visit (INDEPENDENT_AMBULATORY_CARE_PROVIDER_SITE_OTHER): Payer: Medicare Other | Admitting: Surgery

## 2012-04-23 VITALS — BP 118/68 | HR 45 | Temp 96.4°F | Resp 16 | Ht 61.0 in | Wt 128.2 lb

## 2012-04-23 DIAGNOSIS — K56699 Other intestinal obstruction unspecified as to partial versus complete obstruction: Secondary | ICD-10-CM

## 2012-04-23 DIAGNOSIS — K56609 Unspecified intestinal obstruction, unspecified as to partial versus complete obstruction: Secondary | ICD-10-CM

## 2012-04-23 NOTE — Progress Notes (Signed)
Samantha Abbott 78 y.o.  Body mass index is 24.22 kg/(m^2).  Patient Active Problem List  Diagnoses  . Stricture of sigmoid colon-diverticulitis-post resection Hartmann procedure 12/12  . GERD (gastroesophageal reflux disease)  . Irregular heartbeat    No Known Allergies  Past Surgical History  Procedure Date  . Abdominal hysterectomy   . Cholecystectomy   . Foot surgery   . Colonoscopy   . Colon surgery 12/06/11    lap sigmoid colectomy   . Bowel resection 03/24/2012    Procedure: SMALL BOWEL RESECTION;  Surgeon: Valarie Merino, MD;  Location: [facility];  Service: General;  Laterality: N/A;   [name], MD, MD No diagnosis found.  CT showed no abscess.  UA suggested UTI.  She has responded to antibiotics.  Advised to complete thereapy.   Matt B. Daphine Deutscher, MD, [facility] [phone] beeper [phone]  04/23/2012 9:08 AM

## 2012-06-04 ENCOUNTER — Ambulatory Visit (INDEPENDENT_AMBULATORY_CARE_PROVIDER_SITE_OTHER): Payer: Medicare Other | Admitting: Surgery

## 2012-06-04 ENCOUNTER — Encounter (INDEPENDENT_AMBULATORY_CARE_PROVIDER_SITE_OTHER): Payer: Self-pay | Admitting: Surgery

## 2012-06-04 VITALS — BP 162/80 | HR 70 | Temp 97.9°F | Resp 20 | Ht 61.0 in | Wt 137.0 lb

## 2012-06-04 DIAGNOSIS — IMO0002 Reserved for concepts with insufficient information to code with codable children: Secondary | ICD-10-CM

## 2012-06-04 DIAGNOSIS — Z8719 Personal history of other diseases of the digestive system: Secondary | ICD-10-CM

## 2012-06-04 NOTE — Progress Notes (Signed)
Samantha Abbott 78 y.o.  Body mass index is 25.89 kg/(m^2).  Patient Active Problem List  Diagnosis  . Stricture of sigmoid colon-diverticulitis-post resection Hartmann procedure 12/12  . GERD (gastroesophageal reflux disease)  . Irregular heartbeat    No Known Allergies  Past Surgical History  Procedure Date  . Abdominal hysterectomy   . Cholecystectomy   . Foot surgery   . Colonoscopy   . Colon surgery 12/06/11    lap sigmoid colectomy   . Bowel resection 03/24/2012    Procedure: SMALL BOWEL RESECTION;  Surgeon: Valarie Merino, MD;  Location: [facility];  Service: General;  Laterality: N/A;   [name], MD No diagnosis found.  Doing well after colostomy closure.  She got to [facility] to see her grandson graduate with his masters in Surveyor, minerals.  So proud.   Return prn Matt B. Daphine Deutscher, MD, [facility] [phone] beeper [phone]  06/04/2012 11:15 AM

## 2012-06-04 NOTE — Patient Instructions (Addendum)
Return if needed

## 2015-01-18 ENCOUNTER — Other Ambulatory Visit: Payer: Self-pay | Admitting: Cardiology

## 2015-01-18 ENCOUNTER — Other Ambulatory Visit: Payer: Self-pay

## 2015-01-18 DIAGNOSIS — Z1231 Encounter for screening mammogram for malignant neoplasm of breast: Secondary | ICD-10-CM

## 2015-01-27 ENCOUNTER — Ambulatory Visit
Admission: RE | Admit: 2015-01-27 | Discharge: 2015-01-27 | Disposition: A | Discharge status: Home / Self Care | Payer: Medicare Other | Source: Ambulatory Visit | Attending: Cardiology | Admitting: Cardiology

## 2015-01-27 DIAGNOSIS — Z1231 Encounter for screening mammogram for malignant neoplasm of breast: Secondary | ICD-10-CM

## 2015-01-30 ENCOUNTER — Other Ambulatory Visit: Payer: Self-pay | Admitting: Cardiology

## 2015-02-01 ENCOUNTER — Other Ambulatory Visit: Payer: Self-pay | Admitting: Cardiology

## 2015-02-01 DIAGNOSIS — R928 Other abnormal and inconclusive findings on diagnostic imaging of breast: Secondary | ICD-10-CM

## 2015-02-10 ENCOUNTER — Other Ambulatory Visit: Payer: Self-pay | Admitting: Cardiology

## 2015-02-10 DIAGNOSIS — R928 Other abnormal and inconclusive findings on diagnostic imaging of breast: Secondary | ICD-10-CM

## 2015-02-14 ENCOUNTER — Ambulatory Visit
Admission: RE | Admit: 2015-02-14 | Discharge: 2015-02-14 | Disposition: A | Discharge status: Home / Self Care | Payer: Medicare Other | Source: Ambulatory Visit | Attending: Cardiology | Admitting: Cardiology

## 2015-02-14 ENCOUNTER — Other Ambulatory Visit: Payer: Self-pay | Admitting: Cardiology

## 2015-02-14 DIAGNOSIS — R928 Other abnormal and inconclusive findings on diagnostic imaging of breast: Secondary | ICD-10-CM

## 2015-02-15 ENCOUNTER — Other Ambulatory Visit: Payer: Self-pay | Admitting: Cardiology

## 2015-02-15 DIAGNOSIS — R928 Other abnormal and inconclusive findings on diagnostic imaging of breast: Secondary | ICD-10-CM

## 2015-02-21 ENCOUNTER — Ambulatory Visit
Admission: RE | Admit: 2015-02-21 | Discharge: 2015-02-21 | Disposition: A | Discharge status: Home / Self Care | Payer: Medicare Other | Source: Ambulatory Visit | Attending: Cardiology | Admitting: Cardiology

## 2015-02-21 DIAGNOSIS — R928 Other abnormal and inconclusive findings on diagnostic imaging of breast: Secondary | ICD-10-CM

## 2015-02-22 ENCOUNTER — Inpatient Hospital Stay: Admission: RE | Admit: 2015-02-22 | Payer: Medicare Other | Source: Ambulatory Visit

## 2015-02-22 ENCOUNTER — Other Ambulatory Visit: Payer: Self-pay | Admitting: Cardiology

## 2015-02-22 DIAGNOSIS — R928 Other abnormal and inconclusive findings on diagnostic imaging of breast: Secondary | ICD-10-CM

## 2017-03-26 ENCOUNTER — Other Ambulatory Visit: Payer: Self-pay | Admitting: Internal Medicine

## 2017-03-26 DIAGNOSIS — R5381 Other malaise: Secondary | ICD-10-CM

## 2017-04-01 ENCOUNTER — Other Ambulatory Visit: Payer: Self-pay | Admitting: Internal Medicine

## 2017-04-01 DIAGNOSIS — N6320 Unspecified lump in the left breast, unspecified quadrant: Secondary | ICD-10-CM

## 2017-04-04 ENCOUNTER — Ambulatory Visit
Admission: RE | Admit: 2017-04-04 | Discharge: 2017-04-04 | Disposition: A | Discharge status: Home / Self Care | Payer: Medicare Other | Source: Ambulatory Visit | Attending: Internal Medicine | Admitting: Internal Medicine

## 2017-04-04 ENCOUNTER — Other Ambulatory Visit: Payer: Self-pay | Admitting: Internal Medicine

## 2017-04-04 DIAGNOSIS — N6320 Unspecified lump in the left breast, unspecified quadrant: Secondary | ICD-10-CM

## 2017-04-04 DIAGNOSIS — R921 Mammographic calcification found on diagnostic imaging of breast: Secondary | ICD-10-CM

## 2017-04-04 DIAGNOSIS — R928 Other abnormal and inconclusive findings on diagnostic imaging of breast: Secondary | ICD-10-CM

## 2017-04-08 ENCOUNTER — Ambulatory Visit
Admission: RE | Admit: 2017-04-08 | Discharge: 2017-04-08 | Disposition: A | Discharge status: Home / Self Care | Payer: Medicare Other | Source: Ambulatory Visit | Attending: Internal Medicine | Admitting: Internal Medicine

## 2017-04-08 ENCOUNTER — Other Ambulatory Visit: Payer: Self-pay | Admitting: Internal Medicine

## 2017-04-08 DIAGNOSIS — N6320 Unspecified lump in the left breast, unspecified quadrant: Secondary | ICD-10-CM

## 2017-04-08 DIAGNOSIS — R928 Other abnormal and inconclusive findings on diagnostic imaging of breast: Secondary | ICD-10-CM

## 2017-04-08 DIAGNOSIS — R921 Mammographic calcification found on diagnostic imaging of breast: Secondary | ICD-10-CM

## 2017-04-15 ENCOUNTER — Other Ambulatory Visit: Payer: Self-pay | Admitting: Surgery

## 2017-04-17 ENCOUNTER — Encounter: Payer: Self-pay | Admitting: Radiation Oncology

## 2017-04-17 ENCOUNTER — Encounter: Payer: Self-pay | Admitting: Oncology

## 2017-04-18 NOTE — Progress Notes (Signed)
Location of Breast Cancer: left Breast  Histology per Pathology Report:  04/08/2017 FINAL DIAGNOSIS Diagnosis 1. Breast, left, needle core biopsy, 2:00 o'clock - INVASIVE MAMMARY CARCINOMA. - SEE COMMENT 2. Lymph node, needle/core biopsy, left axilla - INVASIVE MAMMARY CARCINOMA. - SEE COMMENT 3. 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  Receptor Status: ER( +), PR (+), Her2-neu (-), Ki-(60%)  Did patient present with symptoms (if so, please note symptoms) or was this found on screening mammography?:  04/04/2017 Note per mammogram;  83 year old patient presents with a worsening palpable lump and pain in the left breast. She has also noted skin thickening of the left breast and has developed multiple superficial nodules in the upper outer left breast and some skin dimpling, inferior to the left axilla. She has also palpated a lump more medially in the left axilla.  She describes that the pain also includes her left shoulder and left back. Her right breast is asymptomatic.  In February 2016, bilateral breast biopsies were recommended, and when the patient returned to undergo the biopsies, she decided not to proceed with him. No biopsy was performed. She has not followed up since that time. She is concerned about potential pain with biopsy.  The patient is a retired Equities trader. Her daughter presents with her today and is in the ultrasound room with her.  Past/Anticipated interventions by surgeon, if any: none  Past/Anticipated interventions by medical oncology, if any: Chemotherapy   Lymphedema issues, if any:  NO  Pain issues, if GYB:[facility] arm  SAFETY ISSUES: yes, has cane prong  Prior radiation? no  Pacemaker/ICD? no  Possible current pregnancy? no  Is the patient on methotrexate? no  Current Complaints / other details:  3 children, lives with daughter BP (!) 172/71  (BP Location: Right Arm, Patient Position: Sitting, Cuff Size: Normal)   Pulse 76   Temp 98 F (36.7 C) (Oral)   Resp 20   Ht 5' 1"  (1.549 m)   Wt 149 lb (67.6 kg)   SpO2 100%   BMI 28.15 kg/m   Wt Readings from Last 3 Encounters:  04/21/17 149 lb (67.6 kg)  06/04/12 137 lb (62.1 kg)  04/23/12 128 lb 3.2 oz (58.2 kg)

## 2017-04-21 ENCOUNTER — Ambulatory Visit
Admission: RE | Admit: 2017-04-21 | Discharge: 2017-04-21 | Disposition: A | Discharge status: Home / Self Care | Payer: Medicare Other | Source: Ambulatory Visit | Attending: Radiation Oncology | Admitting: Radiation Oncology

## 2017-04-21 ENCOUNTER — Encounter: Payer: Self-pay | Admitting: Radiation Oncology

## 2017-04-21 VITALS — BP 172/71 | HR 76 | Temp 98.0°F | Resp 20 | Ht 61.0 in | Wt 149.0 lb

## 2017-04-21 DIAGNOSIS — C50211 Malignant neoplasm of upper-inner quadrant of right female breast: Secondary | ICD-10-CM | POA: Insufficient documentation

## 2017-04-21 DIAGNOSIS — F17210 Nicotine dependence, cigarettes, uncomplicated: Secondary | ICD-10-CM | POA: Insufficient documentation

## 2017-04-21 DIAGNOSIS — I10 Essential (primary) hypertension: Secondary | ICD-10-CM | POA: Insufficient documentation

## 2017-04-21 DIAGNOSIS — C50412 Malignant neoplasm of upper-outer quadrant of left female breast: Secondary | ICD-10-CM | POA: Diagnosis not present

## 2017-04-21 DIAGNOSIS — Z79899 Other long term (current) drug therapy: Secondary | ICD-10-CM | POA: Diagnosis not present

## 2017-04-21 DIAGNOSIS — M25512 Pain in left shoulder: Secondary | ICD-10-CM | POA: Diagnosis not present

## 2017-04-21 DIAGNOSIS — M79602 Pain in left arm: Secondary | ICD-10-CM | POA: Insufficient documentation

## 2017-04-21 DIAGNOSIS — I252 Old myocardial infarction: Secondary | ICD-10-CM | POA: Insufficient documentation

## 2017-04-21 DIAGNOSIS — K219 Gastro-esophageal reflux disease without esophagitis: Secondary | ICD-10-CM | POA: Insufficient documentation

## 2017-04-21 DIAGNOSIS — Z170 Estrogen receptor positive status [ER+]: Secondary | ICD-10-CM | POA: Diagnosis not present

## 2017-04-21 MED ORDER — OXYCODONE HCL 5 MG PO CAPS: 5.0000 mg | ORAL_CAPSULE | Freq: Four times a day (QID) | ORAL | 0 refills | Status: DC | PRN

## 2017-04-21 MED ORDER — ONDANSETRON HCL 8 MG PO TABS: 8.0000 mg | ORAL_TABLET | Freq: Three times a day (TID) | ORAL | 1 refills | Status: DC | PRN

## 2017-04-21 NOTE — Progress Notes (Signed)
Please see the Nurse Progress Note in the MD Initial Consult Encounter for this patient. 

## 2017-04-21 NOTE — Progress Notes (Signed)
Radiation Oncology         [phone] ________________________________  Name: Samantha Abbott MRN: [id number]  Date: 04/21/2017  DOB: [birth year]  GU:[id number],BJSEGB Jenetta Downer, MD  Coralie Keens, MD     REFERRING PHYSICIAN: Coralie Keens, MD   DIAGNOSIS: The primary encounter diagnosis was Malignant neoplasm of upper-outer quadrant of left female breast, unspecified estrogen receptor status ([place]). A diagnosis of Carcinoma of upper-outer quadrant of left breast in female, estrogen receptor positive ([place]) was also pertinent to this visit.   HISTORY OF PRESENT ILLNESS: Samantha Abbott is a 83 y.o. female seen at the request of Dr. Ninfa Linden for a new diagnosis of left breast cancer. The patient presented for a routine mammogram on 01/27/15. There were possible calcifications noted in the right breast and a possible mass noted in the left breast. It was recommended the patient undergo biopsy at that time, but the patient declined.   The patient recently self palpated a lump in the left breast. She reports this lump has become more noticeable with associated thickening of the breast and multiple superficial nodules in the upper outer left breast with skin dimpling inferior to the left axilla. She also palpated a lump more medial to the left axilla. A diagnositic mammogram on 04/04/17 revealed a 3.9 x 3.8 x 3.1 cm irregular mass at the 2 o'clock position of the left breast with 5 mm of skin thickening. There was also a 1.0 x 0.9 x 1.3 cm mass in the medial and lower left axilla. Of the right breast there was a group of heterogeneous calcifications spanning 2.5 x 2.4 x 2.9 cm in the upper inner quadrant. All of these findings were suggestive of a neglected locally advanced left breast cancer with skin involvement and possible left axillary nodal metastasis. Biopsy on 04/08/17 at the 2 o'clock breast mass showed invasive mammary carcinoma. Biopsy of the lymph node of the left axilla showed invasive  mammary carcinoma. 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 Receptor status was ER/PR+, Her2-, and Ki67 60%. The patient will be seen by Dr. Jana Hakim on 04/23/17, and comes today to discuss options of radiotherapy. She reports she has decided to forgo any surgery and will not accept chemotherapy.   PREVIOUS RADIATION THERAPY: No   PAST MEDICAL HISTORY:  Past Medical History:  Diagnosis Date  . Abdominal pain   . Abnormal heart rhythm   . Arthritis   . Dysrhythmia   . Esophageal ulcer   . Flatulence, eructation, and gas pain   . GERD (gastroesophageal reflux disease)    with esophageal ulcer in past  . Hypertension    LOV WITH EKG DR [facility] RECEIVED 12/02/11 1530 and placed on chart  . Iron deficiency anemia   . Myocardial infarction [facility])    silent MI 1961/ states anterior wall  . Nausea   . Sinus problem   . Vomiting   . Wears glasses        PAST SURGICAL HISTORY: Past Surgical History:  Procedure Laterality Date  . ABDOMINAL HYSTERECTOMY    . BOWEL RESECTION  03/24/2012   Procedure: SMALL BOWEL RESECTION;  Surgeon: Pedro Earls, MD;  Location: [facility] ORS;  Service: General;  Laterality: N/A;  . BREAST EXCISIONAL BIOPSY Left   . BREAST EXCISIONAL BIOPSY Right   . CHOLECYSTECTOMY    . COLON SURGERY  12/06/11   lap sigmoid colectomy   . COLONOSCOPY    .  FOOT SURGERY       FAMILY HISTORY:  Family History  Problem Relation Age of Onset  . Cancer Mother     breast cancer  . Breast cancer Mother 86  . Heart disease Father     heart attack      SOCIAL HISTORY:  reports that she has been smoking Cigarettes.  She has a 25.00 pack-year smoking history. She has never used smokeless tobacco. She reports that she does not drink alcohol or use drugs. 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 She lives independently but has two daughters who accompany her who  help her with transportation and appointments.   ALLERGIES: Patient has no known allergies.   MEDICATIONS:  Current Outpatient Prescriptions  Medication Sig Dispense Refill  . losartan (COZAAR) 100 MG tablet Take 100 mg by mouth daily.    Marland Kitchen oxycodone (OXY-IR) 5 MG capsule Take 1 capsule (5 mg total) by mouth every 6 (six) hours as needed. 60 capsule 0  . polyethylene glycol (MIRALAX / GLYCOLAX) packet Take 17 g by mouth every other day.    . rosuvastatin (CRESTOR) 10 MG tablet Take 10 mg by mouth daily.    Marland Kitchen docusate sodium (COLACE) 100 MG capsule Take 100 mg by mouth 2 (two) times daily. Taking 1 in am and 1 in pm.    . hydrochlorothiazide (HYDRODIURIL) 12.5 MG tablet Take 2 tablets (25 mg total) by mouth daily. 30 tablet 3  . ondansetron (ZOFRAN) 8 MG tablet Take 1 tablet (8 mg total) by mouth every 8 (eight) hours as needed for nausea or vomiting. 30 tablet 1   No current facility-administered medications for this encounter.      REVIEW OF SYSTEMS: On review of systems, the patient reports that she is doing well overall. She denies any chest pain, shortness of breath, cough, fevers, chills, night sweats, unintended weight changes. She denies any bowel or bladder disturbances, and denies abdominal pain, nausea or vomiting. She reports left arm pain that begins in the shoulder and radiates down her left axilla and posterior aspect of her upper arm. This has been going on for several weeks and improves without intervention. She denies any other new musculoskeletal or joint aches or pains. A complete review of systems is obtained and is otherwise negative.     PHYSICAL EXAM:  Wt Readings from Last 3 Encounters:  04/21/17 149 lb (67.6 kg)  06/04/12 137 lb (62.1 kg)  04/23/12 128 lb 3.2 oz (58.2 kg)   Temp Readings from Last 3 Encounters:  04/21/17 98 F (36.7 C) (Oral)  06/04/12 97.9 F (36.6 C) (Temporal)  04/23/12 (!) 96.4 F (35.8 C) (Temporal)   BP Readings from Last 3  Encounters:  04/21/17 (!) 172/71  06/04/12 (!) 162/80  04/23/12 118/68   Pulse Readings from Last 3 Encounters:  04/21/17 76  06/04/12 70  04/23/12 (!) 45   Pain Assessment Pain Score: 2  Pain Loc: Breast/10  In general this is a well appearing african american woman in no acute distress. She is alert and oriented x4 and appropriate throughout the examination. HEENT reveals that the patient is normocephalic, atraumatic. EOMs are intact. PERRLA. Skin is intact without any evidence of gross lesions. Cardiovascular exam reveals a regular rate and rhythm, no clicks rubs or murmurs are auscultated. Chest is clear to auscultation bilaterally. Left breast exam shows edema of the breast and skin thickening consistent with peau d'orange at the areola from  6-9 o'clock, deep to site consistent with known disease. There is puckering of left lower outer quadrant and palpable fixed adenopathy. Keloid incision noted along the upper abdomen consisent with prior cholecystectomy. Abdomen has active bowel sounds in all quadrants and is intact. The abdomen is soft, non tender, non distended. Lower extremities are negative for pretibial pitting edema, deep calf tenderness, cyanosis or clubbing.  ECOG = 1  0 - Asymptomatic (Fully active, able to carry on all predisease activities without restriction)  1 - Symptomatic but completely ambulatory (Restricted in physically strenuous activity but ambulatory and able to carry out work of a light or sedentary nature. For example, light housework, office work)  2 - Symptomatic, <50% in bed during the day (Ambulatory and capable of all self care but unable to carry out any work activities. Up and about more than 50% of waking hours)  3 - Symptomatic, >50% in bed, but not bedbound (Capable of only limited self-care, confined to bed or chair 50% or more of waking hours)  4 - Bedbound (Completely disabled. Cannot carry on any self-care. Totally confined to bed or chair)  5  - Death   Eustace Pen MM, Creech RH, Tormey DC, et al. [phone]). "Toxicity and response criteria of the [facility]". [employer] Oncol. 5 (6): 649-55    LABORATORY DATA:  Lab Results  Component Value Date   WBC 22.5 (H) 04/16/2012   HGB 9.7 (L) 04/16/2012   HCT 32.2 (L) 04/16/2012   MCV 89.9 04/16/2012   PLT 440 (H) 04/16/2012   Lab Results  Component Value Date   NA 144 04/16/2012   K 3.6 04/16/2012   CL 107 04/16/2012   CO2 23 04/16/2012   Lab Results  Component Value Date   ALT 8 04/16/2012   AST 9 04/16/2012   ALKPHOS 91 04/16/2012   BILITOT 0.3 04/16/2012      RADIOGRAPHY: US Breast Ltd Uni Left Inc Axilla  Result Date: 04/04/2017 CLINICAL DATA:  83 year old patient presents with a worsening palpable lump and pain in the left breast. She has also noted skin thickening of the left breast and has developed multiple superficial nodules in the upper outer left breast and some skin dimpling, inferior to the left axilla. She has also palpated a lump more medially in the left axilla. She describes that the pain also includes her left shoulder and left back. Her right breast is asymptomatic. In February 2016, bilateral breast biopsies were recommended, and when the patient returned to undergo the biopsies, she decided not to proceed with him. No biopsy was performed. She has not followed up since that time. She is concerned about potential pain with biopsy. The patient is a retired Equities trader. Her daughter presents with her today and is in the ultrasound room with her. EXAM: 2D DIGITAL DIAGNOSTIC BILATERAL MAMMOGRAM WITH CAD AND ADJUNCT TOMO ULTRASOUND BILATERAL BREAST COMPARISON:  January 27, 2015 and February 14, 2015 ACR Breast Density Category b: There are scattered areas of fibroglandular density. FINDINGS: There has been a significant interval change in the appearance of the left breast. The mass previously described and recommended for biopsy in the  upper-outer quadrant of the left breast has enlarged. The mass is spiculated. A portion of the mass is deep to the imaging field. The images are to the best of the patient's ability but do not contain the pectoralis muscle. The spiculated mass corresponds to the palpable lump, as denoted by the overlying skin marker.I do  not detect a definite discrete mass in the medial left breast. Previously on the mammogram of February 14, 2015 a small mass was questioned in the medial left breast. There is now increased trabeculation throughout the left breast and diffuse left breast skin thickening. In the upper inner quadrant of the right breast posteriorly, a group of heterogeneous calcifications has now span approximately 2.5 x 2.4 x 2.9 cm. These were previously recommended for biopsy. There are stable benign appearing calcifications in the retroareolar right breast. No mass or architectural distortion is identified on the right. On physical exam, there is diffuse peau de orange of the left breast. There are visible small skin nodules in the far upper outer quadrant of the left breast, approximately mid axillary line, where there is some focal skin dimpling. The skin nodules are firm to palpation. There is a firm palpable mass in the upper-outer quadrant of the right breast centered at 2 o'clock position approximately 12 cm from the nipple that measures approximately 5 cm on physical exam. There is palpable lymphadenopathy in the inferior and slightly medial left axilla. No mass is palpated in the upper inner quadrant of the right breast. The skin of the right breast appears normal. Targeted ultrasound is performed, showing a markedly irregular hypoechoic mass in the 2 o'clock position left breast 12 cm from nipple measuring approximately 3.9 x 3.8 x 3.1 cm. There is diffuse skin thickening of the left breast measuring up to 5 mm in thickness. In the region of the visible skin nodules there are some vague hypoechoic areas  within the thickened skin that demonstrates vascular flow, at 2:30 position near the mid axillary line. In the medial and lower left axilla is a hypoechoic mass with irregular margins measuring 1.0 x 0.9 x 1.3 cm, favored to be a lymph node with metastasis and possible extracapsular extension. Ultrasound of the lower inner quadrant of the left breast was attempted to follow the 6 x 3 x 3 mm mass described at 8 o'clock position 4 cm from the nipple on the exam of March 2016. I do not detect a mass in the 8 o'clock region or remainder of the lower inner quadrant of the left breast on today's ultrasound. Ultrasound of the right breast is performed to attempt to identify the patient's suspicious microcalcifications by ultrasound. These are not clearly visualized. No mass is seen in the upper inner quadrant of the right breast. The right axilla is negative for lymphadenopathy. Mammographic images were processed with CAD. IMPRESSION: Clinical and imaging findings highly suggestive of a neglected locally advanced left breast cancer with skin involvement and possible left axillary nodal metastasis. Findings highly suspicious for ductal carcinoma in situ in the upper inner quadrant of the right breast. RECOMMENDATION: Ultrasound-guided biopsies of the palpable left breast mass and suspicious left axillary lymph node are recommended. Biopsies were offered to the patient today. Although the patient's daughter encouraged her to stay for biopsies, the patient declined to have been performed today. Stereotactic biopsy of the suspicious microcalcifications in the upper inner quadrant of the right breast is suggested. It was explained to the patient today that the stereotactic biopsy technique has changed since the last time she was here and she will be able to sit upright for the procedure. The patient's biopsies have been scheduled for next week on Tuesday, April 08, 2017. She was counseled about the procedures for biopsy, and  the typical mild stinging sensation associated with lidocaine, and that most patients do very well,  with no pain to minimal pain during the biopsy, after lidocaine has been administered. I telephoned Dr. Vista Lawman with the findings and recommendations for biopsy today. He will see the patient today and encouraged her to return for biopsies. I have discussed the findings and recommendations with the patient. Results were also provided in writing at the conclusion of the visit. If applicable, a reminder letter will be sent to the patient regarding the next appointment. BI-RADS CATEGORY  5: Highly suggestive of malignancy. Electronically Signed   By: Curlene Dolphin M.D.   On: 04/04/2017 17:07   US Breast Ltd Uni Right Inc Axilla  Result Date: 04/04/2017 CLINICAL DATA:  83 year old patient presents with a worsening palpable lump and pain in the left breast. She has also noted skin thickening of the left breast and has developed multiple superficial nodules in the upper outer left breast and some skin dimpling, inferior to the left axilla. She has also palpated a lump more medially in the left axilla. She describes that the pain also includes her left shoulder and left back. Her right breast is asymptomatic. In February 2016, bilateral breast biopsies were recommended, and when the patient returned to undergo the biopsies, she decided not to proceed with him. No biopsy was performed. She has not followed up since that time. She is concerned about potential pain with biopsy. The patient is a retired Equities trader. Her daughter presents with her today and is in the ultrasound room with her. EXAM: 2D DIGITAL DIAGNOSTIC BILATERAL MAMMOGRAM WITH CAD AND ADJUNCT TOMO ULTRASOUND BILATERAL BREAST COMPARISON:  January 27, 2015 and February 14, 2015 ACR Breast Density Category b: There are scattered areas of fibroglandular density. FINDINGS: There has been a significant interval change in the appearance of the left breast.  The mass previously described and recommended for biopsy in the upper-outer quadrant of the left breast has enlarged. The mass is spiculated. A portion of the mass is deep to the imaging field. The images are to the best of the patient's ability but do not contain the pectoralis muscle. The spiculated mass corresponds to the palpable lump, as denoted by the overlying skin marker.I do not detect a definite discrete mass in the medial left breast. Previously on the mammogram of February 14, 2015 a small mass was questioned in the medial left breast. There is now increased trabeculation throughout the left breast and diffuse left breast skin thickening. In the upper inner quadrant of the right breast posteriorly, a group of heterogeneous calcifications has now span approximately 2.5 x 2.4 x 2.9 cm. These were previously recommended for biopsy. There are stable benign appearing calcifications in the retroareolar right breast. No mass or architectural distortion is identified on the right. On physical exam, there is diffuse peau de orange of the left breast. There are visible small skin nodules in the far upper outer quadrant of the left breast, approximately mid axillary line, where there is some focal skin dimpling. The skin nodules are firm to palpation. There is a firm palpable mass in the upper-outer quadrant of the right breast centered at 2 o'clock position approximately 12 cm from the nipple that measures approximately 5 cm on physical exam. There is palpable lymphadenopathy in the inferior and slightly medial left axilla. No mass is palpated in the upper inner quadrant of the right breast. The skin of the right breast appears normal. Targeted ultrasound is performed, showing a markedly irregular hypoechoic mass in the 2 o'clock position left breast 12 cm from  nipple measuring approximately 3.9 x 3.8 x 3.1 cm. There is diffuse skin thickening of the left breast measuring up to 5 mm in thickness. In the region of  the visible skin nodules there are some vague hypoechoic areas within the thickened skin that demonstrates vascular flow, at 2:30 position near the mid axillary line. In the medial and lower left axilla is a hypoechoic mass with irregular margins measuring 1.0 x 0.9 x 1.3 cm, favored to be a lymph node with metastasis and possible extracapsular extension. Ultrasound of the lower inner quadrant of the left breast was attempted to follow the 6 x 3 x 3 mm mass described at 8 o'clock position 4 cm from the nipple on the exam of March 2016. I do not detect a mass in the 8 o'clock region or remainder of the lower inner quadrant of the left breast on today's ultrasound. Ultrasound of the right breast is performed to attempt to identify the patient's suspicious microcalcifications by ultrasound. These are not clearly visualized. No mass is seen in the upper inner quadrant of the right breast. The right axilla is negative for lymphadenopathy. Mammographic images were processed with CAD. IMPRESSION: Clinical and imaging findings highly suggestive of a neglected locally advanced left breast cancer with skin involvement and possible left axillary nodal metastasis. Findings highly suspicious for ductal carcinoma in situ in the upper inner quadrant of the right breast. RECOMMENDATION: Ultrasound-guided biopsies of the palpable left breast mass and suspicious left axillary lymph node are recommended. Biopsies were offered to the patient today. Although the patient's daughter encouraged her to stay for biopsies, the patient declined to have been performed today. Stereotactic biopsy of the suspicious microcalcifications in the upper inner quadrant of the right breast is suggested. It was explained to the patient today that the stereotactic biopsy technique has changed since the last time she was here and she will be able to sit upright for the procedure. The patient's biopsies have been scheduled for next week on Tuesday, April 08, 2017. She was counseled about the procedures for biopsy, and the typical mild stinging sensation associated with lidocaine, and that most patients do very well, with no pain to minimal pain during the biopsy, after lidocaine has been administered. I telephoned Dr. Vista Lawman with the findings and recommendations for biopsy today. He will see the patient today and encouraged her to return for biopsies. I have discussed the findings and recommendations with the patient. Results were also provided in writing at the conclusion of the visit. If applicable, a reminder letter will be sent to the patient regarding the next appointment. BI-RADS CATEGORY  5: Highly suggestive of malignancy. Electronically Signed   By: Curlene Dolphin M.D.   On: 04/04/2017 17:07   Mm Diag Breast Tomo Bilateral  Result Date: 04/04/2017 CLINICAL DATA:  83 year old patient presents with a worsening palpable lump and pain in the left breast. She has also noted skin thickening of the left breast and has developed multiple superficial nodules in the upper outer left breast and some skin dimpling, inferior to the left axilla. She has also palpated a lump more medially in the left axilla. She describes that the pain also includes her left shoulder and left back. Her right breast is asymptomatic. In February 2016, bilateral breast biopsies were recommended, and when the patient returned to undergo the biopsies, she decided not to proceed with him. No biopsy was performed. She has not followed up since that time. She is concerned about potential pain  with biopsy. The patient is a retired Equities trader. Her daughter presents with her today and is in the ultrasound room with her. EXAM: 2D DIGITAL DIAGNOSTIC BILATERAL MAMMOGRAM WITH CAD AND ADJUNCT TOMO ULTRASOUND BILATERAL BREAST COMPARISON:  January 27, 2015 and February 14, 2015 ACR Breast Density Category b: There are scattered areas of fibroglandular density. FINDINGS: There has been a  significant interval change in the appearance of the left breast. The mass previously described and recommended for biopsy in the upper-outer quadrant of the left breast has enlarged. The mass is spiculated. A portion of the mass is deep to the imaging field. The images are to the best of the patient's ability but do not contain the pectoralis muscle. The spiculated mass corresponds to the palpable lump, as denoted by the overlying skin marker.I do not detect a definite discrete mass in the medial left breast. Previously on the mammogram of February 14, 2015 a small mass was questioned in the medial left breast. There is now increased trabeculation throughout the left breast and diffuse left breast skin thickening. In the upper inner quadrant of the right breast posteriorly, a group of heterogeneous calcifications has now span approximately 2.5 x 2.4 x 2.9 cm. These were previously recommended for biopsy. There are stable benign appearing calcifications in the retroareolar right breast. No mass or architectural distortion is identified on the right. On physical exam, there is diffuse peau de orange of the left breast. There are visible small skin nodules in the far upper outer quadrant of the left breast, approximately mid axillary line, where there is some focal skin dimpling. The skin nodules are firm to palpation. There is a firm palpable mass in the upper-outer quadrant of the right breast centered at 2 o'clock position approximately 12 cm from the nipple that measures approximately 5 cm on physical exam. There is palpable lymphadenopathy in the inferior and slightly medial left axilla. No mass is palpated in the upper inner quadrant of the right breast. The skin of the right breast appears normal. Targeted ultrasound is performed, showing a markedly irregular hypoechoic mass in the 2 o'clock position left breast 12 cm from nipple measuring approximately 3.9 x 3.8 x 3.1 cm. There is diffuse skin thickening of  the left breast measuring up to 5 mm in thickness. In the region of the visible skin nodules there are some vague hypoechoic areas within the thickened skin that demonstrates vascular flow, at 2:30 position near the mid axillary line. In the medial and lower left axilla is a hypoechoic mass with irregular margins measuring 1.0 x 0.9 x 1.3 cm, favored to be a lymph node with metastasis and possible extracapsular extension. Ultrasound of the lower inner quadrant of the left breast was attempted to follow the 6 x 3 x 3 mm mass described at 8 o'clock position 4 cm from the nipple on the exam of March 2016. I do not detect a mass in the 8 o'clock region or remainder of the lower inner quadrant of the left breast on today's ultrasound. Ultrasound of the right breast is performed to attempt to identify the patient's suspicious microcalcifications by ultrasound. These are not clearly visualized. No mass is seen in the upper inner quadrant of the right breast. The right axilla is negative for lymphadenopathy. Mammographic images were processed with CAD. IMPRESSION: Clinical and imaging findings highly suggestive of a neglected locally advanced left breast cancer with skin involvement and possible left axillary nodal metastasis. Findings highly suspicious for ductal carcinoma in  situ in the upper inner quadrant of the right breast. RECOMMENDATION: Ultrasound-guided biopsies of the palpable left breast mass and suspicious left axillary lymph node are recommended. Biopsies were offered to the patient today. Although the patient's daughter encouraged her to stay for biopsies, the patient declined to have been performed today. Stereotactic biopsy of the suspicious microcalcifications in the upper inner quadrant of the right breast is suggested. It was explained to the patient today that the stereotactic biopsy technique has changed since the last time she was here and she will be able to sit upright for the procedure. The  patient's biopsies have been scheduled for next week on Tuesday, April 08, 2017. She was counseled about the procedures for biopsy, and the typical mild stinging sensation associated with lidocaine, and that most patients do very well, with no pain to minimal pain during the biopsy, after lidocaine has been administered. I telephoned Dr. Vista Lawman with the findings and recommendations for biopsy today. He will see the patient today and encouraged her to return for biopsies. I have discussed the findings and recommendations with the patient. Results were also provided in writing at the conclusion of the visit. If applicable, a reminder letter will be sent to the patient regarding the next appointment. BI-RADS CATEGORY  5: Highly suggestive of malignancy. Electronically Signed   By: Curlene Dolphin M.D.   On: 04/04/2017 17:07   [place] Axillary Node Core Biopsy Left  Addendum Date: 04/09/2017   ADDENDUM REPORT: 04/09/2017 14:46 ADDENDUM: Pathology revealed GRADE II INVASIVE MAMMARY CARCINOMA of the Left breast, 2:00 o'clock. INVASIVE MAMMARY CARCINOMA of the Left axillary node. 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 This was found to be concordant by Dr. Ammie Ferrier. Pathology results were discussed with the patient's daughter, Anastyn Ayars by telephone, per patient request. The patient reported her mother did well after the biopsies with tenderness and minimal bleeding at the sites. Post biopsy instructions and care were reviewed and questions were answered. The patient's daughter was encouraged to call [facility] for any additional concerns. Surgical consultation has been arranged with Dr. Nedra Hai at [facility] on April 15, 2017. Pathology results reported by Terie Purser, RN on 04/09/2017. Electronically Signed   By: Ammie Ferrier M.D.   On: 04/09/2017 14:46   Result Date:  04/09/2017 CLINICAL DATA:  83 year old female presenting for ultrasound-guided biopsy of a palpable left breast mass and a left axillary lymph node. EXAM: ULTRASOUND GUIDED LEFT BREAST CORE NEEDLE BIOPSY COMPARISON:  Previous exam(s). FINDINGS: I met with the patient and we discussed the procedure of ultrasound-guided biopsy, including benefits and alternatives. We discussed the high likelihood of a successful procedure. We discussed the risks of the procedure, including infection, bleeding, tissue injury, clip migration, and inadequate sampling. Informed written consent was given. The usual time-out protocol was performed immediately prior to the procedure. Lesion quadrant: Upper-outer Using sterile technique and 1% Lidocaine as local anesthetic, under direct ultrasound visualization, a 14 gauge spring-loaded device was used to perform biopsy of a left breast mass at 2 o'clock using an inferior approach. At the conclusion of the procedure a ribbon shaped tissue marker clip was deployed into the biopsy cavity. Using sterile technique and 1% Lidocaine as local anesthetic, under direct ultrasound visualization, a 14 gauge spring-loaded device was used to perform biopsy of a left axillary lymph node using an inferior approach. At the conclusion of the procedure  a spiral shaped tissue marker clip was deployed into the biopsy cavity. Follow up 2 view mammogram was performed and dictated separately. IMPRESSION: 1. Ultrasound guided biopsy of left breast mass at 2 o'clock. No apparent complications. 2. Ultrasound-guided biopsy of a left axillary lymph node. No apparent complications. 3. A stereotactic right breast biopsy was also performed today, which will be dictated under a separate report. Electronically Signed: By: Ammie Ferrier M.D. On: 04/08/2017 15:30   Mm Clip Placement Left  Result Date: 04/08/2017 CLINICAL DATA:  Post biopsy mammogram of the bilateral breasts for clip placement. EXAM: DIAGNOSTIC  BILATERAL MAMMOGRAM POST ULTRASOUND BIOPSY OF THE LEFT BREAST AND STEREOTACTIC BIOPSY OF THE RIGHT BREAST COMPARISON:  Previous exam(s). FINDINGS: Mammographic images were obtained following stereotactic guided biopsy of calcifications in the upper inner right breast. The coil shaped biopsy marking clip is appropriately positioned at the site of biopsy in the upper inner right breast. Mammographic images were obtained following ultrasound-guided biopsy of a left breast mass and left axillary lymph node. The ribbon shaped biopsy marking clip within the biopsied mass at 2 o'clock is appropriately positioned. The spiral shaped biopsy marking clip within lymph node in the left axilla is not visualized on the mammographic images. IMPRESSION: 1. Appropriate positioning of the coil shaped biopsy marking clip at the site of biopsied calcifications in the upper inner right breast. 2. Appropriate positioning of the ribbon shaped biopsy marking clip in the left breast mass at 2 o'clock. 3. The spiral shaped biopsy marking clip which was placed in the left axillary lymph node is not visible on these images. Final Assessment: Post Procedure Mammograms for Marker Placement Electronically Signed   By: Ammie Ferrier M.D.   On: 04/08/2017 15:54   Mm Clip Placement Right  Result Date: 04/08/2017 CLINICAL DATA:  Post biopsy mammogram of the bilateral breasts for clip placement. EXAM: DIAGNOSTIC BILATERAL MAMMOGRAM POST ULTRASOUND BIOPSY OF THE LEFT BREAST AND STEREOTACTIC BIOPSY OF THE RIGHT BREAST COMPARISON:  Previous exam(s). FINDINGS: Mammographic images were obtained following stereotactic guided biopsy of calcifications in the upper inner right breast. The coil shaped biopsy marking clip is appropriately positioned at the site of biopsy in the upper inner right breast. Mammographic images were obtained following ultrasound-guided biopsy of a left breast mass and left axillary lymph node. The ribbon shaped biopsy marking  clip within the biopsied mass at 2 o'clock is appropriately positioned. The spiral shaped biopsy marking clip within lymph node in the left axilla is not visualized on the mammographic images. IMPRESSION: 1. Appropriate positioning of the coil shaped biopsy marking clip at the site of biopsied calcifications in the upper inner right breast. 2. Appropriate positioning of the ribbon shaped biopsy marking clip in the left breast mass at 2 o'clock. 3. The spiral shaped biopsy marking clip which was placed in the left axillary lymph node is not visible on these images. Final Assessment: Post Procedure Mammograms for Marker Placement Electronically Signed   By: Ammie Ferrier M.D.   On: 04/08/2017 15:54   [place] Lt Breast Bx W Loc Dev 1st Lesion Img Bx Spec US Guide  Addendum Date: 04/09/2017   ADDENDUM REPORT: 04/09/2017 14:46 ADDENDUM: Pathology revealed GRADE II INVASIVE MAMMARY CARCINOMA of the Left breast, 2:00 o'clock. INVASIVE MAMMARY CARCINOMA of the Left axillary node. 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 This was found to be concordant by Dr. Ammie Ferrier. Pathology results were discussed with the  patient's daughter, Ary Rudnick by telephone, per patient request. The patient reported her mother did well after the biopsies with tenderness and minimal bleeding at the sites. Post biopsy instructions and care were reviewed and questions were answered. The patient's daughter was encouraged to call [facility] for any additional concerns. Surgical consultation has been arranged with Dr. Nedra Hai at [facility] on April 15, 2017. Pathology results reported by Terie Purser, RN on 04/09/2017. Electronically Signed   By: Ammie Ferrier M.D.   On: 04/09/2017 14:46   Result Date: 04/09/2017 CLINICAL DATA:  83 year old female presenting for ultrasound-guided biopsy of a palpable left breast  mass and a left axillary lymph node. EXAM: ULTRASOUND GUIDED LEFT BREAST CORE NEEDLE BIOPSY COMPARISON:  Previous exam(s). FINDINGS: I met with the patient and we discussed the procedure of ultrasound-guided biopsy, including benefits and alternatives. We discussed the high likelihood of a successful procedure. We discussed the risks of the procedure, including infection, bleeding, tissue injury, clip migration, and inadequate sampling. Informed written consent was given. The usual time-out protocol was performed immediately prior to the procedure. Lesion quadrant: Upper-outer Using sterile technique and 1% Lidocaine as local anesthetic, under direct ultrasound visualization, a 14 gauge spring-loaded device was used to perform biopsy of a left breast mass at 2 o'clock using an inferior approach. At the conclusion of the procedure a ribbon shaped tissue marker clip was deployed into the biopsy cavity. Using sterile technique and 1% Lidocaine as local anesthetic, under direct ultrasound visualization, a 14 gauge spring-loaded device was used to perform biopsy of a left axillary lymph node using an inferior approach. At the conclusion of the procedure a spiral shaped tissue marker clip was deployed into the biopsy cavity. Follow up 2 view mammogram was performed and dictated separately. IMPRESSION: 1. Ultrasound guided biopsy of left breast mass at 2 o'clock. No apparent complications. 2. Ultrasound-guided biopsy of a left axillary lymph node. No apparent complications. 3. A stereotactic right breast biopsy was also performed today, which will be dictated under a separate report. Electronically Signed: By: Ammie Ferrier M.D. On: 04/08/2017 15:30   Mm Rt Breast Bx W Loc Dev 1st Lesion Image Bx Spec Stereo Guide  Result Date: 04/08/2017 CLINICAL DATA:  83 year old female presenting for stereotactic biopsy of right breast calcifications. EXAM: RIGHT BREAST STEREOTACTIC CORE NEEDLE BIOPSY COMPARISON:  Previous  exams. FINDINGS: The patient and I discussed the procedure of stereotactic-guided biopsy including benefits and alternatives. We discussed the high likelihood of a successful procedure. We discussed the risks of the procedure including infection, bleeding, tissue injury, clip migration, and inadequate sampling. Informed written consent was given. The usual time out protocol was performed immediately prior to the procedure. Using sterile technique and 1% Lidocaine as local anesthetic, under stereotactic guidance, a 9 gauge vacuum assisted device was used to perform core needle biopsy of calcifications in the upper inner quadrant of the right breast using a superior approach. Specimen radiograph was performed showing calcifications within multiple core samples. Specimens with calcifications are identified for pathology. Lesion quadrant: Upper-inner At the conclusion of the procedure, a coil shaped tissue marker clip was deployed into the biopsy cavity. Follow-up 2-view mammogram was performed and dictated separately. IMPRESSION: 1. Stereotactic-guided biopsy of calcifications in the upper inner right breast. No apparent complications. 2. A double ultrasound-guided biopsy was performed of the left breast today, and was dictated in a separate report. Electronically Signed   By: Ammie Ferrier M.D.  On: 04/08/2017 15:29       IMPRESSION/PLAN: 1. At least Stage IIIA, [id number] grade 3. ER/PR positive invasive mammary carcinoma of the left breast. Dr. Lisbeth Renshaw discusses the pathology findings and reviews the nature of invasive, locally advanced breast disease. Dr. Lisbeth Renshaw reviews the standard of care is to pursue surgery, though the patient is against this, he discusses alternatives to her care. He would recommend discussion of estrogen blockade with Dr. Jana Hakim after palliative radiotherapy to the breast in 10 fractions including the regional nodes involved. The extent to her nodal coverage will be determined by  simulation scan. We discussed the risks, benefits, short, and long term effects of radiotherapy, and the patient is interested in proceeding. Dr. Lisbeth Renshaw discusses the delivery and logistics of radiotherapy. She will see Dr. Jana Hakim this week, and we anticipate he will discuss his thoughts on pursuing staging imaging. We will contact her to plan on simulation after she returns from a trip later this month. 2. Left shoulder and arm pain. As she's symptomatic, Dr. Lisbeth Renshaw indicated that perhaps staging imaging would assist in finding the source of her symptoms. We will follow this expectantly along with Dr. Jana Hakim.    The above documentation reflects my direct findings during this shared patient visit. Please see the separate note by Dr. Lisbeth Renshaw on this date for the remainder of the patient's plan of care.    Carola Rhine, PAC  This document serves as a record of services personally performed by Kyung Rudd, MD and Shona Simpson, PA-C. It was created on their behalf by Bethann Humble, a trained medical scribe. The creation of this record is based on the scribe's personal observations and the provider's statements to them. This document has been checked and approved by the attending provider.

## 2017-04-22 ENCOUNTER — Other Ambulatory Visit: Payer: Self-pay | Admitting: Adult Health

## 2017-04-22 DIAGNOSIS — Z170 Estrogen receptor positive status [ER+]: Principal | ICD-10-CM

## 2017-04-22 DIAGNOSIS — C50412 Malignant neoplasm of upper-outer quadrant of left female breast: Secondary | ICD-10-CM

## 2017-04-22 NOTE — Addendum Note (Signed)
Encounter addended by: Doreen Beam, RN on: 04/22/2017  9:06 AM<BR>    Actions taken: Charge Capture section accepted

## 2017-04-23 ENCOUNTER — Other Ambulatory Visit (HOSPITAL_BASED_OUTPATIENT_CLINIC_OR_DEPARTMENT_OTHER): Payer: Medicare Other

## 2017-04-23 ENCOUNTER — Ambulatory Visit (HOSPITAL_BASED_OUTPATIENT_CLINIC_OR_DEPARTMENT_OTHER): Payer: Medicare Other | Admitting: Oncology

## 2017-04-23 VITALS — BP 184/61 | HR 66 | Temp 98.2°F | Resp 18 | Ht 61.0 in | Wt 149.4 lb

## 2017-04-23 DIAGNOSIS — C50412 Malignant neoplasm of upper-outer quadrant of left female breast: Secondary | ICD-10-CM | POA: Diagnosis present

## 2017-04-23 DIAGNOSIS — Z170 Estrogen receptor positive status [ER+]: Principal | ICD-10-CM

## 2017-04-23 DIAGNOSIS — D0511 Intraductal carcinoma in situ of right breast: Secondary | ICD-10-CM

## 2017-04-23 DIAGNOSIS — C773 Secondary and unspecified malignant neoplasm of axilla and upper limb lymph nodes: Secondary | ICD-10-CM | POA: Diagnosis not present

## 2017-04-23 LAB — CBC WITH DIFFERENTIAL/PLATELET
BASO%: 0.6 % (ref 0.0–2.0)
Basophils Absolute: 0.1 10*3/uL (ref 0.0–0.1)
EOS ABS: 0.4 10*3/uL (ref 0.0–0.5)
EOS%: 4.9 % (ref 0.0–7.0)
HCT: 36 % (ref 34.8–46.6)
HEMOGLOBIN: 11.5 g/dL — AB (ref 11.6–15.9)
LYMPH%: 17.7 % (ref 14.0–49.7)
MCH: 30.4 pg (ref 25.1–34.0)
MCHC: 32 g/dL (ref 31.5–36.0)
MCV: 94.9 fL (ref 79.5–101.0)
MONO#: 0.5 10*3/uL (ref 0.1–0.9)
MONO%: 5.3 % (ref 0.0–14.0)
NEUT%: 71.5 % (ref 38.4–76.8)
NEUTROS ABS: 6.6 10*3/uL — AB (ref 1.5–6.5)
PLATELETS: 291 10*3/uL (ref 145–400)
RBC: 3.79 10*6/uL (ref 3.70–5.45)
RDW: 14.5 % (ref 11.2–14.5)
WBC: 9.2 10*3/uL (ref 3.9–10.3)
lymph#: 1.6 10*3/uL (ref 0.9–3.3)

## 2017-04-23 LAB — COMPREHENSIVE METABOLIC PANEL
ALBUMIN: 3.2 g/dL — AB (ref 3.5–5.0)
ALK PHOS: 165 U/L — AB (ref 40–150)
ALT: 10 U/L (ref 0–55)
ANION GAP: 13 meq/L — AB (ref 3–11)
AST: 25 U/L (ref 5–34)
BUN: 26.3 mg/dL — ABNORMAL HIGH (ref 7.0–26.0)
CO2: 25 mEq/L (ref 22–29)
Calcium: 10.8 mg/dL — ABNORMAL HIGH (ref 8.4–10.4)
Chloride: 107 mEq/L (ref 98–109)
Creatinine: 2.4 mg/dL — ABNORMAL HIGH (ref 0.6–1.1)
EGFR: 20 mL/min/{1.73_m2} — AB (ref >90)
Glucose: 117 mg/dl (ref 70–140)
Potassium: 4 mEq/L (ref 3.5–5.1)
Sodium: 145 mEq/L (ref 136–145)
TOTAL PROTEIN: 7.7 g/dL (ref 6.4–8.3)
Total Bilirubin: 0.42 mg/dL (ref 0.20–1.20)

## 2017-04-23 MED ORDER — ANASTROZOLE 1 MG PO TABS: 1.0000 mg | ORAL_TABLET | Freq: Every day | ORAL | 4 refills | Status: DC

## 2017-04-23 NOTE — Progress Notes (Signed)
[place]  [phone] Fax:([phone]) [phone]     ID: Samantha Abbott DOB: 06/14/1925  MR#: [id number]  MMH#:[id number]  Patient Care Team: Benito Mccreedy, MD as PCP - General (Internal Medicine) Chauncey Cruel, MD as Consulting Physician (Oncology) Johnathan Hausen, MD as Consulting Physician (General Surgery) Chauncey Cruel, MD OTHER MD:  CHIEF COMPLAINT: Estrogen receptor positive breast cancer  CURRENT TREATMENT: anastrozole, fulvestrant, palbociclib   BREAST CANCER HISTORY: The patient had mammography 02/14/2015 showing a small mass in the medial left breast. She refused biopsy at that time. More recently the patient noted a worsening lump in the left breast, with skin thickening and superficial nodules, as well as a lump in her left axilla. When this became painful she brought it to medical attention and on 04/04/2017 underwent bilateral diagnostic mammography with tomography and left sided ultrasonography. The breast density was category B.  In the upper-outer quadrant of the left breast there was a spiculated mass corresponding to the patient's palpable lump. This is not exactly where the mass had previously been noted in 2016. Exam showed diffuse peau d'orange of the left breast with skin nodules in the far upper outer quadrant firm to palpation. There was palpable lymphadenopathy in the inferior and medial left axilla.  Ultrasound of the left breast confirmed an irregular hypoechoic mass at the 2:00 position 12 cm from the nipple measuring 3.9 cm, and in the region of visible skin nodules there were some hypoechoic areas at the 2:30 o'clock position near the midaxillary line. Ultrasound of the lower inner quadrant of the left breast found a 0.6 cm mass at the 8:00 position 4 cm from the nipple. In the lower left axilla there was a 1.0 cm mass suspicious for metastatic spread.  In the upper inner quadrant of the right breast there was a group of  calcifications spanning 2.9 cm, which had been previously recommended by biopsy, which the patient refused. There was no mass or architectural distortion on the right ultrasound of the right breast and right axilla were otherwise negative.  On 04/08/2017 the patient underwent biopsy of the main mass in the left breast at 2:00, and left axillary lymph node, and the area of interest in the upper inner quadrant of the right breast. The left breast mass and lymph node both showed invasive ductal carcinoma, E-cadherin positive, with the prognostic profile from the breast mass showing the tumor to be estrogen receptor 100% positive with strong staining intensity, progesterone receptor 10% positive with moderate staining intensity, with an MIB-1 of 60%, and no HER-2 amplification, the signals ratio being 1.18 and the number per cell 1.30. 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   The patient's subsequent history is as detailed below  INTERVAL HISTORY: The patient was evaluated in the breast clinic 04/23/2017 accompanied by her niece Samantha Abbott and her daughter-in-law Samantha Abbott: Aside from the mass itself, there were no specific symptoms leading to the diagnostic mammogram.The patient has had persistent nausea for the past week, but denies unusual headaches, visual changes,  vomiting, stiff neck, dizziness, or gait imbalance. There have been no falls. At home she uses a walker. There has been no cough, phlegm production, or pleurisy, no chest pain or pressure, and no change in bowel or bladder habits. The patient denies fever, rash, bleeding, unexplained fatigue or unexplained weight loss. A  detailed review of systems was otherwise entirely negative.   PAST MEDICAL HISTORY: Past Medical History:  Diagnosis Date  . Abdominal pain   . Abnormal  heart rhythm   . Arthritis   . Dysrhythmia   . Esophageal ulcer   . Flatulence, eructation, and gas pain   . GERD (gastroesophageal reflux disease)    with esophageal ulcer in past  . Hypertension    LOV WITH EKG DR [facility] RECEIVED 12/02/11 1530 and placed on chart  . Iron deficiency anemia   . Myocardial infarction [facility])    silent MI 1961/ states anterior wall  . Nausea   . Sinus problem   . Vomiting   . Wears glasses     PAST SURGICAL HISTORY: Past Surgical History:  Procedure Laterality Date  . ABDOMINAL HYSTERECTOMY    . BOWEL RESECTION  03/24/2012   Procedure: SMALL BOWEL RESECTION;  Surgeon: Pedro Earls, MD;  Location: [facility] ORS;  Service: General;  Laterality: N/A;  . BREAST EXCISIONAL BIOPSY Left   . BREAST EXCISIONAL BIOPSY Right   . CHOLECYSTECTOMY    . COLON SURGERY  12/06/11   lap sigmoid colectomy   . COLONOSCOPY    . FOOT SURGERY      FAMILY HISTORY Family History  Problem Relation Age of Onset  . Cancer Mother     breast cancer  . Breast cancer Mother 24  . Heart disease Father     heart attack   The patient's father died from heart disease at age 65. The patient's mother died at age 75 with breast cancer which had been diagnosed a couple of years before. The patient had no brothers or sisters and no family members that she knows of with breast or ovarian cancer  GYNECOLOGIC HISTORY:  No LMP recorded. Patient has had a hysterectomy. Menarche age 41 she is GX P0. She underwent hysterectomy in 1966. She tells me she still has her cervix in place.  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 She is widowed. All her children are adopted. With her lives her son Samantha Abbott was a security guard Samantha Abbott his wife Samantha Abbott who is disabled secondary to Crohn's disease and is the main caregiver in the home, as well as the patient's grandchildren who are 44, 80, and 72 years old. The 2 older ones work for [employer],  the younger 1 is helping a disabled person The ptient also has a daughter Samantha Abbott lives in [place]. patient is a member of the local [employer]     ADVANCED DIRECTIVES: Not in place. At the 04/23/2017 visit the patient was given the appropriate documents to complete and notarize at her discretion    HEALTH MAINTENANCE: Social History  Substance Use Topics  . Smoking status: Current Some Day Smoker    Packs/day: 0.50    Years: 50.00    Types: Cigarettes  . Smokeless tobacco: Never Used  . Alcohol use No     Colonoscopy:  PAP:  Bone density:   No Known Allergies  Current Outpatient Prescriptions  Medication Sig Dispense Refill  . losartan (COZAAR) 100 MG tablet Take 100 mg by mouth daily.    . ondansetron (ZOFRAN) 8 MG tablet Take 1 tablet (8 mg total) by mouth every 8 (eight) hours as needed for nausea or vomiting. 30 tablet 1  . oxycodone (OXY-IR) 5 MG capsule Take 1 capsule (5 mg total) by mouth every 6 (  six) hours as needed. 60 capsule 0  . polyethylene glycol (MIRALAX / GLYCOLAX) packet Take 17 g by mouth every other day.    . rosuvastatin (CRESTOR) 10 MG tablet Take 10 mg by mouth daily.    Marland Kitchen anastrozole (ARIMIDEX) 1 MG tablet Take 1 tablet (1 mg total) by mouth daily. 90 tablet 4  . docusate sodium (COLACE) 100 MG capsule Take 100 mg by mouth 2 (two) times daily. Taking 1 in am and 1 in pm.    . hydrochlorothiazide (HYDRODIURIL) 12.5 MG tablet Take 2 tablets (25 mg total) by mouth daily. 30 tablet 3   No current facility-administered medications for this visit.     OBJECTIVE:Older African-American woman examined in a wheelchair Vitals:   04/23/17 1613  BP: (!) 184/61  Pulse: 66  Resp: 18  Temp: 98.2 F (36.8 C)     Body mass index is 28.23 kg/m.    ECOG FS:1 - Symptomatic but completely ambulatory  Ocular: Sclerae unicteric, bilateral arcus senilis Ear-nose-throat: Oropharynx clear, dentition in poor repair Lymphatic: No cervical or supraclavicular  adenopathy Lungs no rales or rhonchi Heart regular rate and rhythm Abd soft, obese nontender, positive bowel sounds MSK no focal spinal tenderness, no joint edema Neuro: non-focal, well-oriented, appropriate affect Breasts: I do not palpate a mass in the right breast. The right axilla is benign. The left breast is imaged below. There is a palpable lymph node in the left axilla. Note that I do not see evidence of inflammatory breast cancer in today's exam and I do not see evidence of peau d'orange which had been noted on the mammography report.  Lateral right breast, 04/23/2017    LAB RESULTS:  CMP     Component Value Date/Time   NA 145 04/23/2017 1553   K 4.0 04/23/2017 1553   CL 107 04/16/2012 1205   CO2 25 04/23/2017 1553   GLUCOSE 117 04/23/2017 1553   BUN 26.3 (H) 04/23/2017 1553   CREATININE 2.4 (H) 04/23/2017 1553   CALCIUM 10.8 (H) 04/23/2017 1553   PROT 7.7 04/23/2017 1553   ALBUMIN 3.2 (L) 04/23/2017 1553   AST 25 04/23/2017 1553   ALT 10 04/23/2017 1553   ALKPHOS 165 (H) 04/23/2017 1553   BILITOT 0.42 04/23/2017 1553   GFRNONAA 59 (L) 03/25/2012 0330   GFRAA 69 (L) 03/25/2012 0330    No results found for: TOTALPROTELP, ALBUMINELP, A1GS, A2GS, BETS, BETA2SER, GAMS, MSPIKE, SPEI  No results found for: KPAFRELGTCHN, LAMBDASER, [facility]  Lab Results  Component Value Date   WBC 9.2 04/23/2017   NEUTROABS 6.6 (H) 04/23/2017   HGB 11.5 (L) 04/23/2017   HCT 36.0 04/23/2017   MCV 94.9 04/23/2017   PLT 291 04/23/2017      Chemistry      Component Value Date/Time   NA 145 04/23/2017 1553   K 4.0 04/23/2017 1553   CL 107 04/16/2012 1205   CO2 25 04/23/2017 1553   BUN 26.3 (H) 04/23/2017 1553   CREATININE 2.4 (H) 04/23/2017 1553      Component Value Date/Time   CALCIUM 10.8 (H) 04/23/2017 1553   ALKPHOS 165 (H) 04/23/2017 1553   AST 25 04/23/2017 1553   ALT 10 04/23/2017 1553   BILITOT 0.42 04/23/2017 1553       No results found for: LABCA2  No  components found for: [id number]  No results for input(s): INR in the last 168 hours.  Urinalysis    Component Value Date/Time   COLORURINE YELLOW 04/16/2012 1205  APPEARANCEUR TURBID (A) 04/16/2012 1205   LABSPEC 1.025 04/16/2012 1205   PHURINE 5.0 04/16/2012 1205   GLUCOSEU NEG 04/16/2012 1205   HGBUR LARGE (A) 04/16/2012 1205   BILIRUBINUR MOD (A) 04/16/2012 1205   KETONESUR NEG 04/16/2012 1205   PROTEINUR 100 (A) 04/16/2012 1205   UROBILINOGEN 1 04/16/2012 1205   NITRITE NEG 04/16/2012 1205   LEUKOCYTESUR MOD (A) 04/16/2012 1205     STUDIES: US Breast Ltd Uni Left Inc Axilla  Result Date: 04/04/2017 CLINICAL DATA:  83 year old patient presents with a worsening palpable lump and pain in the left breast. She has also noted skin thickening of the left breast and has developed multiple superficial nodules in the upper outer left breast and some skin dimpling, inferior to the left axilla. She has also palpated a lump more medially in the left axilla. She describes that the pain also includes her left shoulder and left back. Her right breast is asymptomatic. In February 2016, bilateral breast biopsies were recommended, and when the patient returned to undergo the biopsies, she decided not to proceed with him. No biopsy was performed. She has not followed up since that time. She is concerned about potential pain with biopsy. The patient is a retired Equities trader. Her daughter presents with her today and is in the ultrasound room with her. EXAM: 2D DIGITAL DIAGNOSTIC BILATERAL MAMMOGRAM WITH CAD AND ADJUNCT TOMO ULTRASOUND BILATERAL BREAST COMPARISON:  January 27, 2015 and February 14, 2015 ACR Breast Density Category b: There are scattered areas of fibroglandular density. FINDINGS: There has been a significant interval change in the appearance of the left breast. The mass previously described and recommended for biopsy in the upper-outer quadrant of the left breast has enlarged. The mass is  spiculated. A portion of the mass is deep to the imaging field. The images are to the best of the patient's ability but do not contain the pectoralis muscle. The spiculated mass corresponds to the palpable lump, as denoted by the overlying skin marker.I do not detect a definite discrete mass in the medial left breast. Previously on the mammogram of February 14, 2015 a small mass was questioned in the medial left breast. There is now increased trabeculation throughout the left breast and diffuse left breast skin thickening. In the upper inner quadrant of the right breast posteriorly, a group of heterogeneous calcifications has now span approximately 2.5 x 2.4 x 2.9 cm. These were previously recommended for biopsy. There are stable benign appearing calcifications in the retroareolar right breast. No mass or architectural distortion is identified on the right. On physical exam, there is diffuse peau de orange of the left breast. There are visible small skin nodules in the far upper outer quadrant of the left breast, approximately mid axillary line, where there is some focal skin dimpling. The skin nodules are firm to palpation. There is a firm palpable mass in the upper-outer quadrant of the right breast centered at 2 o'clock position approximately 12 cm from the nipple that measures approximately 5 cm on physical exam. There is palpable lymphadenopathy in the inferior and slightly medial left axilla. No mass is palpated in the upper inner quadrant of the right breast. The skin of the right breast appears normal. Targeted ultrasound is performed, showing a markedly irregular hypoechoic mass in the 2 o'clock position left breast 12 cm from nipple measuring approximately 3.9 x 3.8 x 3.1 cm. There is diffuse skin thickening of the left breast measuring up to 5 mm in thickness. In  the region of the visible skin nodules there are some vague hypoechoic areas within the thickened skin that demonstrates vascular flow, at 2:30  position near the mid axillary line. In the medial and lower left axilla is a hypoechoic mass with irregular margins measuring 1.0 x 0.9 x 1.3 cm, favored to be a lymph node with metastasis and possible extracapsular extension. Ultrasound of the lower inner quadrant of the left breast was attempted to follow the 6 x 3 x 3 mm mass described at 8 o'clock position 4 cm from the nipple on the exam of March 2016. I do not detect a mass in the 8 o'clock region or remainder of the lower inner quadrant of the left breast on today's ultrasound. Ultrasound of the right breast is performed to attempt to identify the patient's suspicious microcalcifications by ultrasound. These are not clearly visualized. No mass is seen in the upper inner quadrant of the right breast. The right axilla is negative for lymphadenopathy. Mammographic images were processed with CAD. IMPRESSION: Clinical and imaging findings highly suggestive of a neglected locally advanced left breast cancer with skin involvement and possible left axillary nodal metastasis. Findings highly suspicious for ductal carcinoma in situ in the upper inner quadrant of the right breast. RECOMMENDATION: Ultrasound-guided biopsies of the palpable left breast mass and suspicious left axillary lymph node are recommended. Biopsies were offered to the patient today. Although the patient's daughter encouraged her to stay for biopsies, the patient declined to have been performed today. Stereotactic biopsy of the suspicious microcalcifications in the upper inner quadrant of the right breast is suggested. It was explained to the patient today that the stereotactic biopsy technique has changed since the last time she was here and she will be able to sit upright for the procedure. The patient's biopsies have been scheduled for next week on Tuesday, April 08, 2017. She was counseled about the procedures for biopsy, and the typical mild stinging sensation associated with lidocaine, and  that most patients do very well, with no pain to minimal pain during the biopsy, after lidocaine has been administered. I telephoned Dr. Vista Lawman with the findings and recommendations for biopsy today. He will see the patient today and encouraged her to return for biopsies. I have discussed the findings and recommendations with the patient. Results were also provided in writing at the conclusion of the visit. If applicable, a reminder letter will be sent to the patient regarding the next appointment. BI-RADS CATEGORY  5: Highly suggestive of malignancy. Electronically Signed   By: Curlene Dolphin M.D.   On: 04/04/2017 17:07   US Breast Ltd Uni Right Inc Axilla  Result Date: 04/04/2017 CLINICAL DATA:  83 year old patient presents with a worsening palpable lump and pain in the left breast. She has also noted skin thickening of the left breast and has developed multiple superficial nodules in the upper outer left breast and some skin dimpling, inferior to the left axilla. She has also palpated a lump more medially in the left axilla. She describes that the pain also includes her left shoulder and left back. Her right breast is asymptomatic. In February 2016, bilateral breast biopsies were recommended, and when the patient returned to undergo the biopsies, she decided not to proceed with him. No biopsy was performed. She has not followed up since that time. She is concerned about potential pain with biopsy. The patient is a retired Equities trader. Her daughter presents with her today and is in the ultrasound room with her. EXAM:  2D DIGITAL DIAGNOSTIC BILATERAL MAMMOGRAM WITH CAD AND ADJUNCT TOMO ULTRASOUND BILATERAL BREAST COMPARISON:  January 27, 2015 and February 14, 2015 ACR Breast Density Category b: There are scattered areas of fibroglandular density. FINDINGS: There has been a significant interval change in the appearance of the left breast. The mass previously described and recommended for biopsy in the  upper-outer quadrant of the left breast has enlarged. The mass is spiculated. A portion of the mass is deep to the imaging field. The images are to the best of the patient's ability but do not contain the pectoralis muscle. The spiculated mass corresponds to the palpable lump, as denoted by the overlying skin marker.I do not detect a definite discrete mass in the medial left breast. Previously on the mammogram of February 14, 2015 a small mass was questioned in the medial left breast. There is now increased trabeculation throughout the left breast and diffuse left breast skin thickening. In the upper inner quadrant of the right breast posteriorly, a group of heterogeneous calcifications has now span approximately 2.5 x 2.4 x 2.9 cm. These were previously recommended for biopsy. There are stable benign appearing calcifications in the retroareolar right breast. No mass or architectural distortion is identified on the right. On physical exam, there is diffuse peau de orange of the left breast. There are visible small skin nodules in the far upper outer quadrant of the left breast, approximately mid axillary line, where there is some focal skin dimpling. The skin nodules are firm to palpation. There is a firm palpable mass in the upper-outer quadrant of the right breast centered at 2 o'clock position approximately 12 cm from the nipple that measures approximately 5 cm on physical exam. There is palpable lymphadenopathy in the inferior and slightly medial left axilla. No mass is palpated in the upper inner quadrant of the right breast. The skin of the right breast appears normal. Targeted ultrasound is performed, showing a markedly irregular hypoechoic mass in the 2 o'clock position left breast 12 cm from nipple measuring approximately 3.9 x 3.8 x 3.1 cm. There is diffuse skin thickening of the left breast measuring up to 5 mm in thickness. In the region of the visible skin nodules there are some vague hypoechoic areas  within the thickened skin that demonstrates vascular flow, at 2:30 position near the mid axillary line. In the medial and lower left axilla is a hypoechoic mass with irregular margins measuring 1.0 x 0.9 x 1.3 cm, favored to be a lymph node with metastasis and possible extracapsular extension. Ultrasound of the lower inner quadrant of the left breast was attempted to follow the 6 x 3 x 3 mm mass described at 8 o'clock position 4 cm from the nipple on the exam of March 2016. I do not detect a mass in the 8 o'clock region or remainder of the lower inner quadrant of the left breast on today's ultrasound. Ultrasound of the right breast is performed to attempt to identify the patient's suspicious microcalcifications by ultrasound. These are not clearly visualized. No mass is seen in the upper inner quadrant of the right breast. The right axilla is negative for lymphadenopathy. Mammographic images were processed with CAD. IMPRESSION: Clinical and imaging findings highly suggestive of a neglected locally advanced left breast cancer with skin involvement and possible left axillary nodal metastasis. Findings highly suspicious for ductal carcinoma in situ in the upper inner quadrant of the right breast. RECOMMENDATION: Ultrasound-guided biopsies of the palpable left breast mass and suspicious left axillary lymph  node are recommended. Biopsies were offered to the patient today. Although the patient's daughter encouraged her to stay for biopsies, the patient declined to have been performed today. Stereotactic biopsy of the suspicious microcalcifications in the upper inner quadrant of the right breast is suggested. It was explained to the patient today that the stereotactic biopsy technique has changed since the last time she was here and she will be able to sit upright for the procedure. The patient's biopsies have been scheduled for next week on Tuesday, April 08, 2017. She was counseled about the procedures for biopsy, and  the typical mild stinging sensation associated with lidocaine, and that most patients do very well, with no pain to minimal pain during the biopsy, after lidocaine has been administered. I telephoned Dr. Vista Lawman with the findings and recommendations for biopsy today. He will see the patient today and encouraged her to return for biopsies. I have discussed the findings and recommendations with the patient. Results were also provided in writing at the conclusion of the visit. If applicable, a reminder letter will be sent to the patient regarding the next appointment. BI-RADS CATEGORY  5: Highly suggestive of malignancy. Electronically Signed   By: Curlene Dolphin M.D.   On: 04/04/2017 17:07   Mm Diag Breast Tomo Bilateral  Result Date: 04/04/2017 CLINICAL DATA:  82 year old patient presents with a worsening palpable lump and pain in the left breast. She has also noted skin thickening of the left breast and has developed multiple superficial nodules in the upper outer left breast and some skin dimpling, inferior to the left axilla. She has also palpated a lump more medially in the left axilla. She describes that the pain also includes her left shoulder and left back. Her right breast is asymptomatic. In February 2016, bilateral breast biopsies were recommended, and when the patient returned to undergo the biopsies, she decided not to proceed with him. No biopsy was performed. She has not followed up since that time. She is concerned about potential pain with biopsy. The patient is a retired Equities trader. Her daughter presents with her today and is in the ultrasound room with her. EXAM: 2D DIGITAL DIAGNOSTIC BILATERAL MAMMOGRAM WITH CAD AND ADJUNCT TOMO ULTRASOUND BILATERAL BREAST COMPARISON:  January 27, 2015 and February 14, 2015 ACR Breast Density Category b: There are scattered areas of fibroglandular density. FINDINGS: There has been a significant interval change in the appearance of the left breast. The  mass previously described and recommended for biopsy in the upper-outer quadrant of the left breast has enlarged. The mass is spiculated. A portion of the mass is deep to the imaging field. The images are to the best of the patient's ability but do not contain the pectoralis muscle. The spiculated mass corresponds to the palpable lump, as denoted by the overlying skin marker.I do not detect a definite discrete mass in the medial left breast. Previously on the mammogram of February 14, 2015 a small mass was questioned in the medial left breast. There is now increased trabeculation throughout the left breast and diffuse left breast skin thickening. In the upper inner quadrant of the right breast posteriorly, a group of heterogeneous calcifications has now span approximately 2.5 x 2.4 x 2.9 cm. These were previously recommended for biopsy. There are stable benign appearing calcifications in the retroareolar right breast. No mass or architectural distortion is identified on the right. On physical exam, there is diffuse peau de orange of the left breast. There are visible small skin nodules  in the far upper outer quadrant of the left breast, approximately mid axillary line, where there is some focal skin dimpling. The skin nodules are firm to palpation. There is a firm palpable mass in the upper-outer quadrant of the right breast centered at 2 o'clock position approximately 12 cm from the nipple that measures approximately 5 cm on physical exam. There is palpable lymphadenopathy in the inferior and slightly medial left axilla. No mass is palpated in the upper inner quadrant of the right breast. The skin of the right breast appears normal. Targeted ultrasound is performed, showing a markedly irregular hypoechoic mass in the 2 o'clock position left breast 12 cm from nipple measuring approximately 3.9 x 3.8 x 3.1 cm. There is diffuse skin thickening of the left breast measuring up to 5 mm in thickness. In the region of the  visible skin nodules there are some vague hypoechoic areas within the thickened skin that demonstrates vascular flow, at 2:30 position near the mid axillary line. In the medial and lower left axilla is a hypoechoic mass with irregular margins measuring 1.0 x 0.9 x 1.3 cm, favored to be a lymph node with metastasis and possible extracapsular extension. Ultrasound of the lower inner quadrant of the left breast was attempted to follow the 6 x 3 x 3 mm mass described at 8 o'clock position 4 cm from the nipple on the exam of March 2016. I do not detect a mass in the 8 o'clock region or remainder of the lower inner quadrant of the left breast on today's ultrasound. Ultrasound of the right breast is performed to attempt to identify the patient's suspicious microcalcifications by ultrasound. These are not clearly visualized. No mass is seen in the upper inner quadrant of the right breast. The right axilla is negative for lymphadenopathy. Mammographic images were processed with CAD. IMPRESSION: Clinical and imaging findings highly suggestive of a neglected locally advanced left breast cancer with skin involvement and possible left axillary nodal metastasis. Findings highly suspicious for ductal carcinoma in situ in the upper inner quadrant of the right breast. RECOMMENDATION: Ultrasound-guided biopsies of the palpable left breast mass and suspicious left axillary lymph node are recommended. Biopsies were offered to the patient today. Although the patient's daughter encouraged her to stay for biopsies, the patient declined to have been performed today. Stereotactic biopsy of the suspicious microcalcifications in the upper inner quadrant of the right breast is suggested. It was explained to the patient today that the stereotactic biopsy technique has changed since the last time she was here and she will be able to sit upright for the procedure. The patient's biopsies have been scheduled for next week on Tuesday, April 08, 2017. She was counseled about the procedures for biopsy, and the typical mild stinging sensation associated with lidocaine, and that most patients do very well, with no pain to minimal pain during the biopsy, after lidocaine has been administered. I telephoned Dr. Vista Lawman with the findings and recommendations for biopsy today. He will see the patient today and encouraged her to return for biopsies. I have discussed the findings and recommendations with the patient. Results were also provided in writing at the conclusion of the visit. If applicable, a reminder letter will be sent to the patient regarding the next appointment. BI-RADS CATEGORY  5: Highly suggestive of malignancy. Electronically Signed   By: Curlene Dolphin M.D.   On: 04/04/2017 17:07   [place] Axillary Node Core Biopsy Left  Addendum Date: 04/09/2017   ADDENDUM REPORT: 04/09/2017  14:46 ADDENDUM: Pathology revealed GRADE II INVASIVE MAMMARY CARCINOMA of the Left breast, 2:00 o'clock. INVASIVE MAMMARY CARCINOMA of the Left axillary node. 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 This was found to be concordant by Dr. Ammie Ferrier. Pathology results were discussed with the patient's daughter, Samantha Abbott by telephone, per patient request. The patient reported her mother did well after the biopsies with tenderness and minimal bleeding at the sites. Post biopsy instructions and care were reviewed and questions were answered. The patient's daughter was encouraged to call [facility] for any additional concerns. Surgical consultation has been arranged with Dr. Nedra Hai at [facility] on April 15, 2017. Pathology results reported by Terie Purser, RN on 04/09/2017. Electronically Signed   By: Ammie Ferrier M.D.   On: 04/09/2017 14:46   Result Date: 04/09/2017 CLINICAL DATA:  83 year old female presenting for ultrasound-guided biopsy  of a palpable left breast mass and a left axillary lymph node. EXAM: ULTRASOUND GUIDED LEFT BREAST CORE NEEDLE BIOPSY COMPARISON:  Previous exam(s). FINDINGS: I met with the patient and we discussed the procedure of ultrasound-guided biopsy, including benefits and alternatives. We discussed the high likelihood of a successful procedure. We discussed the risks of the procedure, including infection, bleeding, tissue injury, clip migration, and inadequate sampling. Informed written consent was given. The usual time-out protocol was performed immediately prior to the procedure. Lesion quadrant: Upper-outer Using sterile technique and 1% Lidocaine as local anesthetic, under direct ultrasound visualization, a 14 gauge spring-loaded device was used to perform biopsy of a left breast mass at 2 o'clock using an inferior approach. At the conclusion of the procedure a ribbon shaped tissue marker clip was deployed into the biopsy cavity. Using sterile technique and 1% Lidocaine as local anesthetic, under direct ultrasound visualization, a 14 gauge spring-loaded device was used to perform biopsy of a left axillary lymph node using an inferior approach. At the conclusion of the procedure a spiral shaped tissue marker clip was deployed into the biopsy cavity. Follow up 2 view mammogram was performed and dictated separately. IMPRESSION: 1. Ultrasound guided biopsy of left breast mass at 2 o'clock. No apparent complications. 2. Ultrasound-guided biopsy of a left axillary lymph node. No apparent complications. 3. A stereotactic right breast biopsy was also performed today, which will be dictated under a separate report. Electronically Signed: By: Ammie Ferrier M.D. On: 04/08/2017 15:30   Mm Clip Placement Left  Result Date: 04/08/2017 CLINICAL DATA:  Post biopsy mammogram of the bilateral breasts for clip placement. EXAM: DIAGNOSTIC BILATERAL MAMMOGRAM POST ULTRASOUND BIOPSY OF THE LEFT BREAST AND STEREOTACTIC BIOPSY OF THE  RIGHT BREAST COMPARISON:  Previous exam(s). FINDINGS: Mammographic images were obtained following stereotactic guided biopsy of calcifications in the upper inner right breast. The coil shaped biopsy marking clip is appropriately positioned at the site of biopsy in the upper inner right breast. Mammographic images were obtained following ultrasound-guided biopsy of a left breast mass and left axillary lymph node. The ribbon shaped biopsy marking clip within the biopsied mass at 2 o'clock is appropriately positioned. The spiral shaped biopsy marking clip within lymph node in the left axilla is not visualized on the mammographic images. IMPRESSION: 1. Appropriate positioning of the coil shaped biopsy marking clip at the site of biopsied calcifications in the upper inner right breast. 2. Appropriate positioning of the ribbon shaped biopsy marking clip in the left breast mass at 2 o'clock. 3.  The spiral shaped biopsy marking clip which was placed in the left axillary lymph node is not visible on these images. Final Assessment: Post Procedure Mammograms for Marker Placement Electronically Signed   By: Ammie Ferrier M.D.   On: 04/08/2017 15:54   Mm Clip Placement Right  Result Date: 04/08/2017 CLINICAL DATA:  Post biopsy mammogram of the bilateral breasts for clip placement. EXAM: DIAGNOSTIC BILATERAL MAMMOGRAM POST ULTRASOUND BIOPSY OF THE LEFT BREAST AND STEREOTACTIC BIOPSY OF THE RIGHT BREAST COMPARISON:  Previous exam(s). FINDINGS: Mammographic images were obtained following stereotactic guided biopsy of calcifications in the upper inner right breast. The coil shaped biopsy marking clip is appropriately positioned at the site of biopsy in the upper inner right breast. Mammographic images were obtained following ultrasound-guided biopsy of a left breast mass and left axillary lymph node. The ribbon shaped biopsy marking clip within the biopsied mass at 2 o'clock is appropriately positioned. The spiral shaped  biopsy marking clip within lymph node in the left axilla is not visualized on the mammographic images. IMPRESSION: 1. Appropriate positioning of the coil shaped biopsy marking clip at the site of biopsied calcifications in the upper inner right breast. 2. Appropriate positioning of the ribbon shaped biopsy marking clip in the left breast mass at 2 o'clock. 3. The spiral shaped biopsy marking clip which was placed in the left axillary lymph node is not visible on these images. Final Assessment: Post Procedure Mammograms for Marker Placement Electronically Signed   By: Ammie Ferrier M.D.   On: 04/08/2017 15:54   [place] Lt Breast Bx W Loc Dev 1st Lesion Img Bx Spec US Guide  Addendum Date: 04/09/2017   ADDENDUM REPORT: 04/09/2017 14:46 ADDENDUM: Pathology revealed GRADE II INVASIVE MAMMARY CARCINOMA of the Left breast, 2:00 o'clock. INVASIVE MAMMARY CARCINOMA of the Left axillary node. 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 This was found to be concordant by Dr. Ammie Ferrier. Pathology results were discussed with the patient's daughter, Samantha Abbott by telephone, per patient request. The patient reported her mother did well after the biopsies with tenderness and minimal bleeding at the sites. Post biopsy instructions and care were reviewed and questions were answered. The patient's daughter was encouraged to call [facility] for any additional concerns. Surgical consultation has been arranged with Dr. Nedra Hai at [facility] on April 15, 2017. Pathology results reported by Terie Purser, RN on 04/09/2017. Electronically Signed   By: Ammie Ferrier M.D.   On: 04/09/2017 14:46   Result Date: 04/09/2017 CLINICAL DATA:  83 year old female presenting for ultrasound-guided biopsy of a palpable left breast mass and a left axillary lymph node. EXAM: ULTRASOUND GUIDED LEFT BREAST CORE NEEDLE  BIOPSY COMPARISON:  Previous exam(s). FINDINGS: I met with the patient and we discussed the procedure of ultrasound-guided biopsy, including benefits and alternatives. We discussed the high likelihood of a successful procedure. We discussed the risks of the procedure, including infection, bleeding, tissue injury, clip migration, and inadequate sampling. Informed written consent was given. The usual time-out protocol was performed immediately prior to the procedure. Lesion quadrant: Upper-outer Using sterile technique and 1% Lidocaine as local anesthetic, under direct ultrasound visualization, a 14 gauge spring-loaded device was used to perform biopsy of a left breast mass at 2 o'clock using an inferior approach. At the conclusion of the procedure a ribbon shaped tissue marker clip was deployed into the biopsy cavity. Using sterile technique and 1%  Lidocaine as local anesthetic, under direct ultrasound visualization, a 14 gauge spring-loaded device was used to perform biopsy of a left axillary lymph node using an inferior approach. At the conclusion of the procedure a spiral shaped tissue marker clip was deployed into the biopsy cavity. Follow up 2 view mammogram was performed and dictated separately. IMPRESSION: 1. Ultrasound guided biopsy of left breast mass at 2 o'clock. No apparent complications. 2. Ultrasound-guided biopsy of a left axillary lymph node. No apparent complications. 3. A stereotactic right breast biopsy was also performed today, which will be dictated under a separate report. Electronically Signed: By: Ammie Ferrier M.D. On: 04/08/2017 15:30   Mm Rt Breast Bx W Loc Dev 1st Lesion Image Bx Spec Stereo Guide  Result Date: 04/08/2017 CLINICAL DATA:  83 year old female presenting for stereotactic biopsy of right breast calcifications. EXAM: RIGHT BREAST STEREOTACTIC CORE NEEDLE BIOPSY COMPARISON:  Previous exams. FINDINGS: The patient and I discussed the procedure of stereotactic-guided biopsy  including benefits and alternatives. We discussed the high likelihood of a successful procedure. We discussed the risks of the procedure including infection, bleeding, tissue injury, clip migration, and inadequate sampling. Informed written consent was given. The usual time out protocol was performed immediately prior to the procedure. Using sterile technique and 1% Lidocaine as local anesthetic, under stereotactic guidance, a 9 gauge vacuum assisted device was used to perform core needle biopsy of calcifications in the upper inner quadrant of the right breast using a superior approach. Specimen radiograph was performed showing calcifications within multiple core samples. Specimens with calcifications are identified for pathology. Lesion quadrant: Upper-inner At the conclusion of the procedure, a coil shaped tissue marker clip was deployed into the biopsy cavity. Follow-up 2-view mammogram was performed and dictated separately. IMPRESSION: 1. Stereotactic-guided biopsy of calcifications in the upper inner right breast. No apparent complications. 2. A double ultrasound-guided biopsy was performed of the left breast today, and was dictated in a separate report. Electronically Signed   By: Ammie Ferrier M.D.   On: 04/08/2017 15:29    ELIGIBLE FOR AVAILABLE RESEARCH PROTOCOL:   ASSESSMENT: 83 y.o.  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 The patient has a long neglected breast cancer which is locally advanced. She may well have metastatic disease at this point, but the likelihood of disseminated disease, at least microscopic, is high. We are going to start with a CT scan of the chest and brain and a bone scan for staging.  Whether her tumor is at all curable remains a question, but it should be controllable, I Abbott, and in that regard we reviewed the possible treatments of breast cancer including local and systemic. As far as systemic therapy is concerned we are going to start with anti-estrogens and specifically she will start anastrozole today. She has a good understanding of the possible toxicities, side effects and complications of this agent. We also discussed issues of cost.  She will then return 04/29/2017 to receive  her first fulvestrant dose area normally we would follow this up in 2 weeks but she is going to be in [place] between May 17 in May 27 4 grandsons high school graduation. Accordingly her second dose of fulvestrant will be 05/20/2017 and we will add a 2 week dose after that. From that point she will receive fulvestrant on an every four-week basis.  On 05/20/2017 also we will consider starting palbociclib. Again, the side effects toxicities and complications of all these agents were discussed today.  The goal is to control the tumor and if possible because it to shrink. If we obtain a very good response, the possibility of surgery could be considered and the patient likely will see Dr. Johnathan Hausen after 2 or 3 months on the current treatment for an initial assessment. Dr. Hassell Done was her GI surgeon and she thinks very highly of him.  I have made the patient a return appointment with me on 05/20/2017. I have asked her to call with any symptoms that she may develop from the anastrozole starting today.    Ladonne has a good understanding of the overall  plan. She agrees with it. She knows the goal of treatment in her case is cure. She will call with any problems that may develop before her next visit here.  Chauncey Cruel, MD   04/23/2017 8:01 PM Medical Oncology and Hematology [facility] [address] [place], [place] [address] Tel. [phone]    Fax. [phone]

## 2017-04-24 ENCOUNTER — Telehealth: Payer: Self-pay | Admitting: Radiation Oncology

## 2017-04-24 ENCOUNTER — Telehealth: Payer: Self-pay | Admitting: *Deleted

## 2017-04-24 ENCOUNTER — Ambulatory Visit: Payer: Medicare Other | Admitting: Radiation Oncology

## 2017-04-24 DIAGNOSIS — C50412 Malignant neoplasm of upper-outer quadrant of left female breast: Secondary | ICD-10-CM

## 2017-04-24 DIAGNOSIS — Z170 Estrogen receptor positive status [ER+]: Principal | ICD-10-CM

## 2017-04-24 MED ORDER — ONDANSETRON HCL 8 MG PO TABS: 8.0000 mg | ORAL_TABLET | Freq: Three times a day (TID) | ORAL | 1 refills | Status: DC | PRN

## 2017-04-24 MED ORDER — OXYCODONE HCL 5 MG PO TABS: 5.0000 mg | ORAL_TABLET | ORAL | 0 refills | Status: DC | PRN

## 2017-04-24 NOTE — Telephone Encounter (Signed)
The patient's daughter, and there are been issues with the patient's prescription, they cannot obtain E capsules for oxycodone (not covered by her insurance however the tablets are. A new prescription will be printed. Prescription for Zofran also did not go to her pharmacy and this will be added to the prescription for her narcotic pain medicine. We discussed Dr. Virgie Dad recommendations for antiestrogen, and the patient is more open to considering surgery if she has a good response from antiestrogen therapy. As a result of this, we will cancel plans to move forward with palliative radiation time, scheduling his notified as well as simulation.

## 2017-04-24 NOTE — Telephone Encounter (Signed)
Identified pt as being eligible for a one time blood draw and data collection study.  The study is titled, "Procurement of human biospecimens for the discovery and validation of biomarkers for the prediction, diagnosis and management of disease." Called pt's phone number and her daughter, [name] answered.  [name] states she is the contact person for her mother and she is listed on pt's release of information form.   Informed [name] pt is eligible to participate in this voluntary study which involves one time blood draw prior to starting on any treatment for her breast cancer.  Informed her that participants will be given a $50 debit card for their participation.  Pt's daughter states pt has not started on treatment with Anastrozole yet.  They just picked it up from the pharmacy. Pt plans to start it today or tomorrow.  She asked research nurse to call her back in a few minutes so she could ask her mother if she wants to come to [place] lab to have her blood drawn for the study before she starts on her treatment.  I called Chiquita back after 15 minutes and she said her mother does not want to come in for the blood draw.  She is interested, but it is too difficult for her physically to come for just this one appointment.  Thanked pt's daughter very much for her time and willingness to speak to her mother about this and asked her to thank her mother for [place] as welll.   Foye Spurling, BSN, RN Clinical Research Nurse 04/24/2017 2:21 PM

## 2017-04-28 NOTE — Addendum Note (Signed)
Encounter addended by: Kyung Rudd, MD on: 04/28/2017  9:17 AM<BR>    Actions taken: Cosign clinical note with attestation

## 2017-04-29 ENCOUNTER — Other Ambulatory Visit (HOSPITAL_BASED_OUTPATIENT_CLINIC_OR_DEPARTMENT_OTHER): Payer: Medicare Other

## 2017-04-29 ENCOUNTER — Ambulatory Visit (HOSPITAL_BASED_OUTPATIENT_CLINIC_OR_DEPARTMENT_OTHER): Payer: Medicare Other

## 2017-04-29 VITALS — BP 160/61 | HR 63 | Temp 98.0°F | Resp 18

## 2017-04-29 DIAGNOSIS — C50412 Malignant neoplasm of upper-outer quadrant of left female breast: Secondary | ICD-10-CM

## 2017-04-29 DIAGNOSIS — Z170 Estrogen receptor positive status [ER+]: Principal | ICD-10-CM

## 2017-04-29 DIAGNOSIS — Z5111 Encounter for antineoplastic chemotherapy: Secondary | ICD-10-CM | POA: Diagnosis present

## 2017-04-29 LAB — CBC WITH DIFFERENTIAL/PLATELET
BASO%: 0.7 % (ref 0.0–2.0)
Basophils Absolute: 0.1 10*3/uL (ref 0.0–0.1)
EOS%: 3.5 % (ref 0.0–7.0)
Eosinophils Absolute: 0.4 10*3/uL (ref 0.0–0.5)
HEMATOCRIT: 36.2 % (ref 34.8–46.6)
HEMOGLOBIN: 11.8 g/dL (ref 11.6–15.9)
LYMPH%: 16.8 % (ref 14.0–49.7)
MCH: 30.5 pg (ref 25.1–34.0)
MCHC: 32.6 g/dL (ref 31.5–36.0)
MCV: 93.6 fL (ref 79.5–101.0)
MONO#: 0.4 10*3/uL (ref 0.1–0.9)
MONO%: 4.4 % (ref 0.0–14.0)
NEUT#: 7.6 10*3/uL — ABNORMAL HIGH (ref 1.5–6.5)
NEUT%: 74.6 % (ref 38.4–76.8)
Platelets: 291 10*3/uL (ref 145–400)
RBC: 3.87 10*6/uL (ref 3.70–5.45)
RDW: 13.9 % (ref 11.2–14.5)
WBC: 10.2 10*3/uL (ref 3.9–10.3)
lymph#: 1.7 10*3/uL (ref 0.9–3.3)

## 2017-04-29 LAB — COMPREHENSIVE METABOLIC PANEL
ALT: 8 U/L (ref 0–55)
AST: 29 U/L (ref 5–34)
Albumin: 3.1 g/dL — ABNORMAL LOW (ref 3.5–5.0)
Alkaline Phosphatase: 158 U/L — ABNORMAL HIGH (ref 40–150)
Anion Gap: 11 mEq/L (ref 3–11)
BUN: 24.8 mg/dL (ref 7.0–26.0)
CHLORIDE: 108 meq/L (ref 98–109)
CO2: 24 mEq/L (ref 22–29)
CREATININE: 2.5 mg/dL — AB (ref 0.6–1.1)
Calcium: 11.2 mg/dL — ABNORMAL HIGH (ref 8.4–10.4)
EGFR: 19 mL/min/{1.73_m2} — ABNORMAL LOW (ref >90)
Glucose: 130 mg/dl (ref 70–140)
Potassium: 3.7 mEq/L (ref 3.5–5.1)
Sodium: 142 mEq/L (ref 136–145)
Total Bilirubin: 0.67 mg/dL (ref 0.20–1.20)
Total Protein: 7.6 g/dL (ref 6.4–8.3)

## 2017-04-29 MED ORDER — FULVESTRANT 250 MG/5ML IM SOLN
500.0000 mg | Freq: Once | INTRAMUSCULAR | Status: AC
  Administered 2017-04-29: 500 mg via INTRAMUSCULAR
  Filled 2017-04-29: qty 10

## 2017-05-02 ENCOUNTER — Encounter: Payer: Self-pay | Admitting: *Deleted

## 2017-05-02 NOTE — Progress Notes (Signed)
[place] Work  Clinical Social Work was referred by nurse for assessment of psychosocial needs as daughter from [place] arrived at clinic with concerns for her mother.  Clinical Social Worker met with daughter, Samantha Abbott patient at her request. Daughter had been to APS earlier to explain her concerns, they suggested she inquire about respite placement through [facility] as there is NO abuse or neglect concerns. Per daughter, pt lives in her own home and it is dirty, son smokes in the home and there are insects.  CSW explained to daughter that pt has a right to live in her own home and keep as clean or as dirty as she would like. Daughter does not feel her brother does "enough to care for mom", but denies abuse. CSW explained there are no paid for respite options and that pt would have to pay for additional caregiver support or relocation. CSW explained that medicare does not pay for ALF and how she DOES NOT qualify for any type of nursing home stay. CSW did not disclose any pt information, only shared resource information as daughter is not on contact list.     Clinical Social Work interventions: Resource education  Loren Racer, [place], OSW-C Clinical Social Worker [place]  [place] Phone: [phone] Fax: [phone]

## 2017-05-06 ENCOUNTER — Ambulatory Visit (HOSPITAL_COMMUNITY)
Admission: RE | Admit: 2017-05-06 | Discharge: 2017-05-06 | Disposition: A | Discharge status: Home / Self Care | Payer: Medicare Other | Source: Ambulatory Visit | Attending: Oncology | Admitting: Oncology

## 2017-05-06 DIAGNOSIS — C7972 Secondary malignant neoplasm of left adrenal gland: Secondary | ICD-10-CM | POA: Insufficient documentation

## 2017-05-06 DIAGNOSIS — C787 Secondary malignant neoplasm of liver and intrahepatic bile duct: Secondary | ICD-10-CM | POA: Insufficient documentation

## 2017-05-06 DIAGNOSIS — C50412 Malignant neoplasm of upper-outer quadrant of left female breast: Secondary | ICD-10-CM | POA: Insufficient documentation

## 2017-05-06 DIAGNOSIS — Z170 Estrogen receptor positive status [ER+]: Principal | ICD-10-CM

## 2017-05-06 DIAGNOSIS — C7800 Secondary malignant neoplasm of unspecified lung: Secondary | ICD-10-CM | POA: Diagnosis not present

## 2017-05-06 DIAGNOSIS — C7951 Secondary malignant neoplasm of bone: Secondary | ICD-10-CM | POA: Diagnosis not present

## 2017-05-06 MED ORDER — TECHNETIUM TC 99M MEDRONATE IV KIT: 25.0000 | PACK | Freq: Once | INTRAVENOUS | Status: DC | PRN

## 2017-05-12 ENCOUNTER — Other Ambulatory Visit: Payer: Self-pay | Admitting: Oncology

## 2017-05-12 NOTE — Progress Notes (Unsigned)
[place]  [phone] [phone]     ID: Samantha Abbott DOB: February 26, 1978  MR#: [id number]  INO#:[id number]  Patient Care Team: Benito Mccreedy, MD as PCP - General (Internal Medicine) Magrinat, Virgie Dad, MD as Consulting Physician (Oncology) Johnathan Hausen, MD as Consulting Physician (General Surgery) Chauncey Cruel, MD OTHER MD:  CHIEF COMPLAINT: Estrogen receptor positive breast cancer  CURRENT TREATMENT: anastrozole, fulvestrant, palbociclib   BREAST CANCER HISTORY: The patient had mammography 02/14/2015 showing a small mass in the medial left breast. She refused biopsy at that time. More recently the patient noted a worsening lump in the left breast, with skin thickening and superficial nodules, as well as a lump in her left axilla. When this became painful she brought it to medical attention and on 04/04/2017 underwent bilateral diagnostic mammography with tomography and left sided ultrasonography. The breast density was category B.  In the upper-outer quadrant of the left breast there was a spiculated mass corresponding to the patient's palpable lump. This is not exactly where the mass had previously been noted in 2016. Exam showed diffuse peau d'orange of the left breast with skin nodules in the far upper outer quadrant firm to palpation. There was palpable lymphadenopathy in the inferior and medial left axilla.  Ultrasound of the left breast confirmed an irregular hypoechoic mass at the 2:00 position 12 cm from the nipple measuring 3.9 cm, and in the region of visible skin nodules there were some hypoechoic areas at the 2:30 o'clock position near the midaxillary line. Ultrasound of the lower inner quadrant of the left breast found a 0.6 cm mass at the 8:00 position 4 cm from the nipple. In the lower left axilla there was a 1.0 cm mass suspicious for metastatic spread.  In the upper inner quadrant of the right breast there was a group  of calcifications spanning 2.9 cm, which had been previously recommended by biopsy, which the patient refused. There was no mass or architectural distortion on the right ultrasound of the right breast and right axilla were otherwise negative.  On 04/08/2017 the patient underwent biopsy of the main mass in the left breast at 2:00, and left axillary lymph node, and the area of interest in the upper inner quadrant of the right breast. The left breast mass and lymph node both showed invasive ductal carcinoma, E-cadherin positive, with the prognostic profile from the breast mass showing the tumor to be estrogen receptor 100% positive with strong staining intensity, progesterone receptor 10% positive with moderate staining intensity, with an MIB-1 of 60%, and no HER-2 amplification, the signals ratio being 1.18 and the number per cell 1.30. 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   The patient's subsequent history is as detailed below  INTERVAL HISTORY: The patient was evaluated in the breast clinic 04/23/2017 accompanied by her niece Samantha Abbott and her daughter-in-law Samantha Abbott: Aside from the mass itself, there were no specific symptoms leading to the diagnostic mammogram.The patient has had persistent nausea for the past week, but denies unusual headaches, visual changes,  vomiting, stiff neck, dizziness, or gait imbalance. There have been no falls. At home she uses a walker. There has been no cough, phlegm production, or pleurisy, no chest pain or pressure, and no change in bowel or bladder habits. The patient denies fever, rash, bleeding, unexplained fatigue or unexplained weight loss. A  detailed review of systems was otherwise entirely negative.   PAST MEDICAL HISTORY: Past Medical History:  Diagnosis Date  . Abdominal pain   . Abnormal  heart rhythm   . Arthritis   . Dysrhythmia   . Esophageal ulcer   . Flatulence, eructation, and gas pain   . GERD (gastroesophageal reflux disease)    with esophageal ulcer in past  . Hypertension    LOV WITH EKG DR [facility] RECEIVED 12/02/11 1530 and placed on chart  . Iron deficiency anemia   . Myocardial infarction [facility])    silent MI 1961/ states anterior wall  . Nausea   . Sinus problem   . Vomiting   . Wears glasses     PAST SURGICAL HISTORY: Past Surgical History:  Procedure Laterality Date  . ABDOMINAL HYSTERECTOMY    . BOWEL RESECTION  03/24/2012   Procedure: SMALL BOWEL RESECTION;  Surgeon: Pedro Earls, MD;  Location: [facility] ORS;  Service: General;  Laterality: N/A;  . BREAST EXCISIONAL BIOPSY Left   . BREAST EXCISIONAL BIOPSY Right   . CHOLECYSTECTOMY    . COLON SURGERY  12/06/11   lap sigmoid colectomy   . COLONOSCOPY    . FOOT SURGERY      FAMILY HISTORY Family History  Problem Relation Age of Onset  . Cancer Mother        breast cancer  . Breast cancer Mother 54  . Heart disease Father        heart attack   The patient's father died from heart disease at age 67. The patient's mother died at age 28 with breast cancer which had been diagnosed a couple of years before. The patient had no brothers or sisters and no family members that she knows of with breast or ovarian cancer  GYNECOLOGIC HISTORY:  No LMP recorded. Patient has had a hysterectomy. Menarche age 32 she is GX P0. She underwent hysterectomy in 1966. She tells me she still has her cervix in place.  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 She is widowed. All her children are adopted. With her lives her son Samantha Abbott was a security guard Samantha Abbott his wife Samantha Abbott who is disabled secondary to Crohn's disease and is the main caregiver in the home, as well as the patient's grandchildren who are 44, 78, and 57 years old. The 2 older ones work for  [employer], the younger 1 is helping a disabled person The ptient also has a daughter Samantha Abbott lives in [place]. patient is a member of the local [employer]     ADVANCED DIRECTIVES: Not in place. At the 04/23/2017 visit the patient was given the appropriate documents to complete and notarize at her discretion    HEALTH MAINTENANCE: Social History  Substance Use Topics  . Smoking status: Current Some Day Smoker    Packs/day: 0.50    Years: 50.00    Types: Cigarettes  . Smokeless tobacco: Never Used  . Alcohol use No     Colonoscopy:  PAP:  Bone density:   No Known Allergies  Current Outpatient Prescriptions  Medication Sig Dispense Refill  . anastrozole (ARIMIDEX) 1 MG tablet Take 1 tablet (1 mg total) by mouth daily. 90 tablet 4  . docusate sodium (COLACE) 100 MG capsule Take 100 mg by mouth 2 (two) times daily. Taking 1 in am and 1 in pm.    . hydrochlorothiazide (HYDRODIURIL) 12.5 MG tablet Take  2 tablets (25 mg total) by mouth daily. 30 tablet 3  . losartan (COZAAR) 100 MG tablet Take 100 mg by mouth daily.    . ondansetron (ZOFRAN) 8 MG tablet Take 1 tablet (8 mg total) by mouth every 8 (eight) hours as needed for nausea or vomiting. 30 tablet 1  . oxyCODONE (OXY IR/ROXICODONE) 5 MG immediate release tablet Take 1 tablet (5 mg total) by mouth every 4 (four) hours as needed for severe pain. 60 tablet 0  . polyethylene glycol (MIRALAX / GLYCOLAX) packet Take 17 g by mouth every other day.    . rosuvastatin (CRESTOR) 10 MG tablet Take 10 mg by mouth daily.     No current facility-administered medications for this visit.     OBJECTIVE:Older African-American woman examined in a wheelchair There were no vitals filed for this visit.   There is no height or weight on file to calculate BMI.    ECOG FS:1 - Symptomatic but completely ambulatory  Ocular: Sclerae unicteric, bilateral arcus senilis Ear-nose-throat: Oropharynx clear, dentition in poor repair Lymphatic: No cervical  or supraclavicular adenopathy Lungs no rales or rhonchi Heart regular rate and rhythm Abd soft, obese nontender, positive bowel sounds MSK no focal spinal tenderness, no joint edema Neuro: non-focal, well-oriented, appropriate affect Breasts: I do not palpate a mass in the right breast. The right axilla is benign. The left breast is imaged below. There is a palpable lymph node in the left axilla. Note that I do not see evidence of inflammatory breast cancer in today's exam and I do not see evidence of peau d'orange which had been noted on the mammography report.  Lateral right breast, 04/23/2017    LAB RESULTS:  CMP     Component Value Date/Time   NA 142 04/29/2017 1322   K 3.7 04/29/2017 1322   CL 107 04/16/2012 1205   CO2 24 04/29/2017 1322   GLUCOSE 130 04/29/2017 1322   BUN 24.8 04/29/2017 1322   CREATININE 2.5 (H) 04/29/2017 1322   CALCIUM 11.2 (H) 04/29/2017 1322   PROT 7.6 04/29/2017 1322   ALBUMIN 3.1 (L) 04/29/2017 1322   AST 29 04/29/2017 1322   ALT 8 04/29/2017 1322   ALKPHOS 158 (H) 04/29/2017 1322   BILITOT 0.67 04/29/2017 1322   GFRNONAA 59 (L) 03/25/2012 0330   GFRAA 69 (L) 03/25/2012 0330    No results found for: TOTALPROTELP, ALBUMINELP, A1GS, A2GS, BETS, BETA2SER, GAMS, MSPIKE, SPEI  No results found for: Nils Pyle, KAPLAMBRATIO  Lab Results  Component Value Date   WBC 10.2 04/29/2017   NEUTROABS 7.6 (H) 04/29/2017   HGB 11.8 04/29/2017   HCT 36.2 04/29/2017   MCV 93.6 04/29/2017   PLT 291 04/29/2017      Chemistry      Component Value Date/Time   NA 142 04/29/2017 1322   K 3.7 04/29/2017 1322   CL 107 04/16/2012 1205   CO2 24 04/29/2017 1322   BUN 24.8 04/29/2017 1322   CREATININE 2.5 (H) 04/29/2017 1322      Component Value Date/Time   CALCIUM 11.2 (H) 04/29/2017 1322   ALKPHOS 158 (H) 04/29/2017 1322   AST 29 04/29/2017 1322   ALT 8 04/29/2017 1322   BILITOT 0.67 04/29/2017 1322       No results found for:  LABCA2  No components found for: [id number]  No results for input(s): INR in the last 168 hours.  Urinalysis    Component Value Date/Time   COLORURINE YELLOW 04/16/2012 1205  APPEARANCEUR TURBID (A) 04/16/2012 1205   LABSPEC 1.025 04/16/2012 1205   PHURINE 5.0 04/16/2012 1205   GLUCOSEU NEG 04/16/2012 1205   HGBUR LARGE (A) 04/16/2012 1205   BILIRUBINUR MOD (A) 04/16/2012 1205   KETONESUR NEG 04/16/2012 1205   PROTEINUR 100 (A) 04/16/2012 1205   UROBILINOGEN 1 04/16/2012 1205   NITRITE NEG 04/16/2012 1205   LEUKOCYTESUR MOD (A) 04/16/2012 1205     STUDIES: Ct Head Wo Contrast  Result Date: 05/06/2017 CLINICAL DATA:  Staging breast cancer EXAM: CT HEAD WITHOUT CONTRAST TECHNIQUE: Contiguous axial images were obtained from the base of the skull through the vertex without intravenous contrast. COMPARISON:  None. FINDINGS: Brain: No visible swelling or masslike area to suggest metastatic disease, sensitivity diminished by lack of IV contrast. There is age congruent cerebral volume loss. No acute or remote infarct, hemorrhage, or hydrocephalus. Vascular: Atherosclerotic calcifications Skull: 19 mm diameter irregular lucency in the right frontal bone there are smaller additional lucencies including the left parietal bone on 10:18 and high right parietal bone on 10:25. Patient has widespread osseous metastatic disease on contemporaneous chest CT. Sinuses/Orbits: Negative Other: Density along the upper right parotid with rounded masslike areas on reformats, suggesting mass lesion. IMPRESSION: 1. No noncontrast CT evidence of brain metastasis. 2. Calvarial metastases. 3. Probable right parotid mass, but only seen on 1 slice. Electronically Signed   By: Monte Fantasia M.D.   On: 05/06/2017 09:10   Ct Chest Wo Contrast  Result Date: 05/06/2017 CLINICAL DATA:  Staging breast cancer.  New diagnosis April 2018 EXAM: CT CHEST WITHOUT CONTRAST TECHNIQUE: Multidetector CT imaging of the chest was  performed following the standard protocol without IV contrast. COMPARISON:  Chest x-ray 11/29/2011 FINDINGS: Cardiovascular: The heart is normal in size. No pericardial effusion. There is tortuosity, ectasia and calcification of the thoracic aorta. Three-vessel coronary artery calcifications are noted. Mediastinum/Nodes: Scattered small mediastinal lymph nodes, likely benign. 6 mm prevascular lymph node on image number 53. 7 mm precarinal lymph node on image number 60. The esophagus is grossly normal. Lungs/Pleura: Numerous small irregular pulmonary nodules atypical 5 likely metastatic disease. The largest lesion in the right lung is in the right lower lobe on image number 99 and measures 10.5 x 10 mm. The largest lesion in the left lung is in the left lower lobe on image number 70 8 measures 12.5 x 10 mm. Mild underlying emphysematous changes. No acute pulmonary findings. No pleural effusion. Upper Abdomen: Left adrenal gland lesion suspicious for metastasis. There is also an adjacent probable benign myelolipoma. Low-attenuation liver lesions consistent with metastatic disease. The largest lesion in the left lobe measures approximately 4 cm on image number 114. Chest wall/ Musculoskeletal: Large left lateral breast mass which is deep and close to the chest wall and consistent with patient's known breast cancer. This measures approximately 5 x 3.5 cm. Irregular 17 mm left axillary lymph node, likely metastatic disease. No supraclavicular lymphadenopathy. The right breast is grossly normal. Diffuse lytic osseous metastatic disease. No spinal canal compromise. Probable underlying changes of ankylosing spondylitis. IMPRESSION: 1. Large left lateral deep breast mass consistent with patient's known breast cancer. Adjacent axillary lymphadenopathy. 2. Diffuse metastatic disease involving the lungs, liver, left adrenal gland and bony structures. Electronically Signed   By: Marijo Sanes M.D.   On: 05/06/2017 09:43   Nm  Bone Scan Whole Body  Result Date: 05/06/2017 CLINICAL DATA:  Left breast malignancy. No skeletal complaints or history of trauma. EXAM: NUCLEAR MEDICINE WHOLE BODY BONE  SCAN TECHNIQUE: Whole body anterior and posterior images were obtained approximately 3 hours after intravenous injection of radiopharmaceutical. RADIOPHARMACEUTICALS:  Twin T mCi Technetium-54mMDP IV COMPARISON:  No previous bone scans for review. FINDINGS: There is adequate uptake of the radiopharmaceutical by the skeleton. There is adequate soft tissue clearance and renal activity observed. There is abnormal uptake within the right frontal bone. There is an area of increased uptake in the left parietal bone. There is mildly increased uptake in the mid to lower cervical spine. There is intensely increased uptake over the upper thoracic spine. There is mildly increased uptake to the left of midline in the mid lumbar spine. There is intensely increased uptake within the manubrium. There is mildly increased uptake in the lateral aspect of approximately the right third rib and the anterior aspect of approximately the right fifth rib. There is increased uptake in the posterior aspect of approximately the right fourth rib. Uptake within the pelvis is unremarkable. Upper extremity uptake is normal with exception of a small amount of increased uptake in the superior medial aspect of the body of the right scapula. There is moderately increased uptake in the midshaft of the left femur. There is mildly increased uptake associated with the right knee and right foot that is likely degenerative in nature. IMPRESSION: Foci of abnormal uptake within the calvarium bilaterally, the cervical, thoracic, and lumbar spine, bilateral ribs, manubrium, medial aspect of the right scapula, and mid left femoral shaft. These findings are worrisome for metastatic disease. Electronically Signed   By: David  JMartiniqueM.D.   On: 05/06/2017 12:05    ELIGIBLE FOR AVAILABLE  RESEARCH PROTOCOL:   ASSESSMENT: 83y.o. [place] woman status post left breast upper outer quadrant biopsy and left axillary lymph node biopsy 04/08/2017, both positive for invasive ductal carcinoma, grade 2, estrogen and progesterone receptor positive, HER-2 nonamplified, with an MIB-1 of 60%  (a) right breast upper inner quadrant biopsy showed ductal carcinoma in situ, high-grade, estrogen and progesterone receptor positive  (1) anastrozole started 04/23/2017  (2) fulvestrant to start 04/29/2017  (3) palbociclib to start 05/20/2017  PLAN: We spent the better part of today's hour-long appointment discussing the biology of breast cancer in general, and the specifics of the patient's tumor in particular. The patient has a long neglected breast cancer which is locally advanced. She may well have metastatic disease at this point, but the likelihood of disseminated disease, at least microscopic, is high. We are going to start with a CT scan of the chest and brain and a bone scan for staging.  Whether her tumor is at all curable remains a question, but it should be controllable, I hope, and in that regard we reviewed the possible treatments of breast cancer including local and systemic. As far as systemic therapy is concerned we are going to start with anti-estrogens and specifically she will start anastrozole today. She has a good understanding of the possible toxicities, side effects and complications of this agent. We also discussed issues of cost.  She will then return 04/29/2017 to receive her first fulvestrant dose area normally we would follow this up in 2 weeks [place]. Accordingly her second dose of fulvestrant will be 05/20/2017 and we will add a 2 week dose after that. From that point she will receive fulvestrant on an every four-week basis.  On 05/20/2017 also we will consider starting palbociclib.  Again,  the side effects toxicities and complications of all these agents were discussed today.  The goal is to control the tumor and if possible because it to shrink. If we obtain a very good response, the possibility of surgery could be considered and the patient likely will see Dr. Johnathan Abbott after 2 or 3 months on the current treatment for an initial assessment. Dr. Hassell Done was her GI surgeon and she thinks very highly of him.  I have made the patient a return appointment with me on 05/20/2017. I have asked her to call with any symptoms that she may develop from the anastrozole starting today.    Emiah has a good understanding of the overall plan. She agrees with it. She knows the goal of treatment in her case is cure. She will call with any problems that may develop before her next visit here.  Chauncey Cruel, MD   05/12/2017 3:11 PM Medical Oncology and Hematology [facility] [address] [place], [place] [address] Tel. [phone]    Fax. [phone]

## 2017-05-14 ENCOUNTER — Encounter (HOSPITAL_COMMUNITY): Payer: Self-pay

## 2017-05-14 ENCOUNTER — Emergency Department (HOSPITAL_COMMUNITY): Payer: Medicare Other

## 2017-05-14 ENCOUNTER — Inpatient Hospital Stay (HOSPITAL_COMMUNITY): Payer: Medicare Other

## 2017-05-14 ENCOUNTER — Inpatient Hospital Stay (HOSPITAL_COMMUNITY)
Admission: EM | Admit: 2017-05-14 | Discharge: 2017-05-23 | DRG: 871 | Disposition: A | Discharge status: Skilled Nursing Facility | Payer: Medicare Other | Attending: Internal Medicine | Admitting: Internal Medicine

## 2017-05-14 DIAGNOSIS — N179 Acute kidney failure, unspecified: Secondary | ICD-10-CM | POA: Diagnosis present

## 2017-05-14 DIAGNOSIS — C7800 Secondary malignant neoplasm of unspecified lung: Secondary | ICD-10-CM | POA: Diagnosis present

## 2017-05-14 DIAGNOSIS — D630 Anemia in neoplastic disease: Secondary | ICD-10-CM | POA: Diagnosis present

## 2017-05-14 DIAGNOSIS — C787 Secondary malignant neoplasm of liver and intrahepatic bile duct: Secondary | ICD-10-CM | POA: Diagnosis present

## 2017-05-14 DIAGNOSIS — E870 Hyperosmolality and hypernatremia: Secondary | ICD-10-CM | POA: Diagnosis not present

## 2017-05-14 DIAGNOSIS — Z170 Estrogen receptor positive status [ER+]: Secondary | ICD-10-CM | POA: Diagnosis not present

## 2017-05-14 DIAGNOSIS — I252 Old myocardial infarction: Secondary | ICD-10-CM

## 2017-05-14 DIAGNOSIS — Z66 Do not resuscitate: Secondary | ICD-10-CM | POA: Diagnosis present

## 2017-05-14 DIAGNOSIS — E872 Acidosis: Secondary | ICD-10-CM | POA: Diagnosis present

## 2017-05-14 DIAGNOSIS — I2510 Atherosclerotic heart disease of native coronary artery without angina pectoris: Secondary | ICD-10-CM | POA: Diagnosis present

## 2017-05-14 DIAGNOSIS — N390 Urinary tract infection, site not specified: Secondary | ICD-10-CM | POA: Diagnosis present

## 2017-05-14 DIAGNOSIS — N189 Chronic kidney disease, unspecified: Secondary | ICD-10-CM

## 2017-05-14 DIAGNOSIS — E440 Moderate protein-calorie malnutrition: Secondary | ICD-10-CM | POA: Diagnosis present

## 2017-05-14 DIAGNOSIS — R748 Abnormal levels of other serum enzymes: Secondary | ICD-10-CM

## 2017-05-14 DIAGNOSIS — Z515 Encounter for palliative care: Secondary | ICD-10-CM | POA: Diagnosis present

## 2017-05-14 DIAGNOSIS — C50412 Malignant neoplasm of upper-outer quadrant of left female breast: Secondary | ICD-10-CM | POA: Diagnosis present

## 2017-05-14 DIAGNOSIS — R531 Weakness: Secondary | ICD-10-CM

## 2017-05-14 DIAGNOSIS — C7989 Secondary malignant neoplasm of other specified sites: Secondary | ICD-10-CM | POA: Diagnosis not present

## 2017-05-14 DIAGNOSIS — J690 Pneumonitis due to inhalation of food and vomit: Secondary | ICD-10-CM | POA: Diagnosis present

## 2017-05-14 DIAGNOSIS — I10 Essential (primary) hypertension: Secondary | ICD-10-CM | POA: Diagnosis present

## 2017-05-14 DIAGNOSIS — R54 Age-related physical debility: Secondary | ICD-10-CM | POA: Diagnosis present

## 2017-05-14 DIAGNOSIS — T17908D Unspecified foreign body in respiratory tract, part unspecified causing other injury, subsequent encounter: Secondary | ICD-10-CM | POA: Diagnosis not present

## 2017-05-14 DIAGNOSIS — T17908A Unspecified foreign body in respiratory tract, part unspecified causing other injury, initial encounter: Secondary | ICD-10-CM | POA: Diagnosis not present

## 2017-05-14 DIAGNOSIS — N184 Chronic kidney disease, stage 4 (severe): Secondary | ICD-10-CM | POA: Diagnosis present

## 2017-05-14 DIAGNOSIS — I129 Hypertensive chronic kidney disease with stage 1 through stage 4 chronic kidney disease, or unspecified chronic kidney disease: Secondary | ICD-10-CM | POA: Diagnosis present

## 2017-05-14 DIAGNOSIS — A4189 Other specified sepsis: Principal | ICD-10-CM | POA: Diagnosis present

## 2017-05-14 DIAGNOSIS — C50919 Malignant neoplasm of unspecified site of unspecified female breast: Secondary | ICD-10-CM

## 2017-05-14 DIAGNOSIS — E860 Dehydration: Secondary | ICD-10-CM

## 2017-05-14 DIAGNOSIS — M1990 Unspecified osteoarthritis, unspecified site: Secondary | ICD-10-CM | POA: Diagnosis present

## 2017-05-14 DIAGNOSIS — C7972 Secondary malignant neoplasm of left adrenal gland: Secondary | ICD-10-CM | POA: Diagnosis present

## 2017-05-14 DIAGNOSIS — B379 Candidiasis, unspecified: Secondary | ICD-10-CM | POA: Diagnosis present

## 2017-05-14 DIAGNOSIS — Z6826 Body mass index (BMI) 26.0-26.9, adult: Secondary | ICD-10-CM

## 2017-05-14 DIAGNOSIS — Z9049 Acquired absence of other specified parts of digestive tract: Secondary | ICD-10-CM

## 2017-05-14 DIAGNOSIS — Z8719 Personal history of other diseases of the digestive system: Secondary | ICD-10-CM

## 2017-05-14 DIAGNOSIS — E785 Hyperlipidemia, unspecified: Secondary | ICD-10-CM | POA: Diagnosis present

## 2017-05-14 DIAGNOSIS — B370 Candidal stomatitis: Secondary | ICD-10-CM | POA: Diagnosis not present

## 2017-05-14 DIAGNOSIS — C50912 Malignant neoplasm of unspecified site of left female breast: Secondary | ICD-10-CM | POA: Diagnosis not present

## 2017-05-14 DIAGNOSIS — R778 Other specified abnormalities of plasma proteins: Secondary | ICD-10-CM | POA: Diagnosis present

## 2017-05-14 DIAGNOSIS — Z79899 Other long term (current) drug therapy: Secondary | ICD-10-CM

## 2017-05-14 DIAGNOSIS — D899 Disorder involving the immune mechanism, unspecified: Secondary | ICD-10-CM | POA: Diagnosis present

## 2017-05-14 DIAGNOSIS — Z8679 Personal history of other diseases of the circulatory system: Secondary | ICD-10-CM

## 2017-05-14 DIAGNOSIS — R000 Tachycardia, unspecified: Secondary | ICD-10-CM | POA: Diagnosis present

## 2017-05-14 DIAGNOSIS — F17210 Nicotine dependence, cigarettes, uncomplicated: Secondary | ICD-10-CM | POA: Diagnosis present

## 2017-05-14 DIAGNOSIS — Z90710 Acquired absence of both cervix and uterus: Secondary | ICD-10-CM

## 2017-05-14 DIAGNOSIS — I248 Other forms of acute ischemic heart disease: Secondary | ICD-10-CM | POA: Diagnosis present

## 2017-05-14 DIAGNOSIS — D696 Thrombocytopenia, unspecified: Secondary | ICD-10-CM | POA: Diagnosis present

## 2017-05-14 DIAGNOSIS — R1310 Dysphagia, unspecified: Secondary | ICD-10-CM | POA: Diagnosis present

## 2017-05-14 DIAGNOSIS — D631 Anemia in chronic kidney disease: Secondary | ICD-10-CM | POA: Diagnosis present

## 2017-05-14 DIAGNOSIS — A419 Sepsis, unspecified organism: Secondary | ICD-10-CM | POA: Diagnosis not present

## 2017-05-14 DIAGNOSIS — B9689 Other specified bacterial agents as the cause of diseases classified elsewhere: Secondary | ICD-10-CM | POA: Diagnosis present

## 2017-05-14 DIAGNOSIS — R6520 Severe sepsis without septic shock: Secondary | ICD-10-CM | POA: Diagnosis present

## 2017-05-14 DIAGNOSIS — E876 Hypokalemia: Secondary | ICD-10-CM | POA: Diagnosis present

## 2017-05-14 DIAGNOSIS — C7951 Secondary malignant neoplasm of bone: Secondary | ICD-10-CM | POA: Diagnosis present

## 2017-05-14 DIAGNOSIS — T17908S Unspecified foreign body in respiratory tract, part unspecified causing other injury, sequela: Secondary | ICD-10-CM | POA: Diagnosis not present

## 2017-05-14 DIAGNOSIS — R7989 Other specified abnormal findings of blood chemistry: Secondary | ICD-10-CM | POA: Diagnosis present

## 2017-05-14 HISTORY — DX: Malignant neoplasm of unspecified site of unspecified female breast: C50.919

## 2017-05-14 LAB — URINALYSIS, ROUTINE W REFLEX MICROSCOPIC
BILIRUBIN URINE: NEGATIVE
Glucose, UA: NEGATIVE mg/dL
Ketones, ur: 5 mg/dL — AB
Nitrite: NEGATIVE
Protein, ur: 30 mg/dL — AB
SPECIFIC GRAVITY, URINE: 1.014 (ref 1.005–1.030)
SQUAMOUS EPITHELIAL / LPF: NONE SEEN
pH: 5 (ref 5.0–8.0)

## 2017-05-14 LAB — CBC WITH DIFFERENTIAL/PLATELET
BASOS ABS: 0 10*3/uL (ref 0.0–0.1)
Basophils Relative: 0 %
EOS PCT: 1 %
Eosinophils Absolute: 0.1 10*3/uL (ref 0.0–0.7)
HCT: 38 % (ref 36.0–46.0)
Hemoglobin: 12.2 g/dL (ref 12.0–15.0)
LYMPHS ABS: 2 10*3/uL (ref 0.7–4.0)
LYMPHS PCT: 12 %
MCH: 30.1 pg (ref 26.0–34.0)
MCHC: 32.1 g/dL (ref 30.0–36.0)
MCV: 93.8 fL (ref 78.0–100.0)
MONO ABS: 0.6 10*3/uL (ref 0.1–1.0)
MONOS PCT: 3 %
Neutro Abs: 14.1 10*3/uL — ABNORMAL HIGH (ref 1.7–7.7)
Neutrophils Relative %: 84 %
PLATELETS: 255 10*3/uL (ref 150–400)
RBC: 4.05 MIL/uL (ref 3.87–5.11)
RDW: 14.2 % (ref 11.5–15.5)
WBC: 16.8 10*3/uL — ABNORMAL HIGH (ref 4.0–10.5)

## 2017-05-14 LAB — COMPREHENSIVE METABOLIC PANEL
ALT: 14 U/L (ref 14–54)
AST: 37 U/L (ref 15–41)
Albumin: 3.1 g/dL — ABNORMAL LOW (ref 3.5–5.0)
Alkaline Phosphatase: 146 U/L — ABNORMAL HIGH (ref 38–126)
Anion gap: 16 — ABNORMAL HIGH (ref 5–15)
BUN: 50 mg/dL — ABNORMAL HIGH (ref 6–20)
CHLORIDE: 108 mmol/L (ref 101–111)
CO2: 19 mmol/L — ABNORMAL LOW (ref 22–32)
Calcium: 12 mg/dL — ABNORMAL HIGH (ref 8.9–10.3)
Creatinine, Ser: 2.8 mg/dL — ABNORMAL HIGH (ref 0.44–1.00)
GFR, EST AFRICAN AMERICAN: 16 mL/min — AB (ref >60)
GFR, EST NON AFRICAN AMERICAN: 14 mL/min — AB (ref >60)
Glucose, Bld: 85 mg/dL (ref 65–99)
POTASSIUM: 3.7 mmol/L (ref 3.5–5.1)
Sodium: 143 mmol/L (ref 135–145)
TOTAL PROTEIN: 7.7 g/dL (ref 6.5–8.1)
Total Bilirubin: 1.2 mg/dL (ref 0.3–1.2)

## 2017-05-14 LAB — I-STAT CG4 LACTIC ACID, ED: Lactic Acid, Venous: 1.56 mmol/L (ref 0.5–1.9)

## 2017-05-14 LAB — TROPONIN I: TROPONIN I: 0.07 ng/mL — AB (ref <0.03)

## 2017-05-14 LAB — PROCALCITONIN: Procalcitonin: 0.4 ng/mL

## 2017-05-14 MED ORDER — ACETAMINOPHEN 325 MG PO TABS: 650.0000 mg | ORAL_TABLET | Freq: Four times a day (QID) | ORAL | Status: DC | PRN

## 2017-05-14 MED ORDER — HEPARIN SODIUM (PORCINE) 5000 UNIT/ML IJ SOLN
5000.0000 [IU] | Freq: Three times a day (TID) | INTRAMUSCULAR | Status: DC
  Administered 2017-05-14 – 2017-05-19 (×13): 5000 [IU] via SUBCUTANEOUS
  Filled 2017-05-14 (×15): qty 1

## 2017-05-14 MED ORDER — DEXTROSE 5 % IV SOLN
1.0000 g | INTRAVENOUS | Status: DC
  Administered 2017-05-15 – 2017-05-18 (×4): 1 g via INTRAVENOUS
  Filled 2017-05-14 (×4): qty 10

## 2017-05-14 MED ORDER — DEXTROSE 5 % IV SOLN
1.0000 g | Freq: Once | INTRAVENOUS | Status: AC
  Administered 2017-05-14: 1 g via INTRAVENOUS
  Filled 2017-05-14: qty 10

## 2017-05-14 MED ORDER — ALBUTEROL SULFATE (2.5 MG/3ML) 0.083% IN NEBU: 2.5000 mg | INHALATION_SOLUTION | RESPIRATORY_TRACT | Status: DC | PRN

## 2017-05-14 MED ORDER — ONDANSETRON HCL 4 MG PO TABS: 4.0000 mg | ORAL_TABLET | Freq: Four times a day (QID) | ORAL | Status: DC | PRN

## 2017-05-14 MED ORDER — SODIUM CHLORIDE 0.9 % IV SOLN
INTRAVENOUS | Status: DC
  Administered 2017-05-14 – 2017-05-20 (×9): via INTRAVENOUS

## 2017-05-14 MED ORDER — ONDANSETRON HCL 4 MG/2ML IJ SOLN: 4.0000 mg | Freq: Four times a day (QID) | INTRAMUSCULAR | Status: DC | PRN

## 2017-05-14 MED ORDER — OXYCODONE HCL 5 MG PO TABS: 5.0000 mg | ORAL_TABLET | ORAL | Status: DC | PRN

## 2017-05-14 MED ORDER — LOSARTAN POTASSIUM 50 MG PO TABS
100.0000 mg | ORAL_TABLET | Freq: Every day | ORAL | Status: DC
  Administered 2017-05-15 – 2017-05-18 (×4): 100 mg via ORAL
  Filled 2017-05-14 (×4): qty 2

## 2017-05-14 MED ORDER — VITAMIN B-12 1000 MCG PO TABS
1000.0000 ug | ORAL_TABLET | Freq: Every day | ORAL | Status: DC
  Administered 2017-05-15 – 2017-05-18 (×4): 1000 ug via ORAL
  Filled 2017-05-14 (×4): qty 1

## 2017-05-14 MED ORDER — ROSUVASTATIN CALCIUM 10 MG PO TABS
10.0000 mg | ORAL_TABLET | Freq: Every day | ORAL | Status: DC
  Administered 2017-05-14 – 2017-05-18 (×5): 10 mg via ORAL
  Filled 2017-05-14 (×6): qty 1

## 2017-05-14 MED ORDER — ACETAMINOPHEN 650 MG RE SUPP: 650.0000 mg | Freq: Four times a day (QID) | RECTAL | Status: DC | PRN

## 2017-05-14 MED ORDER — LACTATED RINGERS IV BOLUS (SEPSIS)
1000.0000 mL | Freq: Once | INTRAVENOUS | Status: AC
  Administered 2017-05-14: 1000 mL via INTRAVENOUS

## 2017-05-14 MED ORDER — POLYETHYLENE GLYCOL 3350 17 G PO PACK
17.0000 g | PACK | ORAL | Status: DC
  Administered 2017-05-17: 17 g via ORAL
  Filled 2017-05-14 (×3): qty 1

## 2017-05-14 NOTE — H&P (Signed)
History and Physical    Samantha Abbott FKC:[id number] DOB: [birth year] DOA: 05/14/2017  Referring MD/NP/PA: Dr. Kathrynn Humble PCP: Benito Mccreedy, MD  Patient coming from: home  Chief Complaint:  Weakness  HPI: Samantha Abbott is a 83 y.o. female with medical history significant of recently diagnosed invasive ductalbreast cancer on 03/2017, dysrhythmia, HTN, and GERD; who presents with generalized weakness over Samantha last 4-5 days. Patient's Abbott provides history as patient is unable to at this time. He notes that she has rapidly deteriorated since onset of symptoms. She is not able to walk  and will not eat or drink anything. Associated symptoms include lethargy, mild confusion, nausea, and mild nonproductive cough. Patient has not been having any fever, chills, chest pain, abdominal pain, vomiting, or diarrhea. Family is in significant need of obtaining a hospital bed to help care for her at home. She is followed by Dr. Gwenlyn Perking of oncology, and recently decided to go through with Samantha surgery to have Samantha cancer removed.  An appointment with Dr. Rush Farmer has been set for this following Tuesday.  ED Course: Upon admission to [facility] emergency department patient was seen to be afebrile, pulse 80's, respirations 18-19, and all other vitals maintained. Labs revealed WBC 16.8, CO2 19, BUN 50, creatinine 2.8, calcium 12, troponin 0.07. Chest x-ray shows a 12 mm left lower lobe nodule consistent with metastatic disease seen on previous CT scan. Samantha patient's urinalysis was positive for signs of infection. Patient was started on Rocephin IV in Samantha ED and sepsis protocol was initiated given patient's immunocompromised status. While in [facility] emergency department she also received at least 1 L IV fluids.  Review of Systems: As per HPI otherwise 10 point review of systems negative.   Past Medical History:  Diagnosis Date  . Abdominal pain   . Abnormal heart rhythm   . Arthritis   . Breast cancer ([place])    BILATERAL  . Dysrhythmia   . Esophageal ulcer   . Flatulence, eructation, and gas pain   . GERD (gastroesophageal reflux disease)    with esophageal ulcer in past  . Hypertension    LOV WITH EKG DR [facility] RECEIVED 12/02/11 1530 and placed on chart  . Iron deficiency anemia   . Myocardial infarction [facility])    silent MI 1961/ states anterior wall  . Nausea   . Sinus problem   . Vomiting   . Wears glasses     Past Surgical History:  Procedure Laterality Date  . ABDOMINAL HYSTERECTOMY    . BOWEL RESECTION  03/24/2012   Procedure: SMALL BOWEL RESECTION;  Surgeon: Pedro Earls, MD;  Location: [facility];  Service: General;  Laterality: N/A;  . BREAST EXCISIONAL BIOPSY Left   . BREAST EXCISIONAL BIOPSY Right   . CHOLECYSTECTOMY    . COLON SURGERY  12/06/11   lap sigmoid colectomy   . COLONOSCOPY    . FOOT SURGERY       reports that she has been smoking Cigarettes.  She has a 25.00 pack-year smoking history. She has never used smokeless tobacco. She reports that she does not drink alcohol or use drugs.  No Known Allergies  Family History  Problem Relation Age of Onset  . Cancer Mother        breast cancer  . Breast cancer Mother 51  . Heart disease Father        heart attack     Prior to Admission medications   Medication Sig Start Date End Date Taking?  Authorizing Provider  losartan (COZAAR) 100 MG tablet Take 100 mg by mouth daily.   Yes [provider]  ondansetron (ZOFRAN) 8 MG tablet Take 1 tablet (8 mg total) by mouth every 8 (eight) hours as needed for nausea or vomiting. 04/24/17  Yes Hayden Pedro, PA-C  oxyCODONE (OXY IR/ROXICODONE) 5 MG immediate release tablet Take 1 tablet (5 mg total) by mouth every 4 (four) hours as needed for severe pain. 04/24/17  Yes Hayden Pedro, PA-C  rosuvastatin (CRESTOR) 10 MG tablet Take 10 mg by mouth daily.   Yes [provider]  vitamin B-12 (CYANOCOBALAMIN) 1000 MCG tablet Take 1,000 mcg by mouth  daily.   Yes [provider]  Vitamin D, Cholecalciferol, 1000 units CAPS Take 1 capsule by mouth daily.   Yes [provider]  anastrozole (ARIMIDEX) 1 MG tablet Take 1 tablet (1 mg total) by mouth daily. Patient not taking: Reported on 05/14/2017 04/23/17   Magrinat, Virgie Dad, MD  hydrochlorothiazide (HYDRODIURIL) 12.5 MG tablet Take 2 tablets (25 mg total) by mouth daily. 03/31/12 03/31/13  Johnathan Hausen, MD  polyethylene glycol [facility] / Floria Raveling) packet Take 17 g by mouth every other day.    [provider]    Physical Exam:   Constitutional: Elderly female who appears lethargic but will respond to verbal commands. Vitals:   05/14/17 1617 05/14/17 1626 05/14/17 1632 05/14/17 1948  BP: (!) 124/94   (!) 161/76  Pulse: 80   (!) 118  Resp: 19   18  Temp: 98.2 F (36.8 C)   98 F (36.7 C)  TempSrc: Oral   Oral  SpO2: 100% 97%  100%  Weight:   65.8 kg (145 lb)   Height:   5\' 1"  (1.549 m)    Eyes: PERRL, lids and conjunctivae normal ENMT: Mucous membranes are dry. Posterior pharynx clear of any exudate or lesions.  Neck: normal, supple, no masses, no thyromegaly Respiratory: clear to auscultation bilaterally, no wheezing, no crackles. Normal respiratory effort. No accessory muscle use.  Cardiovascular: Irregular, regular. no murmurs / rubs / gallops. No extremity edema. 2+ pedal pulses. No carotid bruits.  Abdomen: no tenderness, no masses palpated. No hepatosplenomegaly. Bowel sounds positive.  Musculoskeletal: no clubbing / cyanosis. No joint deformity upper and lower extremities. Good ROM, no contractures. Normal muscle tone.  Skin: no rashes, lesions, ulcers. No induration. Poor skin turgor  Neurologic: CN 2-12 grossly intact. Sensation intact, DTR normal. Strength 4/5 in all 4.  Psychiatric: Lethargic and oriented x 1. Normal mood.     Labs on Admission: I have personally reviewed following labs and imaging studies  CBC:  Recent Labs Lab  05/14/17 1755  WBC 16.8*  NEUTROABS 14.1*  HGB 12.2  HCT 38.0  MCV 93.8  PLT 478   Basic Metabolic Panel:  Recent Labs Lab 05/14/17 1755  NA 143  K 3.7  CL 108  CO2 19*  GLUCOSE 85  BUN 50*  CREATININE 2.80*  CALCIUM 12.0*   GFR: Estimated Creatinine Clearance: 11.8 mL/min (A) (by C-G formula based on SCr of 2.8 mg/dL (H)). Liver Function Tests:  Recent Labs Lab 05/14/17 1755  AST 37  ALT 14  ALKPHOS 146*  BILITOT 1.2  PROT 7.7  ALBUMIN 3.1*   No results for input(s): LIPASE, AMYLASE in Samantha last 168 hours. No results for input(s): AMMONIA in Samantha last 168 hours. Coagulation Profile: No results for input(s): INR, PROTIME in Samantha last 168 hours. Cardiac Enzymes:  Recent Labs  Lab 05/14/17 1755  TROPONINI 0.07*   BNP (last 3 results) No results for input(s): PROBNP in Samantha last 8760 hours. HbA1C: No results for input(s): HGBA1C in Samantha last 72 hours. CBG: No results for input(s): GLUCAP in Samantha last 168 hours. Lipid Profile: No results for input(s): CHOL, HDL, LDLCALC, TRIG, CHOLHDL, LDLDIRECT in Samantha last 72 hours. Thyroid Function Tests: No results for input(s): TSH, T4TOTAL, FREET4, T3FREE, THYROIDAB in Samantha last 72 hours. Anemia Panel: No results for input(s): VITAMINB12, FOLATE, FERRITIN, TIBC, IRON, RETICCTPCT in Samantha last 72 hours. Urine analysis:    Component Value Date/Time   COLORURINE YELLOW 05/14/2017 1641   APPEARANCEUR HAZY (A) 05/14/2017 1641   LABSPEC 1.014 05/14/2017 1641   PHURINE 5.0 05/14/2017 1641   GLUCOSEU NEGATIVE 05/14/2017 1641   HGBUR MODERATE (A) 05/14/2017 1641   BILIRUBINUR NEGATIVE 05/14/2017 1641   KETONESUR 5 (A) 05/14/2017 1641   PROTEINUR 30 (A) 05/14/2017 1641   UROBILINOGEN 1 04/16/2012 1205   NITRITE NEGATIVE 05/14/2017 1641   LEUKOCYTESUR SMALL (A) 05/14/2017 1641   Sepsis Labs: No results found for this or any previous visit (from Samantha past 240 hour(s)).   Radiological Exams on Admission: Dg Chest Port 1  View  Result Date: 05/14/2017 CLINICAL DATA:  WEAKNESS SINCE Friday. PER EMS, Samantha PT HAS A HIS OF BILATERAL BREAST CA, BUT NOT ON ANY TREATMENT YET. PER Samantha Abbott, Samantha PT HAS ELECTED TO HAVE SURGERY, BUT THEY ARE WAITING FOR Samantha ORDERS. Samantha PT HAS NOT BEEN EATING, DRINKING, AND INCONTINENT OF URINE AND STOOL. PT AAOX4, BUT SLOW TO RESPOND. PT STS, "ALL OF MY ENERGY IS GONE." EXAM: PORTABLE CHEST - 1 VIEW COMPARISON:  CT 05/06/2017 and previous FINDINGS: 12 mm nodule, left lower lobe. Right lung clear. Atheromatous aorta. Mild cardiomegaly. No effusion.  No pneumothorax. Anterior vertebral endplate spurring at multiple levels in Samantha mid and lower thoracic spine. Mild degenerative change in Samantha right shoulder. IMPRESSION: 1. No acute findings. 2. 12 mm left lower lung pulmonary nodule, new since 11/29/2011, likely corresponding to Samantha metastatic disease seen on recent CT chest. Electronically Signed   By: Lucrezia Europe M.D.   On: 05/14/2017 17:13    EKG: Independently reviewed. Sinus rhythm with LVH  Assessment/Plan Possible sepsis 2/2 UTI: Acute. Patient presents with generalized weakness found to have WBC 16.8 and UA positive for signs of infection. Heart rates were never actually elevated. Due to her immunocompromise status sepsis protocol was initiated. Lactic acid pending.  Patient was started on ceftriaxone and ED. - Admit to a med-surg  - Sepsis protocol initiated - Follow-up blood and urine cultures - Continue Rocephin per pharmacy  Acute kidney injury on chronic kidney disease stage IV 2/2 dehydration: Patient's baseline creatinine previously had been 2.5, but she presents with a creatinine of 2.8 and a BUN of 50. - IV fluids NS at 75 ml/hr - strict I&O's - repeat BMP in a.m.  Elevated troponin: Acute. Patient denies having active chest pain. Troponin seen to be elevated at 0.7. - Repeat troponin - May warrant further investigation such as echocardiogram and/or cardiology  Hypercalcemia:  Patient initial potassium was elevated up to 12. This could be a cause for mild confusion likely related with malignancy. - IV fluids as seen above  Invasive ductal carcinoma: Followed by Dr. Jana Hakim of oncology. - Notify patient's oncologist in a.m. that [facility]antha patient is in [facility] hospital   Essential hypertension - Continue losartan   Hyperlipidemia  - Continue Crestor DVT prophylaxis:  Heparin  Code Status: DO NOT INTUBATE/DO NOT RESUSCITATE   Family Communication:  discuss plan of care with patient family present at bedside  Disposition Plan: Likely discharge home once medically stable Consults called: none  Admission status: Inpatient  Norval Morton MD [facility] Pager [phone]  If 7PM-7AM, please contact night-coverage [email or web address] Password [facility]  05/14/2017, 8:03 PM

## 2017-05-14 NOTE — ED Triage Notes (Signed)
PT RECEIVED FROM HOME VIA EMS FOR INCREASED WEAKNESS SINCE Friday. PER EMS, THE PT HAS A HIS OF BILATERAL BREAST CA, BUT NOT ON ANY TREATMENT YET. PER THE SON, THE PT HAS ELECTED TO HAVE SURGERY, BUT THEY ARE WAITING FOR THE ORDERS. THE PT HAS NOT BEEN EATING, DRINKING, AND INCONTINENT OF URINE AND STOOL. PT AAOX4, BUT SLOW TO RESPOND. PT STS, "ALL OF MY ENERGY IS GONE."

## 2017-05-14 NOTE — ED Notes (Addendum)
Critical troponin of 0.07, primary RN and EDP notified

## 2017-05-14 NOTE — Progress Notes (Signed)
PHARMACY NOTE -  Rocephin  Pharmacy has been assisting with dosing of rocephin for UTI. Dosage remains stable at 1g IV q24 hr and need for further dosage adjustment appears unlikely at present.    Will sign off at this time.  Please reconsult if a change in clinical status warrants re-evaluation of dosage.  Reuel Boom, PharmD, BCPS Pager: [phone] 05/14/2017, 9:22 PM

## 2017-05-14 NOTE — ED Provider Notes (Signed)
Samantha Abbott DEPT Provider Note   CSN: [id number] Arrival date & time: 05/14/17  1547     History   Chief Complaint Chief Complaint  Patient presents with  . Weakness    HPI Samantha Abbott is a 83 y.o. female.  HPI   Pt comes in with cc of weakness. LEVEL 5 CAVEAT FOR CONFUSION Pt has hx of CAD, Bilateral breast CA and HTN. Pt is not actively getting chemo or radiation. Pt's son reports that pt has been having weakness since Friday. Pt is not able to walk anymore. Pt has not been eating or drinking since Friday either. Pt denies headache, chest pain, dib, abd pain. She is having cough.   Past Medical History:  Diagnosis Date  . Abdominal pain   . Abnormal heart rhythm   . Arthritis   . Breast cancer ([place])    BILATERAL  . Dysrhythmia   . Esophageal ulcer   . Flatulence, eructation, and gas pain   . GERD (gastroesophageal reflux disease)    with esophageal ulcer in past  . Hypertension    LOV WITH EKG DR [facility] RECEIVED 12/02/11 1530 and placed on chart  . Iron deficiency anemia   . Myocardial infarction [facility])    silent MI 1961/ states anterior wall  . Nausea   . Sinus problem   . Vomiting   . Wears glasses     Patient Active Problem List   Diagnosis Date Noted  . Sepsis secondary to UTI ([place]) 05/14/2017  . Carcinoma of upper-outer quadrant of left breast in female, estrogen receptor positive ([place]) 04/21/2017  . H/O colostomy-Closed 2013 06/04/2012  . 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 GERD (gastroesophageal reflux disease) 10/31/2011  . Irregular heartbeat 10/31/2011    Past Surgical History:  Procedure Laterality Date  . ABDOMINAL HYSTERECTOMY    . BOWEL RESECTION  03/24/2012   Procedure: SMALL BOWEL RESECTION;  Surgeon: Pedro Earls, MD;  Location: [facility];  Service: General;  Laterality: N/A;  . BREAST EXCISIONAL BIOPSY Left   . BREAST EXCISIONAL BIOPSY Right   . CHOLECYSTECTOMY      . COLON SURGERY  12/06/11   lap sigmoid colectomy   . COLONOSCOPY    . FOOT SURGERY      OB History    No data available       Home Medications    Prior to Admission medications   Medication Sig Start Date End Date Taking? Authorizing Provider  losartan (COZAAR) 100 MG tablet Take 100 mg by mouth daily.   Yes [provider]  ondansetron (ZOFRAN) 8 MG tablet Take 1 tablet (8 mg total) by mouth every 8 (eight) hours as needed for nausea or vomiting. 04/24/17  Yes Hayden Pedro, PA-C  oxyCODONE (OXY IR/ROXICODONE) 5 MG immediate release tablet Take 1 tablet (5 mg total) by mouth every 4 (four) hours as needed for severe pain. 04/24/17  Yes Hayden Pedro, PA-C  rosuvastatin (CRESTOR) 10 MG tablet Take 10 mg by mouth daily.   Yes [provider]  vitamin B-12 (CYANOCOBALAMIN) 1000 MCG tablet Take 1,000 mcg by mouth daily.   Yes [provider]  Vitamin D, Cholecalciferol, 1000 units CAPS Take 1 capsule by mouth daily.   Yes [provider]  anastrozole (ARIMIDEX) 1 MG tablet Take 1 tablet (1 mg total) by mouth daily. Patient not taking: Reported on 05/14/2017 04/23/17   Magrinat, Virgie Dad, MD  hydrochlorothiazide (HYDRODIURIL) 12.5 MG tablet  Take 2 tablets (25 mg total) by mouth daily. 03/31/12 03/31/13  Johnathan Hausen, MD  polyethylene glycol [facility] / Floria Raveling) packet Take 17 g by mouth every other day.    [provider]    Family History Family History  Problem Relation Age of Onset  . Cancer Mother        breast cancer  . Breast cancer Mother 39  . Heart disease Father        heart attack     Social History Social History  Substance Use Topics  . Smoking status: Current Some Day Smoker    Packs/day: 0.50    Years: 50.00    Types: Cigarettes  . Smokeless tobacco: Never Used  . Alcohol use No     Allergies   Patient has no known allergies.   Review of Systems Review of Systems  Unable to perform ROS: Mental  status change     Physical Exam Updated Vital Signs BP (!) 159/74 (BP Location: Right Arm)   Pulse 71   Temp 98.6 F (37 C) (Oral)   Resp 20   Ht 5\' 1"  (1.549 m)   Wt 63.3 kg (139 lb 9.6 oz)   SpO2 99%   BMI 26.38 kg/m   Physical Exam  Constitutional: She appears well-developed.  HENT:  Head: Normocephalic and atraumatic.  Eyes: EOM are normal.  Neck: Normal range of motion. Neck supple.  Cardiovascular: Normal rate.   Pulmonary/Chest: Effort normal.  Abdominal: Bowel sounds are normal.  Neurological:  Somnolent and confused  Skin: Skin is warm and dry.  Nursing note and vitals reviewed.    ED Treatments / Results  Labs (all labs ordered are listed, but only abnormal results are displayed) Labs Reviewed  CBC WITH DIFFERENTIAL/PLATELET - Abnormal; Notable for the following:       Result Value   WBC 16.8 (*)    Neutro Abs 14.1 (*)    All other components within normal limits  COMPREHENSIVE METABOLIC PANEL - Abnormal; Notable for the following:    CO2 19 (*)    BUN 50 (*)    Creatinine, Ser 2.80 (*)    Calcium 12.0 (*)    Albumin 3.1 (*)    Alkaline Phosphatase 146 (*)    GFR calc non Af Amer 14 (*)    GFR calc Af Amer 16 (*)    Anion gap 16 (*)    All other components within normal limits  TROPONIN I - Abnormal; Notable for the following:    Troponin I 0.07 (*)    All other components within normal limits  URINALYSIS, ROUTINE W REFLEX MICROSCOPIC - Abnormal; Notable for the following:    APPearance HAZY (*)    Hgb urine dipstick MODERATE (*)    Ketones, ur 5 (*)    Protein, ur 30 (*)    Leukocytes, UA SMALL (*)    Bacteria, UA MANY (*)    All other components within normal limits  URINE CULTURE  CULTURE, BLOOD (ROUTINE X 2)  CULTURE, BLOOD (ROUTINE X 2)  PROCALCITONIN  CBC  BASIC METABOLIC PANEL  I-STAT CG4 LACTIC ACID, ED  I-STAT CG4 LACTIC ACID, ED  I-STAT CG4 LACTIC ACID, ED    EKG  EKG Interpretation  Date/Time:  Wednesday May 14 2017  16:20:06 EDT Ventricular Rate:  85 PR Interval:    QRS Duration: 111 QT Interval:  369 QTC Calculation: 439 R Axis:   -24 Text Interpretation:  Sinus rhythm LVH with IVCD and  secondary repol abnrm Since last EKG, diffuse TWI in anterolateral leads with repolarization abnormalities, possibly due to progression of LVH No ST elevations Confirmed by Ellender Hose MD, Lysbeth Galas [phone]) on 05/14/2017 6:06:48 PM       Radiology Ct Head Wo Contrast  Result Date: 05/14/2017 CLINICAL DATA:  Increasing weakness for several days. Altered mental status. Recent diagnosis of bilateral breast cancer. History of hypertension. EXAM: CT HEAD WITHOUT CONTRAST TECHNIQUE: Contiguous axial images were obtained from the base of the skull through the vertex without intravenous contrast. COMPARISON:  05/06/2017 FINDINGS: Brain: Diffuse cerebral atrophy. Ventricular dilatation consistent with central atrophy. Low-attenuation changes in the deep white matter consistent with small vessel ischemia. No mass effect or midline shift. No abnormal extra-axial fluid collections. Gray-white matter junctions are distinct. Basal cisterns are not effaced. No acute intracranial hemorrhage. Vascular: Vascular calcifications are present. Skull: Circumscribed lucent lesions in the calvarium consistent with known metastatic disease. No depressed skull fractures. Sinuses/Orbits: Paranasal sinuses and mastoid air cells are clear. Other: No significant changes since previous study. IMPRESSION: No acute intracranial abnormalities. Chronic atrophy and small vessel ischemic changes. Skull metastases again demonstrated. Electronically Signed   By: Lucienne Capers M.D.   On: 05/14/2017 23:45   Dg Chest Port 1 View  Result Date: 05/14/2017 CLINICAL DATA:  Arneta Cliche SINCE Friday. PER EMS, THE PT HAS A HIS OF BILATERAL BREAST CA, BUT NOT ON ANY TREATMENT YET. PER THE SON, THE PT HAS ELECTED TO HAVE SURGERY, BUT THEY ARE WAITING FOR THE ORDERS. THE PT HAS NOT  BEEN EATING, DRINKING, AND INCONTINENT OF URINE AND STOOL. PT AAOX4, BUT SLOW TO RESPOND. PT STS, "ALL OF MY ENERGY IS GONE." EXAM: PORTABLE CHEST - 1 VIEW COMPARISON:  CT 05/06/2017 and previous FINDINGS: 12 mm nodule, left lower lobe. Right lung clear. Atheromatous aorta. Mild cardiomegaly. No effusion.  No pneumothorax. Anterior vertebral endplate spurring at multiple levels in the mid and lower thoracic spine. Mild degenerative change in the right shoulder. IMPRESSION: 1. No acute findings. 2. 12 mm left lower lung pulmonary nodule, new since 11/29/2011, likely corresponding to the metastatic disease seen on recent CT chest. Electronically Signed   By: Lucrezia Europe M.D.   On: 05/14/2017 17:13    Procedures Procedures (including critical care time) CRITICAL CARE Performed by: Varney Biles   Total critical care time: 43 minutes for severe sepsis.  Critical care time was exclusive of separately billable procedures and treating other patients.  Critical care was necessary to treat or prevent imminent or life-threatening deterioration.  Critical care was time spent personally by me on the following activities: development of treatment plan with patient and/or surrogate as well as nursing, discussions with consultants, evaluation of patient's response to treatment, examination of patient, obtaining history from patient or surrogate, ordering and performing treatments and interventions, ordering and review of laboratory studies, ordering and review of radiographic studies, pulse oximetry and re-evaluation of patient's condition.    Medications Ordered in ED Medications  vitamin B-12 (CYANOCOBALAMIN) tablet 1,000 mcg (not administered)  oxyCODONE (Oxy IR/ROXICODONE) immediate release tablet 5 mg (not administered)  polyethylene glycol (MIRALAX / GLYCOLAX) packet 17 g (not administered)  losartan (COZAAR) tablet 100 mg (not administered)  rosuvastatin (CRESTOR) tablet 10 mg (10 mg Oral Given  05/14/17 2226)  heparin injection 5,000 Units (5,000 Units Subcutaneous Given 05/14/17 2226)  0.9 %  sodium chloride infusion ( Intravenous New Bag/Given 05/14/17 2226)  acetaminophen (TYLENOL) tablet 650 mg (not administered)    Or  acetaminophen (  TYLENOL) suppository 650 mg (not administered)  ondansetron (ZOFRAN) tablet 4 mg (not administered)    Or  ondansetron (ZOFRAN) injection 4 mg (not administered)  albuterol (PROVENTIL) (2.5 MG/3ML) 0.083% nebulizer solution 2.5 mg (not administered)  cefTRIAXone (ROCEPHIN) 1 g in dextrose 5 % 50 mL IVPB (not administered)  lactated ringers bolus 1,000 mL (0 mLs Intravenous Stopped 05/14/17 1900)  cefTRIAXone (ROCEPHIN) 1 g in dextrose 5 % 50 mL IVPB (0 g Intravenous Stopped 05/14/17 2136)     Initial Impression / Assessment and Plan / ED Course  I have reviewed the triage vital signs and the nursing notes.  Pertinent labs & imaging results that were available during my care of the patient were reviewed by me and considered in my medical decision making (see chart for details).  Clinical Course as of May 15 129  Wed May 14, 2017  2009 Pt now has 2 SIRS, as her last HR was 118. We will get lactic acid and blood cultures. Given the 2nd SIRS just went up, pt is already s/p iv ceftriaxone. Also, pt is a very difficult iv stick, so if blood cultures are difficult to obtain, we might have to abort the cultures.  [AN]    Clinical Course User Index [AN] Varney Biles, MD    Pt comes in with cc of weakness.  DDx: Sepsis syndrome ACS syndrome DKA ICH / Stroke / metastatic disease to brain Infection - pneumonia/UTI/Cellulitis PE Dehydration Electrolyte abnormality Tox syndrome  We will get basic labs. Ct head to r/o bleed or brain mets due to mental status changes. Pt has no clear source of infection on history and exam besides cough.  Final Clinical Impressions(s) / ED Diagnoses   Final diagnoses:  Dehydration  Generalized weakness    Acute renal failure superimposed on chronic kidney disease, unspecified CKD stage, unspecified acute renal failure type ([place])  Lower urinary tract infectious disease    New Prescriptions Current Discharge Medication List       Varney Biles, MD 05/15/17 0130

## 2017-05-14 NOTE — ED Notes (Signed)
I attempted twice to collect labs and was unsuccessful 

## 2017-05-14 NOTE — ED Notes (Addendum)
WILL TRANSPORT PT TO 3W 1344-1. AAOX4. PT IN NO APPARENT DISTRESS OR PAIN. IVF INFUSING W/O PAIN OR SWELLING. THE OPPORTUNITY TO ASK QUESTIONS WAS PROVIDED.

## 2017-05-14 NOTE — ED Notes (Signed)
ER PHYSICIAN AS WELL AS ADMITTING PHYSICIAN MADE AWARE OF THE DIFFICULTY IN OBTAINING BLOOD. THEY ARE ALSO AWARE OF 1 BLOOD CULTURE BEING OBTAINED.

## 2017-05-14 NOTE — ED Notes (Signed)
Bed: WA08 Expected date:  Expected time:  Means of arrival:  Comments: 83 yo weak

## 2017-05-15 DIAGNOSIS — N179 Acute kidney failure, unspecified: Secondary | ICD-10-CM | POA: Diagnosis present

## 2017-05-15 DIAGNOSIS — R7989 Other specified abnormal findings of blood chemistry: Secondary | ICD-10-CM | POA: Diagnosis present

## 2017-05-15 DIAGNOSIS — N189 Chronic kidney disease, unspecified: Secondary | ICD-10-CM

## 2017-05-15 DIAGNOSIS — R778 Other specified abnormalities of plasma proteins: Secondary | ICD-10-CM | POA: Diagnosis present

## 2017-05-15 DIAGNOSIS — I10 Essential (primary) hypertension: Secondary | ICD-10-CM | POA: Diagnosis present

## 2017-05-15 LAB — BASIC METABOLIC PANEL
ANION GAP: 14 (ref 5–15)
BUN: 50 mg/dL — AB (ref 6–20)
CHLORIDE: 112 mmol/L — AB (ref 101–111)
CO2: 19 mmol/L — ABNORMAL LOW (ref 22–32)
Calcium: 12 mg/dL — ABNORMAL HIGH (ref 8.9–10.3)
Creatinine, Ser: 2.61 mg/dL — ABNORMAL HIGH (ref 0.44–1.00)
GFR calc Af Amer: 18 mL/min — ABNORMAL LOW (ref >60)
GFR, EST NON AFRICAN AMERICAN: 15 mL/min — AB (ref >60)
GLUCOSE: 79 mg/dL (ref 65–99)
POTASSIUM: 3.8 mmol/L (ref 3.5–5.1)
Sodium: 145 mmol/L (ref 135–145)

## 2017-05-15 LAB — CBC
HEMATOCRIT: 34.2 % — AB (ref 36.0–46.0)
HEMOGLOBIN: 10.8 g/dL — AB (ref 12.0–15.0)
MCH: 29.5 pg (ref 26.0–34.0)
MCHC: 31.6 g/dL (ref 30.0–36.0)
MCV: 93.4 fL (ref 78.0–100.0)
Platelets: 252 10*3/uL (ref 150–400)
RBC: 3.66 MIL/uL — AB (ref 3.87–5.11)
RDW: 14.4 % (ref 11.5–15.5)
WBC: 15.8 10*3/uL — AB (ref 4.0–10.5)

## 2017-05-15 LAB — TROPONIN I: Troponin I: 0.08 ng/mL (ref <0.03)

## 2017-05-15 MED ORDER — LIP MEDEX EX OINT
TOPICAL_OINTMENT | CUTANEOUS | Status: AC
  Administered 2017-05-15: 18:00:00
  Filled 2017-05-15: qty 7

## 2017-05-15 MED ORDER — ENSURE ENLIVE PO LIQD
237.0000 mL | Freq: Three times a day (TID) | ORAL | Status: DC
Start: 2017-05-15 — End: 2017-05-23
  Administered 2017-05-15 – 2017-05-18 (×7): 237 mL via ORAL

## 2017-05-15 NOTE — Progress Notes (Signed)
Initial Nutrition Assessment  DOCUMENTATION CODES:   Non-severe (moderate) malnutrition in context of acute illness/injury  INTERVENTION:   Provide Ensure Enlive po TID, each supplement provides 350 kcal and 20 grams of protein RD will continue to monitor -GOC  NUTRITION DIAGNOSIS:   Malnutrition related to acute illness, lethargy/confusion as evidenced by energy intake < 75% for > 7 days, mild depletion of body fat, mild depletion of muscle mass.  GOAL:   Patient will meet greater than or equal to 90% of their needs  MONITOR:   PO intake, Supplement acceptance, Labs, Weight trends, I & O's  REASON FOR ASSESSMENT:   Malnutrition Screening Tool    ASSESSMENT:   83 year old female with past medical history of invasive ductal breast cancer (diagnosed 03/2017), hypertension, GERD who presented to ED with worsening weakness over past 4-5 days PTA> Pt is not able to walk and her appetite has significantly decreased.   Patient in room with son at bedside. Pt lethargic and unable to provide history. Pt's son states that the patient has been refusing to eat solid food. Meal tray sitting untouched in room at this time. Pt's son states she does well to drink Boost or Ensure drinks. RD to order Ensure TID. States pt gets nauseous with solid food and has "no taste".  Per chart review, pt's weight stable. Pt's son states her weight has been stable between 139-150 lb.  Nutrition-Focused physical exam completed. Findings are mild fat depletion, mild muscle depletion, and no edema.   Medications: Vitamin B-12 tablet daily Labs reviewed: GFR: 18  Diet Order:  Diet Heart Room service appropriate? Yes; Fluid consistency: Thin  Skin:  Reviewed, no issues  Last BM:  PTA  Height:   Ht Readings from Last 1 Encounters:  05/14/17 5\' 1"  (1.549 m)    Weight:   Wt Readings from Last 1 Encounters:  05/14/17 139 lb 9.6 oz (63.3 kg)    Ideal Body Weight:  47.7 kg  BMI:  Body mass index  is 26.38 kg/m.  Estimated Nutritional Needs:   Kcal:  1500-1700  Protein:  65-75g  Fluid:  1.7L/day  EDUCATION NEEDS:   No education needs identified at this time  Clayton Bibles, MS, RD, LDN Pager: [phone] After Hours Pager: [phone]

## 2017-05-15 NOTE — Progress Notes (Signed)
Patient ID: Samantha Abbott, female   DOB: 1924-12-30, 83 y.o.   MRN: [id number]  PROGRESS NOTE    [facility]  [facility]:[id number] DOB: [birth year] DOA: 05/14/2017  PCP: Benito Mccreedy, MD   Brief Narrative:  83 year old female with past medical history of invasive ductal breast cancer (diagnosed 03/2017), hypertension, GERD who presented to ED with worsening weakness over past 4-5 days PTA> Pt is not able to walk and her appetite has significantly decreased.    Assessment & Plan:   Principal Problem:   Sepsis secondary to UTI ([place]) / Leukocytosis  - Sepsis criteria met on admission with tachycardia, tachypnea, leukocytosis and source of infection UTI based on UA - Started on rocephin - Blood and urine cx results are pending   Active Problems:   Carcinoma of upper-outer quadrant of left breast in female, estrogen receptor positive (Samantha Abbott) - Appreciate PCT for GOC    Elevated troponin - Likely demand ischemia from sepsis and AKI - Palliative care consulted for goals of care     Hypercalcemia - Likely due to dehydration versus malignancy - Continue IV fluids - Follow up BMP in am    Dyslipidemia - Continue Crestor     Essential hypertension - Continue cozaar    Acute kidney injury superimposed on chronic kidney disease (HCC) - Baseline creatinine is 2.5 - Cr on this admission 2.8, likely elevated due to sepsis - Follow up BMP in am - Continue IV fluids    DVT prophylaxis: Heparin subQ Code Status: DNR/DNI Family Communication: son at the bedside this am Disposition Plan: not yet stable for discharge, awaiting urine cx results    Consultants:   PT  Procedures:   None   Antimicrobials:   Rocephin 05/14/2017 -->   Subjective: No overnight events.   Objective: Vitals:   05/14/17 1948 05/14/17 2120 05/14/17 2149 05/15/17 0523  BP: (!) 161/76 (!) 162/62 (!) 159/74 (!) 151/62  Pulse: (!) 118 78 71 77  Resp: 18 (!) 21 20 18   Temp: 98 F (36.7  C) 98.6 F (37 C) 98.6 F (37 C) 98.4 F (36.9 C)  TempSrc: Oral Oral Oral Axillary  SpO2: 100% 97% 99% 97%  Weight:   63.3 kg (139 lb 9.6 oz)   Height:        Intake/Output Summary (Last 24 hours) at 05/15/17 1225 Last data filed at 05/14/17 2136  Gross per 24 hour  Intake             1050 ml  Output                0 ml  Net             1050 ml   Filed Weights   05/14/17 1632 05/14/17 2149  Weight: 65.8 kg (145 lb) 63.3 kg (139 lb 9.6 oz)    Examination:  General exam: Appears calm and comfortable  Respiratory system: Clear to auscultation. Respiratory effort normal. Cardiovascular system: S1 & S2 heard, RRR. No JVD, murmurs, rubs, gallops or clicks. No pedal edema. Gastrointestinal system: Abdomen is nondistended, soft and nontender. No organomegaly or masses felt. Normal bowel sounds heard. Central nervous system: sleeping, opens eyes when called her name but not following commands completely  Extremities: Symmetric 5 x 5 power. Skin: No rashes, lesions or ulcers Psychiatry: unable to fully assess as pt little lethargic   Data Reviewed: I have personally reviewed following labs and imaging studies  CBC:  Recent Labs Lab 05/14/17  1755 05/15/17 0350  WBC 16.8* 15.8*  NEUTROABS 14.1*  --   HGB 12.2 10.8*  HCT 38.0 34.2*  MCV 93.8 93.4  PLT 255 295   Basic Metabolic Panel:  Recent Labs Lab 05/14/17 1755 05/15/17 0350  NA 143 145  K 3.7 3.8  CL 108 112*  CO2 19* 19*  GLUCOSE 85 79  BUN 50* 50*  CREATININE 2.80* 2.61*  CALCIUM 12.0* 12.0*   GFR: Estimated Creatinine Clearance: 12.5 mL/min (A) (by C-G formula based on SCr of 2.61 mg/dL (H)). Liver Function Tests:  Recent Labs Lab 05/14/17 1755  AST 37  ALT 14  ALKPHOS 146*  BILITOT 1.2  PROT 7.7  ALBUMIN 3.1*   No results for input(s): LIPASE, AMYLASE in the last 168 hours. No results for input(s): AMMONIA in the last 168 hours. Coagulation Profile: No results for input(s): INR, PROTIME in  the last 168 hours. Cardiac Enzymes:  Recent Labs Lab 05/14/17 1755 05/15/17 0350  TROPONINI 0.07* 0.08*   BNP (last 3 results) No results for input(s): PROBNP in the last 8760 hours. HbA1C: No results for input(s): HGBA1C in the last 72 hours. CBG: No results for input(s): GLUCAP in the last 168 hours. Lipid Profile: No results for input(s): CHOL, HDL, LDLCALC, TRIG, CHOLHDL, LDLDIRECT in the last 72 hours. Thyroid Function Tests: No results for input(s): TSH, T4TOTAL, FREET4, T3FREE, THYROIDAB in the last 72 hours. Anemia Panel: No results for input(s): VITAMINB12, FOLATE, FERRITIN, TIBC, IRON, RETICCTPCT in the last 72 hours. Urine analysis:    Component Value Date/Time   COLORURINE YELLOW 05/14/2017 1641   APPEARANCEUR HAZY (A) 05/14/2017 1641   LABSPEC 1.014 05/14/2017 1641   PHURINE 5.0 05/14/2017 1641   GLUCOSEU NEGATIVE 05/14/2017 1641   HGBUR MODERATE (A) 05/14/2017 1641   BILIRUBINUR NEGATIVE 05/14/2017 1641   KETONESUR 5 (A) 05/14/2017 1641   PROTEINUR 30 (A) 05/14/2017 1641   UROBILINOGEN 1 04/16/2012 1205   NITRITE NEGATIVE 05/14/2017 1641   LEUKOCYTESUR SMALL (A) 05/14/2017 1641   Sepsis Labs: @LABRCNTIP (procalcitonin:4,lacticidven:4)   )No results found for this or any previous visit (from the past 240 hour(s)).    Radiology Studies: Ct Head Wo Contrast Result Date: 05/14/2017 No acute intracranial abnormalities. Chronic atrophy and small vessel ischemic changes. Skull metastases again demonstrated.   Dg Chest Port 1 View Result Date: 05/14/2017 1. No acute findings. 2. 12 mm left lower lung pulmonary nodule, new since 11/29/2011, likely corresponding to the metastatic disease seen on recent CT chest.     Scheduled Meds: . heparin  5,000 Units Subcutaneous Q8H  . losartan  100 mg Oral Daily  . polyethylene glycol  17 g Oral QODAY  . rosuvastatin  10 mg Oral Daily  . vitamin B-12  1,000 mcg Oral Daily   Continuous Infusions: . sodium chloride  75 mL/hr at 05/15/17 1116  . cefTRIAXone (ROCEPHIN)  IV       LOS: 1 day    Time spent: 25 minutes  Greater than 50% of the time spent on counseling and coordinating the care.   Leisa Lenz, MD [facility] Pager [phone]  If 7PM-7AM, please contact night-coverage [email or web address] Password [email or web address] 05/15/2017, 12:25 PM

## 2017-05-15 NOTE — Progress Notes (Signed)
CSW consulted for Home Health needs- unable to assist. Advise CM consult.  Sharren Bridge, MSW, LCSW Clinical Social Work 05/15/2017 [phone]

## 2017-05-15 NOTE — Evaluation (Signed)
Physical Therapy Evaluation Patient Details Name: KHAI TORBERT MRN: [id number] DOB: December 21, 1925 Today's Date: 05/15/2017   History of Present Illness  83 yo female admitted with sepsis, weakness. Hx of recent breast cancer dx, HTN  Clinical Impression  On eval, pt required Mod assist +2 for mobility. Pt sat EOB for at least 5 minutes during session. She stood x 2 for ~10-15 seconds each attempt. Pt is very weak and at high risk for falls. She was also very drowsy during session. No family present during session. Will follow and progress activity as tolerated. Recommend ST rehab at SNF if pt/family are agreeable. If pt returns home, recommend HHPT and 24 hour supervision/assist.      Follow Up Recommendations SNF    Equipment Recommendations  Hospital bed;Wheelchair (measurements PT);3in1 (PT) (continuing to assess)    Recommendations for Other Services       Precautions / Restrictions Precautions Precautions: Fall Restrictions Weight Bearing Restrictions: No      Mobility  Bed Mobility Overal bed mobility: Needs Assistance Bed Mobility: Supine to Sit;Sit to Supine     Supine to sit: Mod assist;+2 for physical assistance;+2 for safety/equipment;HOB elevated Sit to supine: Mod assist;+2 for physical assistance;+2 for safety/equipment;HOB elevated   General bed mobility comments: Assist for trunk and bil LEs. Multimodal cues for safety, technique, self assist. Utilized bedpad for scooting, positioning.   Transfers Overall transfer level: Needs assistance Equipment used: Rolling walker (2 wheeled) Transfers: Sit to/from Stand Sit to Stand: Mod assist;+2 physical assistance;+2 safety/equipment         General transfer comment: Assist to rise, stabilize, control descent.  Sit to stand x 2. Pt was able to stand for ~10-15 seconds, each attempt, before sitting abruptly. Very weak.   Ambulation/Gait             General Gait Details: NT-due to level of alertness  and weakness  Stairs            Wheelchair Mobility    Modified Rankin (Stroke Patients Only)       Balance Overall balance assessment: Needs assistance Sitting-balance support: Bilateral upper extremity supported;Feet supported Sitting balance-Leahy Scale: Poor Sitting balance - Comments: Intermittent assist needed to stabilize at EOB     Standing balance-Leahy Scale: Poor                               Pertinent Vitals/Pain Pain Assessment: No/denies pain    Home Living Family/patient expects to be discharged to:: Private residence Living Arrangements: Children (son) Available Help at Discharge: Family Type of Home: House       Home Layout: One level Home Equipment: Environmental consultant - 2 wheels      Prior Function           Comments: unsure of PLOF. No family present. Difficult to understand pt     Hand Dominance        Extremity/Trunk Assessment   Upper Extremity Assessment Upper Extremity Assessment: Generalized weakness    Lower Extremity Assessment Lower Extremity Assessment: Generalized weakness    Cervical / Trunk Assessment Cervical / Trunk Assessment: Kyphotic  Communication      Cognition Arousal/Alertness: Awake/alert (very drowsy) Behavior During Therapy: WFL for tasks assessed/performed Overall Cognitive Status: Difficult to assess  General Comments      Exercises     Assessment/Plan    PT Assessment Patient needs continued PT services  PT Problem List Decreased strength;Decreased mobility;Decreased activity tolerance;Decreased balance;Decreased knowledge of use of DME       PT Treatment Interventions DME instruction;Gait training;Therapeutic activities;Therapeutic exercise;Patient/family education;Balance training;Functional mobility training    PT Goals (Current goals can be found in the Care Plan section)  Acute Rehab PT Goals Patient Stated Goal: none  stated PT Goal Formulation: With patient Time For Goal Achievement: 05/29/17 Potential to Achieve Goals: Good    Frequency Min 3X/week   Barriers to discharge        Co-evaluation               AM-PAC PT "6 Clicks" Daily Activity  Outcome Measure Difficulty turning over in bed (including adjusting bedclothes, sheets and blankets)?: A Lot Difficulty moving from lying on back to sitting on the side of the bed? : A Lot Difficulty sitting down on and standing up from a chair with arms (e.g., wheelchair, bedside commode, etc,.)?: A Lot Help needed moving to and from a bed to chair (including a wheelchair)?: A Lot Help needed walking in hospital room?: Total Help needed climbing 3-5 steps with a railing? : Total 6 Click Score: 10    End of Session Equipment Utilized During Treatment: Gait belt Activity Tolerance: Patient limited by fatigue Patient left: in bed;with call bell/phone within reach;with bed alarm set   PT Visit Diagnosis: Muscle weakness (generalized) (M62.81);Difficulty in walking, not elsewhere classified (R26.2)    Time: 3212-2482 PT Time Calculation (min) (ACUTE ONLY): 14 min   Charges:   PT Evaluation $PT Eval Moderate Complexity: 1 Procedure     PT G Codes:        [place] Anna, MPT Pager: [phone]

## 2017-05-16 DIAGNOSIS — N179 Acute kidney failure, unspecified: Secondary | ICD-10-CM

## 2017-05-16 DIAGNOSIS — C50912 Malignant neoplasm of unspecified site of left female breast: Secondary | ICD-10-CM

## 2017-05-16 DIAGNOSIS — Z170 Estrogen receptor positive status [ER+]: Secondary | ICD-10-CM

## 2017-05-16 DIAGNOSIS — N390 Urinary tract infection, site not specified: Secondary | ICD-10-CM

## 2017-05-16 DIAGNOSIS — D630 Anemia in neoplastic disease: Secondary | ICD-10-CM

## 2017-05-16 DIAGNOSIS — N189 Chronic kidney disease, unspecified: Secondary | ICD-10-CM

## 2017-05-16 DIAGNOSIS — A419 Sepsis, unspecified organism: Secondary | ICD-10-CM

## 2017-05-16 LAB — CBC
HEMATOCRIT: 34.6 % — AB (ref 36.0–46.0)
Hemoglobin: 11 g/dL — ABNORMAL LOW (ref 12.0–15.0)
MCH: 29.8 pg (ref 26.0–34.0)
MCHC: 31.8 g/dL (ref 30.0–36.0)
MCV: 93.8 fL (ref 78.0–100.0)
Platelets: 245 10*3/uL (ref 150–400)
RBC: 3.69 MIL/uL — ABNORMAL LOW (ref 3.87–5.11)
RDW: 14.6 % (ref 11.5–15.5)
WBC: 16.3 10*3/uL — ABNORMAL HIGH (ref 4.0–10.5)

## 2017-05-16 LAB — BASIC METABOLIC PANEL
Anion gap: 11 (ref 5–15)
BUN: 44 mg/dL — AB (ref 6–20)
CALCIUM: 11.7 mg/dL — AB (ref 8.9–10.3)
CHLORIDE: 116 mmol/L — AB (ref 101–111)
CO2: 21 mmol/L — AB (ref 22–32)
CREATININE: 2.26 mg/dL — AB (ref 0.44–1.00)
GFR calc Af Amer: 21 mL/min — ABNORMAL LOW (ref >60)
GFR calc non Af Amer: 18 mL/min — ABNORMAL LOW (ref >60)
GLUCOSE: 90 mg/dL (ref 65–99)
Potassium: 3.6 mmol/L (ref 3.5–5.1)
Sodium: 148 mmol/L — ABNORMAL HIGH (ref 135–145)

## 2017-05-16 LAB — URINE CULTURE: Culture: >=100000 — AB

## 2017-05-16 MED ORDER — FULVESTRANT 250 MG/5ML IM SOLN
500.0000 mg | Freq: Once | INTRAMUSCULAR | Status: AC
  Administered 2017-05-17: 500 mg via INTRAMUSCULAR
  Filled 2017-05-16: qty 10

## 2017-05-16 MED ORDER — ACETAMINOPHEN 500 MG PO TABS
1000.0000 mg | ORAL_TABLET | Freq: Three times a day (TID) | ORAL | Status: DC
  Administered 2017-05-16 – 2017-05-22 (×8): 1000 mg via ORAL
  Filled 2017-05-16 (×13): qty 2

## 2017-05-16 NOTE — Progress Notes (Addendum)
Patient ID: Samantha Abbott, female   DOB: 1925-12-03, 83 y.o.   MRN: [id number]  PROGRESS NOTE    Samantha Abbott  QZE:[id number] DOB: January 05, 1925 DOA: 05/14/2017  PCP: Benito Mccreedy, MD   Brief Narrative:  83 year old female with past medical history of invasive ductal breast cancer (diagnosed 03/2017), hypertension, GERD who presented to ED with worsening weakness over past 4-5 days PTA> Pt is not able to walk and her appetite has significantly decreased.    Assessment & Plan:   Principal Problem:   Sepsis secondary to UTI ([place]) / Leukocytosis  - Sepsis criteria met on the admission with tachycardia, tachypnea, leukocytosis - Urinalysis was significant for small leukocytes so likely source sepsis - Continue Rocephin  - Blood and urine cultures pending as of this morning   Active Problems:   Carcinoma of upper-outer quadrant of left breast in female, estrogen receptor positive ([place]) - Appreciate palliative care assistance with goals of care    Anemia of chronic disease - Secondary to combination of chronic kidney disease and malignancy - Hemoglobin stable at 11      Elevated troponin - Likely demand ischemia from sepsis   - No chest pain     Hypercalcemia - Due to dehydration versus malignancy - Calcium 11.7 this morning    Essential hypertension - Continue Cozaar     Acute kidney injury superimposed on chronic kidney disease stage 4 (HCC) / metabolic acidosis - Creatinine improving since admission, 2.61 down to 2.26 this morning - Continue to monitor daily BMP   DVT prophylaxis: subQ Heparin  Code Status: DNR/DNI Family Communication: no family at the bedside this am Disposition Plan: SNF on Monday    Consultants:   PT  Palliative care   Procedures:   None   Antimicrobials:   Rocephin 05/14/2017 -->    Subjective: No overnight events.   Objective: Vitals:   05/15/17 0523 05/15/17 1355 05/15/17 2108 05/16/17 0551  BP: (!) 151/62   (!) 145/83 139/79  Pulse: 77 74 76 71  Resp: 18 18 16 14   Temp: 98.4 F (36.9 C) 98.6 F (37 C) 97.9 F (36.6 C) 98.2 F (36.8 C)  TempSrc: Axillary Axillary Oral Oral  SpO2: 97% 94% 100% 99%  Weight:      Height:        Intake/Output Summary (Last 24 hours) at 05/16/17 1018 Last data filed at 05/16/17 1016  Gross per 24 hour  Intake           2157.5 ml  Output                1 ml  Net           2156.5 ml   Filed Weights   05/14/17 1632 05/14/17 2149  Weight: 65.8 kg (145 lb) 63.3 kg (139 lb 9.6 oz)    Examination:  General exam: Appears calm and comfortable, no distress  Respiratory system: No wheezing, no rhonchi  Cardiovascular system: S1 & S2 heard, Rate controlled  Gastrointestinal system: Abdomen is obese, non tender, (+) BS Central nervous system: No focal neurological deficits. Extremities: No swelling, palpable pulses Skin: warm, dry  Psychiatry: Normal mood and behavior  Data Reviewed: I have personally reviewed following labs and imaging studies  CBC:  Recent Labs Lab 05/14/17 1755 05/15/17 0350 05/16/17 0335  WBC 16.8* 15.8* 16.3*  NEUTROABS 14.1*  --   --   HGB 12.2 10.8* 11.0*  HCT 38.0 34.2* 34.6*  MCV 93.8  93.4 93.8  PLT 255 252 622   Basic Metabolic Panel:  Recent Labs Lab 05/14/17 1755 05/15/17 0350 05/16/17 0335  NA 143 145 148*  K 3.7 3.8 3.6  CL 108 112* 116*  CO2 19* 19* 21*  GLUCOSE 85 79 90  BUN 50* 50* 44*  CREATININE 2.80* 2.61* 2.26*  CALCIUM 12.0* 12.0* 11.7*   GFR: Estimated Creatinine Clearance: 14.4 mL/min (A) (by C-G formula based on SCr of 2.26 mg/dL (H)). Liver Function Tests:  Recent Labs Lab 05/14/17 1755  AST 37  ALT 14  ALKPHOS 146*  BILITOT 1.2  PROT 7.7  ALBUMIN 3.1*   No results for input(s): LIPASE, AMYLASE in the last 168 hours. No results for input(s): AMMONIA in the last 168 hours. Coagulation Profile: No results for input(s): INR, PROTIME in the last 168 hours. Cardiac  Enzymes:  Recent Labs Lab 05/14/17 1755 05/15/17 0350  TROPONINI 0.07* 0.08*   BNP (last 3 results) No results for input(s): PROBNP in the last 8760 hours. HbA1C: No results for input(s): HGBA1C in the last 72 hours. CBG: No results for input(s): GLUCAP in the last 168 hours. Lipid Profile: No results for input(s): CHOL, HDL, LDLCALC, TRIG, CHOLHDL, LDLDIRECT in the last 72 hours. Thyroid Function Tests: No results for input(s): TSH, T4TOTAL, FREET4, T3FREE, THYROIDAB in the last 72 hours. Anemia Panel: No results for input(s): VITAMINB12, FOLATE, FERRITIN, TIBC, IRON, RETICCTPCT in the last 72 hours. Urine analysis:    Component Value Date/Time   COLORURINE YELLOW 05/14/2017 1641   APPEARANCEUR HAZY (A) 05/14/2017 1641   LABSPEC 1.014 05/14/2017 1641   PHURINE 5.0 05/14/2017 1641   GLUCOSEU NEGATIVE 05/14/2017 1641   HGBUR MODERATE (A) 05/14/2017 1641   BILIRUBINUR NEGATIVE 05/14/2017 1641   KETONESUR 5 (A) 05/14/2017 1641   PROTEINUR 30 (A) 05/14/2017 1641   UROBILINOGEN 1 04/16/2012 1205   NITRITE NEGATIVE 05/14/2017 1641   LEUKOCYTESUR SMALL (A) 05/14/2017 1641   Sepsis Labs: @LABRCNTIP (procalcitonin:4,lacticidven:4)   )No results found for this or any previous visit (from the past 240 hour(s)).    Radiology Studies: Ct Head Wo Contrast Result Date: 05/14/2017 No acute intracranial abnormalities. Chronic atrophy and small vessel ischemic changes. Skull metastases again demonstrated. Electronically Signed   By: Lucienne Capers M.D.   On: 05/14/2017 23:45   Dg Chest Port 1 View Result Date: 05/14/2017 1. No acute findings. 2. 12 mm left lower lung pulmonary nodule, new since 11/29/2011, likely corresponding to the metastatic disease seen on recent CT chest. Electronically Signed   By: Lucrezia Europe M.D.   On: 05/14/2017 17:13      Scheduled Meds: . feeding supplement (ENSURE ENLIVE)  237 mL Oral TID BM  . heparin  5,000 Units Subcutaneous Q8H  . losartan  100  mg Oral Daily  . polyethylene glycol  17 g Oral QODAY  . rosuvastatin  10 mg Oral Daily  . vitamin B-12  1,000 mcg Oral Daily   Continuous Infusions: . sodium chloride 75 mL/hr at 05/16/17 0036  . cefTRIAXone (ROCEPHIN)  IV Stopped (05/15/17 2135)     LOS: 2 days    Time spent: 25 minutes  Greater than 50% of the time spent on counseling and coordinating the care.   Leisa Lenz, MD [facility] Pager [phone]  If 7PM-7AM, please contact night-coverage [email or web address] Password [email or web address] 05/16/2017, 10:18 AM

## 2017-05-16 NOTE — Progress Notes (Signed)
CSW following for disposition/ DC planning. Had met with pt this morning concerning PT recommendation for SNF and pt had agreed. Faxed referrals.  However, received call from pt's son with whom she lives, he states family and pt wish for her to return home at DC, are not interested in SNF. States pt has 24/7 supervision and "she lives with family who are RNs and CNAs." States family is interested in [facility].   Son agrees to have pt or family contact CSW if interest in SNF changes.  Sharren Bridge, MSW, LCSW Clinical Social Work 05/16/2017 [phone]

## 2017-05-16 NOTE — [place] FL2 (Signed)
[place] LEVEL OF CARE SCREENING TOOL     IDENTIFICATION  Patient Name: Samantha Abbott Birthdate: May 19, 1925 Sex: female Admission Date (Current Location): 05/14/2017  [facility] Emergency Dept and [place] Number:  Herbalist and Address:  [facility],  [place], [place]      Provider Number: [id number]  Attending Physician Name and Address:  Robbie Lis, MD  Relative Name and Phone Number:       Current Level of Care: Hospital Recommended Level of Care: [place] Prior Approval Number:    Date Approved/Denied:   PASRR Number: [id number]  Discharge Plan: SNF    Current Diagnoses: Patient Active Problem List   Diagnosis Date Noted  . Elevated troponin 05/15/2017  . Hypercalcemia 05/15/2017  . Essential hypertension 05/15/2017  . Acute kidney injury superimposed on chronic kidney disease ([place]) 05/15/2017  . Sepsis secondary to UTI ([place]) 05/14/2017  . Carcinoma of upper-outer quadrant of left breast in female, estrogen receptor positive ([place]) 04/21/2017  . H/O colostomy-Closed 2013 06/04/2012  . 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 GERD (gastroesophageal reflux disease) 10/31/2011  . Irregular heartbeat 10/31/2011    Orientation RESPIRATION BLADDER Height & Weight     Self, Situation, Place  Normal Incontinent Weight: 139 lb 9.6 oz (63.3 kg) Height:  5\' 1"  (154.9 cm)  BEHAVIORAL SYMPTOMS/MOOD NEUROLOGICAL BOWEL NUTRITION STATUS      Incontinent Diet (Heart Healthy)  AMBULATORY STATUS COMMUNICATION OF NEEDS Skin   Extensive Assist Verbally Normal                       Personal Care Assistance Level of Assistance  Bathing, Feeding, Dressing Bathing Assistance: Limited assistance Feeding assistance: Limited assistance Dressing Assistance: Limited assistance     Functional Limitations Info  Sight, Hearing, Speech Sight Info:  Adequate Hearing Info: Adequate Speech Info: Adequate    SPECIAL CARE FACTORS FREQUENCY  PT (By licensed PT), OT (By licensed OT)     PT Frequency: 5x OT Frequency: 5x            Contractures Contractures Info: Not present    Additional Factors Info  Code Status, Allergies Code Status Info: DNR Allergies Info: nka           Current Medications (05/16/2017):  This is the current hospital active medication list Current Facility-Administered Medications  Medication Dose Route Frequency Provider Last Rate Last Dose  . 0.9 %  sodium chloride infusion   Intravenous Continuous Fuller Plan A, MD 75 mL/hr at 05/16/17 0036    . acetaminophen (TYLENOL) tablet 650 mg  650 mg Oral Q6H PRN Norval Morton, MD       Or  . acetaminophen (TYLENOL) suppository 650 mg  650 mg Rectal Q6H PRN Smith, Rondell A, MD      . albuterol (PROVENTIL) (2.5 MG/3ML) 0.083% nebulizer solution 2.5 mg  2.5 mg Nebulization Q4H PRN Smith, Rondell A, MD      . cefTRIAXone (ROCEPHIN) 1 g in dextrose 5 % 50 mL IVPB  1 g Intravenous Q24H Polly Cobia, RPH   Stopped at 05/15/17 2135  . feeding supplement (ENSURE ENLIVE) (ENSURE ENLIVE) liquid 237 mL  237 mL Oral TID BM Robbie Lis, MD   237 mL at 05/15/17 2034  . heparin injection 5,000 Units  5,000 Units Subcutaneous Q8H Fuller Plan A, MD   5,000 Units at 05/16/17 0618  . losartan (COZAAR)  tablet 100 mg  100 mg Oral Daily Fuller Plan A, MD   100 mg at 05/16/17 5885  . ondansetron (ZOFRAN) tablet 4 mg  4 mg Oral Q6H PRN Fuller Plan A, MD       Or  . ondansetron (ZOFRAN) injection 4 mg  4 mg Intravenous Q6H PRN Smith, Rondell A, MD      . oxyCODONE (Oxy IR/ROXICODONE) immediate release tablet 5 mg  5 mg Oral Q4H PRN Smith, Rondell A, MD      . polyethylene glycol (MIRALAX / GLYCOLAX) packet 17 g  17 g Oral QODAY Smith, Rondell A, MD      . rosuvastatin (CRESTOR) tablet 10 mg  10 mg Oral Daily Fuller Plan A, MD   10 mg at 05/16/17 0928  .  vitamin B-12 (CYANOCOBALAMIN) tablet 1,000 mcg  1,000 mcg Oral Daily Fuller Plan A, MD   1,000 mcg at 05/16/17 0277     Discharge Medications: Please see discharge summary for a list of discharge medications.  Relevant Imaging Results:  Relevant Lab Results:   Additional Information SS# [id number]  Nila Nephew, LCSW

## 2017-05-16 NOTE — Progress Notes (Signed)
Palliative medicine consult received. Meeting set for 10AM today, family were no show. I will contact son to reschedule or attempt to clarify goals over the phone. She has more energy today and is responding , but she remains extremely weak and frail. She would probably not be a very good candidate for surgery-and she has metastatic skeletal disease. Will schedule Tylenol for pain. Further recommendations to follow once family able to meet and make decisions.  Lane Hacker, DO Palliative Medicine [phone]  No Charge

## 2017-05-16 NOTE — Progress Notes (Signed)
Samantha Abbott   DOB:1925-09-08   AQ#:[id number]   KTG#:[id number]   ONCOLOGY FOLLOW UP   Subjective: Pt's son called our service, and a request to be seen by [place]. Patient was first seen by my partner Dr. Noralee Chars at on May 12, 2017 for a neglected left breast cancer. Patient's son would like to know the staging CT and bone scan results. Pt is very drowsy, could not give much history. Her son is worried about previous scans that were completed. She is taking 2 pills at home, he is unaware of the names. She is in the hospital for dehydration and UTI. She previously moved around in the house up until last month, when her breast started bothering her. Since her pain, she rather do mastectomy.   Objective:  Vitals:   05/16/17 1413 05/16/17 2042  BP: (!) 155/88 (!) 143/87  Pulse: 71 70  Resp: 16 12  Temp: 97.8 F (36.6 C) 97.8 F (36.6 C)    Body mass index is 26.38 kg/m.  Intake/Output Summary (Last 24 hours) at 05/16/17 2151 Last data filed at 05/16/17 1844  Gross per 24 hour  Intake           2062.5 ml  Output                3 ml  Net           2059.5 ml     Sclerae unicteric  Oropharynx clear  No peripheral adenopathy  Lungs clear -- no rales or rhonchi  Heart regular rate and rhythm  Abdomen benign  MSK no focal spinal tenderness, no peripheral edema  Neuro nonfocal  CBG (last 3)  No results for input(s): GLUCAP in the last 72 hours.   Labs:  Lab Results  Component Value Date   WBC 16.3 (H) 05/16/2017   HGB 11.0 (L) 05/16/2017   HCT 34.6 (L) 05/16/2017   MCV 93.8 05/16/2017   PLT 245 05/16/2017   NEUTROABS 14.1 (H) 05/14/2017   CMP Latest Ref Rng & Units 05/16/2017 05/15/2017 05/14/2017  Glucose 65 - 99 mg/dL 90 79 85  BUN 6 - 20 mg/dL 44(H) 50(H) 50(H)  Creatinine 0.44 - 1.00 mg/dL 2.26(H) 2.61(H) 2.80(H)  Sodium 135 - 145 mmol/L 148(H) 145 143  Potassium 3.5 - 5.1 mmol/L 3.6 3.8 3.7  Chloride 101 - 111 mmol/L 116(H) 112(H) 108  CO2 22 - 32 mmol/L 21(L) 19(L)  19(L)  Calcium 8.9 - 10.3 mg/dL 11.7(H) 12.0(H) 12.0(H)  Total Protein 6.5 - 8.1 g/dL - - 7.7  Total Bilirubin 0.3 - 1.2 mg/dL - - 1.2  Alkaline Phos 38 - 126 U/L - - 146(H)  AST 15 - 41 U/L - - 37  ALT 14 - 54 U/L - - 14    Urine Studies No results for input(s): UHGB, CRYS in the last 72 hours.  Invalid input(s): UACOL, UAPR, USPG, UPH, UTP, UGL, UKET, UBIL, UNIT, UROB, ULEU, UEPI, UWBC, [place], [place], CAST, [place], [place]  Basic Metabolic Panel:  Recent Labs Lab 05/14/17 1755 05/15/17 0350 05/16/17 0335  NA 143 145 148*  K 3.7 3.8 3.6  CL 108 112* 116*  CO2 19* 19* 21*  GLUCOSE 85 79 90  BUN 50* 50* 44*  CREATININE 2.80* 2.61* 2.26*  CALCIUM 12.0* 12.0* 11.7*   GFR Estimated Creatinine Clearance: 14.4 mL/min (A) (by C-G formula based on SCr of 2.26 mg/dL (H)). Liver Function Tests:  Recent Labs Lab 05/14/17 1755  AST 37  ALT 14  ALKPHOS 146*  BILITOT 1.2  PROT 7.7  ALBUMIN 3.1*   No results for input(s): LIPASE, AMYLASE in the last 168 hours. No results for input(s): AMMONIA in the last 168 hours. Coagulation profile No results for input(s): INR, PROTIME in the last 168 hours.  CBC:  Recent Labs Lab 05/14/17 1755 05/15/17 0350 05/16/17 0335  WBC 16.8* 15.8* 16.3*  NEUTROABS 14.1*  --   --   HGB 12.2 10.8* 11.0*  HCT 38.0 34.2* 34.6*  MCV 93.8 93.4 93.8  PLT 255 252 245   Cardiac Enzymes:  Recent Labs Lab 05/14/17 1755 05/15/17 0350  TROPONINI 0.07* 0.08*   BNP: Invalid input(s): POCBNP CBG: No results for input(s): GLUCAP in the last 168 hours. D-Dimer No results for input(s): DDIMER in the last 72 hours. Hgb A1c No results for input(s): HGBA1C in the last 72 hours. Lipid Profile No results for input(s): CHOL, HDL, LDLCALC, TRIG, CHOLHDL, LDLDIRECT in the last 72 hours. Thyroid function studies No results for input(s): TSH, T4TOTAL, T3FREE, THYROIDAB in the last 72 hours.  Invalid input(s): FREET3 Anemia work up No results for  input(s): VITAMINB12, FOLATE, FERRITIN, TIBC, IRON, RETICCTPCT in the last 72 hours. Microbiology Recent Results (from the past 240 hour(s))  Urine culture     Status: Abnormal   Collection Time: 05/14/17  4:41 PM  Result Value Ref Range Status   Specimen Description URINE, CLEAN CATCH  Final   Special Requests NONE  Final   Culture (A)  Final    >=100,000 COLONIES/mL LACTOBACILLUS SPECIES Standardized susceptibility testing for this organism is not available. Performed at [facility], [place] [address], [place], [place] [address]    Report Status 05/16/2017 FINAL  Final  Blood culture (routine x 2)     Status: None (Preliminary result)   Collection Time: 05/14/17  9:13 PM  Result Value Ref Range Status   Specimen Description BLOOD BLOOD LEFT HAND  Final   Special Requests IN PEDIATRIC BOTTLE Blood Culture adequate volume  Final   Culture   Final    NO GROWTH 1 DAY Performed at [facility], [place] [address], [place], [place] [address]    Report Status PENDING  Incomplete  Blood culture (routine x 2)     Status: None (Preliminary result)   Collection Time: 05/14/17 10:20 PM  Result Value Ref Range Status   Specimen Description BLOOD RIGHT ARM  Final   Special Requests IN PEDIATRIC BOTTLE Blood Culture adequate volume  Final   Culture   Final    NO GROWTH 1 DAY Performed at [facility], [place] [address], [place], [place] [address]    Report Status PENDING  Incomplete      Studies:  Ct Head Wo Contrast  Result Date: 05/14/2017 CLINICAL DATA:  Increasing weakness for several days. Altered mental status. Recent diagnosis of bilateral breast cancer. History of hypertension. EXAM: CT HEAD WITHOUT CONTRAST TECHNIQUE: Contiguous axial images were obtained from the base of the skull through the vertex without intravenous contrast. COMPARISON:  05/06/2017 FINDINGS: Brain: Diffuse cerebral atrophy. Ventricular dilatation consistent with central atrophy. Low-attenuation  changes in the deep white matter consistent with small vessel ischemia. No mass effect or midline shift. No abnormal extra-axial fluid collections. Gray-white matter junctions are distinct. Basal cisterns are not effaced. No acute intracranial hemorrhage. Vascular: Vascular calcifications are present. Skull: Circumscribed lucent lesions in the calvarium consistent with known metastatic disease. No depressed skull fractures. Sinuses/Orbits: Paranasal sinuses and mastoid  air cells are clear. Other: No significant changes since previous study. IMPRESSION: No acute intracranial abnormalities. Chronic atrophy and small vessel ischemic changes. Skull metastases again demonstrated. Electronically Signed   By: Lucienne Capers M.D.   On: 05/14/2017 23:45    Assessment: 83 y.o.   1. Metastatic left breast cancer, ER+ 2. Sepsis secondary to UTI 3. Acute on chronic kidney disease 4. Hypercalcemia, secondary to metastatic breast cancer 5. HTN 6. Anemia in neoplastic disease  Recommendations  - I reviewed the staging CT and bone scan findings with the patient's son in detail, and gave him the copies of the reports. He will share with his sister.  -Unfortunately  her cancer has spread to bone, lung, liver and left adrenal gland . She needs systemic therapy.  -We discussed her cancer is not curable. Her treatment is palliative.  -ER positive breast cancer is very treatable, she has recently started fulvestrant, plan to add Ibrance next week.the treatment is very tolerable, I strongly recommend her to continue. She is due for second dose fulvestrant, I would order for tomorrow -We also discussed palliative and supportive care, she is being seen by palliative care team, appreciated the input.  -please watch her hypercalcemia closely, due to her renal failure, may consider pamidronate or Xgeva if Ca>12 -Dr. Jana Hakim will f/u on Tusday when he returns to office. Please call us if questions over the weekend.     This document serves as a record of services personally performed by Truitt Merle, MD. It was created on her behalf by Brandt Loosen, a trained medical scribe. The creation of this record is based on the scribe's personal observations and the provider's statements to them. This document has been checked and approved by the attending provider.   Truitt Merle, MD 05/16/2017

## 2017-05-16 NOTE — Clinical Social Work Note (Signed)
Clinical Social Work Assessment  Patient Details  Name: Samantha Abbott MRN: [id number] Date of Birth: [birth year]  Date of referral:  05/16/17               Reason for consult:  Facility Placement                Permission sought to share information with:  Family Supports Permission granted to share information::  Yes, Verbal Permission Granted  Name::     [facility] [phone]  Agency::     Relationship::     Contact Information:     Housing/Transportation Living arrangements for the past 2 months:  Single Family Home (with son) Source of Information:  Patient Patient Interpreter Needed:    Criminal Activity/Legal Involvement Pertinent to Current Situation/Hospitalization:  No - Comment as needed Significant Relationships:    Lives with:  Adult Children Do you feel safe going back to the place where you live?  Yes Need for family participation in patient care:  No (Coment)  Care giving concerns:  Pt from home where she lives with her son. Reports he assists her at home but is vague re: specifics. States she is agreeable to rehab at discharge. Advises speak with son as well- CSW left voicemail.   Social Worker assessment / plan:  CSW consulted for SNF placement. Met with pt at bedside, explained role. Pt receptive and agrees to SNF referral, no facility preference (CSW awaiting call from son re: family preference for facility.  Complted FL2 and obtained PASSR. Referred to area SNFs.  Plan: for SNF at DC. Will follow up with pt family for bed offers.   Employment status:  Retired Forensic scientist:  Medicare PT Recommendations:  [place] / Referral to community resources:  [place]  Patient/Family's Response to care:  Pt appreciative of care  Patient/Family's Understanding of and Emotional Response to Diagnosis, Current Treatment, and Prognosis:  Pt demonstrates adequate understanding of plan. Minimal emotional response-  was engaged but drowsy.  Emotional Assessment Appearance:  Appears stated age Attitude/Demeanor/Rapport:   (pleasant, somewhat drowsy) Affect (typically observed):  Accepting, Calm Orientation:  Oriented to Self, Oriented to Place, Oriented to Situation Alcohol / Substance use:  Not Applicable Psych involvement (Current and /or in the community):  No (Comment)  Discharge Needs  Concerns to be addressed:  Discharge Planning Concerns Readmission within the last 30 days:  No Current discharge risk:  None Barriers to Discharge:  Continued Medical Work up   [employer], LCSW 05/16/2017, 10:38 AM  [phone]

## 2017-05-17 LAB — BASIC METABOLIC PANEL WITH GFR
Anion gap: 9 (ref 5–15)
BUN: 36 mg/dL — ABNORMAL HIGH (ref 6–20)
CO2: 24 mmol/L (ref 22–32)
Calcium: 12 mg/dL — ABNORMAL HIGH (ref 8.9–10.3)
Chloride: 119 mmol/L — ABNORMAL HIGH (ref 101–111)
Creatinine, Ser: 1.78 mg/dL — ABNORMAL HIGH (ref 0.44–1.00)
GFR calc Af Amer: 28 mL/min — ABNORMAL LOW
GFR calc non Af Amer: 24 mL/min — ABNORMAL LOW
Glucose, Bld: 108 mg/dL — ABNORMAL HIGH (ref 65–99)
Potassium: 3.2 mmol/L — ABNORMAL LOW (ref 3.5–5.1)
Sodium: 152 mmol/L — ABNORMAL HIGH (ref 135–145)

## 2017-05-17 LAB — CBC
HCT: 37.1 % (ref 36.0–46.0)
Hemoglobin: 11.7 g/dL — ABNORMAL LOW (ref 12.0–15.0)
MCH: 29.5 pg (ref 26.0–34.0)
MCHC: 31.5 g/dL (ref 30.0–36.0)
MCV: 93.5 fL (ref 78.0–100.0)
Platelets: 262 10*3/uL (ref 150–400)
RBC: 3.97 MIL/uL (ref 3.87–5.11)
RDW: 14.4 % (ref 11.5–15.5)
WBC: 17.1 10*3/uL — ABNORMAL HIGH (ref 4.0–10.5)

## 2017-05-17 MED ORDER — POTASSIUM CHLORIDE CRYS ER 20 MEQ PO TBCR
40.0000 meq | EXTENDED_RELEASE_TABLET | Freq: Once | ORAL | Status: AC
  Administered 2017-05-17: 40 meq via ORAL
  Filled 2017-05-17: qty 2

## 2017-05-17 MED ORDER — SODIUM CHLORIDE 0.9 % IV SOLN
60.0000 mg | Freq: Once | INTRAVENOUS | Status: AC
  Administered 2017-05-17: 60 mg via INTRAVENOUS
  Filled 2017-05-17: qty 6.67

## 2017-05-17 MED ORDER — HYDRALAZINE HCL 20 MG/ML IJ SOLN
10.0000 mg | INTRAMUSCULAR | Status: DC | PRN
  Administered 2017-05-17 – 2017-05-18 (×2): 10 mg via INTRAVENOUS
  Filled 2017-05-17 (×2): qty 1

## 2017-05-17 NOTE — Progress Notes (Signed)
Patient ID: Samantha Abbott, female   DOB: August 03, 1925, 83 y.o.   MRN: [id number]  PROGRESS NOTE    Samantha Abbott  WPY:[id number] DOB: [birth year] DOA: 05/14/2017  PCP: Benito Mccreedy, MD   Brief Narrative:  83 year old female with past medical history of invasive ductal breast cancer (diagnosed 03/2017), hypertension, GERD who presented to ED with worsening weakness over past 4-5 days PTA>Pt is not able to walk and her appetite has significantly decreased.    Assessment & Plan:   Principal Problem:   Sepsis secondary to UTI ([place]) / Leukocytosis  - Sepsis criteria met on the admission with tachycardia, tachypnea, leukocytosis - Source of infection is UTI - Continue Rocephin  Active Problems:   Carcinoma of upper-outer quadrant of left breast in female, estrogen receptor positive ([place]) / metastasis to lung, bone, liver and in the left adrenal gland - Oncology has seen the patient in consultation, per oncology, cancer has spread to bone, lung, liver and left adrenal gland. Cancer is not curable and treatment is palliative. Patient is due for second dose of fulvestrant which is ordered for today - PCT following for GOC     Anemia of chronic disease - Due to malignancy as well as chronic kidney disease - Hemoglobin 11.7 this morning    Elevated troponin - Likely demand ischemia from sepsis   - No reports of chest pain     Hypercalcemia - Secondary to dehydration versus hypercalcemia of malignancy - Calcium 12 this am and per oncology if Calcium more than 12 can consider pamidronate (order placed for pamidronate this am)    Essential hypertension - Continue Cozaar    Acute kidney injury superimposed on chronic kidney disease stage 4 (HCC) / metabolic acidosis - Creatinine continues to improve since the admission, 1.78 morning - Continue to monitor daily BMP    Moderate protein calorie malnutrition - In the context of chronic illness - Seen by dietician     DVT prophylaxis: subQ Heparin  Code Status: DNR/DNI Family Communication: no family at the bedside this am Disposition Plan: SNF on Monday    Consultants:   PT  Palliative care   Procedures:   None   Antimicrobials:   Rocephin 05/14/2017 -->     Subjective: No overnight events.   Objective: Vitals:   05/16/17 0551 05/16/17 1413 05/16/17 2042 05/17/17 0531  BP: 139/79 (!) 155/88 (!) 143/87 (!) 130/96  Pulse: 71 71 70 73  Resp: 14 16 12 14   Temp: 98.2 F (36.8 C) 97.8 F (36.6 C) 97.8 F (36.6 C) 98.4 F (36.9 C)  TempSrc: Oral Oral Oral Oral  SpO2: 99% 100% 100% 100%  Weight:      Height:        Intake/Output Summary (Last 24 hours) at 05/17/17 0953 Last data filed at 05/17/17 0630  Gross per 24 hour  Intake           2532.5 ml  Output                3 ml  Net           2529.5 ml   Filed Weights   05/14/17 1632 05/14/17 2149  Weight: 65.8 kg (145 lb) 63.3 kg (139 lb 9.6 oz)    Examination:  General exam: No distress  Respiratory system: Clear to auscultation. Respiratory effort normal. Cardiovascular system: S1 & S2 heard, RRR Gastrointestinal system: Abdomen is nondistended, soft and nontender. No organomegaly or masses felt. Normal bowel  sounds heard. Central nervous system: No focal neurological deficits. Extremities: Symmetric 5 x 5 power. Skin: No rashes, lesions or ulcers Psychiatry: Mood & affect appropriate.   Data Reviewed: I have personally reviewed following labs and imaging studies  CBC:  Recent Labs Lab 05/14/17 1755 05/15/17 0350 05/16/17 0335 05/17/17 0719  WBC 16.8* 15.8* 16.3* 17.1*  NEUTROABS 14.1*  --   --   --   HGB 12.2 10.8* 11.0* 11.7*  HCT 38.0 34.2* 34.6* 37.1  MCV 93.8 93.4 93.8 93.5  PLT 255 252 245 025   Basic Metabolic Panel:  Recent Labs Lab 05/14/17 1755 05/15/17 0350 05/16/17 0335 05/17/17 0719  NA 143 145 148* 152*  K 3.7 3.8 3.6 3.2*  CL 108 112* 116* 119*  CO2 19* 19* 21* 24   GLUCOSE 85 79 90 108*  BUN 50* 50* 44* 36*  CREATININE 2.80* 2.61* 2.26* 1.78*  CALCIUM 12.0* 12.0* 11.7* 12.0*   GFR: Estimated Creatinine Clearance: 18.3 mL/min (A) (by C-G formula based on SCr of 1.78 mg/dL (H)). Liver Function Tests:  Recent Labs Lab 05/14/17 1755  AST 37  ALT 14  ALKPHOS 146*  BILITOT 1.2  PROT 7.7  ALBUMIN 3.1*   No results for input(s): LIPASE, AMYLASE in the last 168 hours. No results for input(s): AMMONIA in the last 168 hours. Coagulation Profile: No results for input(s): INR, PROTIME in the last 168 hours. Cardiac Enzymes:  Recent Labs Lab 05/14/17 1755 05/15/17 0350  TROPONINI 0.07* 0.08*   BNP (last 3 results) No results for input(s): PROBNP in the last 8760 hours. HbA1C: No results for input(s): HGBA1C in the last 72 hours. CBG: No results for input(s): GLUCAP in the last 168 hours. Lipid Profile: No results for input(s): CHOL, HDL, LDLCALC, TRIG, CHOLHDL, LDLDIRECT in the last 72 hours. Thyroid Function Tests: No results for input(s): TSH, T4TOTAL, FREET4, T3FREE, THYROIDAB in the last 72 hours. Anemia Panel: No results for input(s): VITAMINB12, FOLATE, FERRITIN, TIBC, IRON, RETICCTPCT in the last 72 hours. Urine analysis:    Component Value Date/Time   COLORURINE YELLOW 05/14/2017 1641   APPEARANCEUR HAZY (A) 05/14/2017 1641   LABSPEC 1.014 05/14/2017 1641   PHURINE 5.0 05/14/2017 1641   GLUCOSEU NEGATIVE 05/14/2017 1641   HGBUR MODERATE (A) 05/14/2017 1641   BILIRUBINUR NEGATIVE 05/14/2017 1641   KETONESUR 5 (A) 05/14/2017 1641   PROTEINUR 30 (A) 05/14/2017 1641   UROBILINOGEN 1 04/16/2012 1205   NITRITE NEGATIVE 05/14/2017 1641   LEUKOCYTESUR SMALL (A) 05/14/2017 1641   Sepsis Labs: @LABRCNTIP (procalcitonin:4,lacticidven:4)   ) Recent Results (from the past 240 hour(s))  Urine culture     Status: Abnormal   Collection Time: 05/14/17  4:41 PM  Result Value Ref Range Status   Specimen Description URINE, CLEAN CATCH   Final   Special Requests NONE  Final   Culture (A)  Final    >=100,000 COLONIES/mL LACTOBACILLUS SPECIES Standardized susceptibility testing for this organism is not available. Performed at [facility], [place] [address], [place], [place] [address]    Report Status 05/16/2017 FINAL  Final  Blood culture (routine x 2)     Status: None (Preliminary result)   Collection Time: 05/14/17  9:13 PM  Result Value Ref Range Status   Specimen Description BLOOD BLOOD LEFT HAND  Final   Special Requests IN PEDIATRIC BOTTLE Blood Culture adequate volume  Final   Culture   Final    NO GROWTH 1 DAY Performed at [facility],  [address], [place], [place] [address]    Report Status PENDING  Incomplete  Blood culture (routine x 2)     Status: None (Preliminary result)   Collection Time: 05/14/17 10:20 PM  Result Value Ref Range Status   Specimen Description BLOOD RIGHT ARM  Final   Special Requests IN PEDIATRIC BOTTLE Blood Culture adequate volume  Final   Culture   Final    NO GROWTH 1 DAY Performed at [facility], [place] [address] [place],  [address]    Report Status PENDING  Incomplete      Radiology Studies: Ct Head Wo Contrast  Result Date: 05/14/2017 CLINICAL DATA:  Increasing weakness for several days. Altered mental status. Recent diagnosis of bilateral breast cancer. History of hypertension. EXAM: CT HEAD WITHOUT CONTRAST TECHNIQUE: Contiguous axial images were obtained from the base of the skull through the vertex without intravenous contrast. COMPARISON:  05/06/2017 FINDINGS: Brain: Diffuse cerebral atrophy. Ventricular dilatation consistent with central atrophy. Low-attenuation changes in the deep white matter consistent with small vessel ischemia. No mass effect or midline shift. No abnormal extra-axial fluid collections. Gray-white matter junctions are distinct. Basal cisterns are not effaced. No acute intracranial hemorrhage. Vascular: Vascular calcifications  are present. Skull: Circumscribed lucent lesions in the calvarium consistent with known metastatic disease. No depressed skull fractures. Sinuses/Orbits: Paranasal sinuses and mastoid air cells are clear. Other: No significant changes since previous study. IMPRESSION: No acute intracranial abnormalities. Chronic atrophy and small vessel ischemic changes. Skull metastases again demonstrated. Electronically Signed   By: Lucienne Capers M.D.   On: 05/14/2017 23:45   Dg Chest Port 1 View  Result Date: 05/14/2017 CLINICAL DATA:  Arneta Cliche SINCE Friday. PER EMS, THE PT HAS A HIS OF BILATERAL BREAST CA, BUT NOT ON ANY TREATMENT YET. PER THE SON, THE PT HAS ELECTED TO HAVE SURGERY, BUT THEY ARE WAITING FOR THE ORDERS. THE PT HAS NOT BEEN EATING, DRINKING, AND INCONTINENT OF URINE AND STOOL. PT AAOX4, BUT SLOW TO RESPOND. PT STS, "ALL OF MY ENERGY IS GONE." EXAM: PORTABLE CHEST - 1 VIEW COMPARISON:  CT 05/06/2017 and previous FINDINGS: 12 mm nodule, left lower lobe. Right lung clear. Atheromatous aorta. Mild cardiomegaly. No effusion.  No pneumothorax. Anterior vertebral endplate spurring at multiple levels in the mid and lower thoracic spine. Mild degenerative change in the right shoulder. IMPRESSION: 1. No acute findings. 2. 12 mm left lower lung pulmonary nodule, new since 11/29/2011, likely corresponding to the metastatic disease seen on recent CT chest. Electronically Signed   By: Lucrezia Europe M.D.   On: 05/14/2017 17:13       Scheduled Meds: . acetaminophen  1,000 mg Oral TID  . feeding supplement (ENSURE ENLIVE)  237 mL Oral TID BM  . fulvestrant  500 mg Intramuscular Once  . heparin  5,000 Units Subcutaneous Q8H  . losartan  100 mg Oral Daily  . polyethylene glycol  17 g Oral QODAY  . potassium chloride  40 mEq Oral Once  . rosuvastatin  10 mg Oral Daily  . vitamin B-12  1,000 mcg Oral Daily   Continuous Infusions: . sodium chloride 75 mL/hr at 05/16/17 1917  . cefTRIAXone (ROCEPHIN)  IV Stopped  (05/16/17 2016)     LOS: 3 days    Time spent: 25 minutes  Greater than 50% of the time spent on counseling and coordinating the care.   Leisa Lenz, MD [facility] Pager [phone]  If 7PM-7AM, please contact night-coverage [email or web address] Password [email or web address]  05/17/2017, 9:53 AM

## 2017-05-18 LAB — CBC
HCT: 36.3 % (ref 36.0–46.0)
Hemoglobin: 11.5 g/dL — ABNORMAL LOW (ref 12.0–15.0)
MCH: 29.6 pg (ref 26.0–34.0)
MCHC: 31.7 g/dL (ref 30.0–36.0)
MCV: 93.6 fL (ref 78.0–100.0)
PLATELETS: 216 10*3/uL (ref 150–400)
RBC: 3.88 MIL/uL (ref 3.87–5.11)
RDW: 14.7 % (ref 11.5–15.5)
WBC: 19.7 10*3/uL — AB (ref 4.0–10.5)

## 2017-05-18 LAB — BASIC METABOLIC PANEL
ANION GAP: 11 (ref 5–15)
BUN: 32 mg/dL — ABNORMAL HIGH (ref 6–20)
CALCIUM: 11.3 mg/dL — AB (ref 8.9–10.3)
CO2: 19 mmol/L — AB (ref 22–32)
CREATININE: 1.72 mg/dL — AB (ref 0.44–1.00)
Chloride: 122 mmol/L — ABNORMAL HIGH (ref 101–111)
GFR calc non Af Amer: 25 mL/min — ABNORMAL LOW (ref >60)
GFR, EST AFRICAN AMERICAN: 29 mL/min — AB (ref >60)
Glucose, Bld: 91 mg/dL (ref 65–99)
Potassium: 3.9 mmol/L (ref 3.5–5.1)
Sodium: 152 mmol/L — ABNORMAL HIGH (ref 135–145)

## 2017-05-18 NOTE — Progress Notes (Signed)
Patient ID: Samantha Abbott, female   DOB: 12-05-1925, 83 y.o.   MRN: [id number]  PROGRESS NOTE    Samantha Abbott  AJG:[id number] DOB: 1925/02/09 DOA: 05/14/2017  PCP: Benito Mccreedy, MD   Brief Narrative:  83 year old female with past medical history of invasive ductal breast cancer (diagnosed 03/2017), hypertension, GERD who presented to ED with worsening weakness over past 4-5 days PTA>Pt is not able to walk and her appetite has significantly decreased.    Assessment & Plan:   Principal Problem:   Sepsis secondary to UTI ([place]) / Leukocytosis  - Sepsis criteria met on the admission with tachycardia, tachypnea, leukocytosis - Likely source of infection is UTI - Continue rocephin   Active Problems:   Carcinoma of upper-outer quadrant of left breast in female, estrogen receptor positive ([place]) / metastasis to lung, bone, liver and in the left adrenal gland - Oncology has seen the patient in consultation, per oncology, cancer has spread to bone, lung, liver and left adrenal gland. Cancer is not curable and treatment is palliative.  - Fulvestrant ordered 5/26 - Appreciate palliative following for goals of care     Anemia of chronic disease - Due to malignancy as well as chronic kidney disease - Hgb stable     Elevated troponin - Likely demand ischemia from sepsis   - No chest pain    Hypercalcemia - Secondary to dehydration versus hypercalcemia of malignancy - Pamidronate ordered 5/26 - Calcium 11.3 this am    Essential hypertension - Continue cozaar     Acute kidney injury superimposed on chronic kidney disease stage 4 (HCC) / metabolic acidosis - Cr stable at 1.7    Moderate protein calorie malnutrition - In the context of chronic illness - SLP eval pending    DVT prophylaxis: Hep subQ Code Status: DNR/DNI Family Communication: no family at the bedside this am Disposition Plan: SNF versus home on Monday or Tuesday    Consultants:    PT  Palliative care   Procedures:   None   Antimicrobials:   Rocephin 05/14/2017 -->     Subjective: No overnight events.   Objective: Vitals:   05/17/17 1348 05/17/17 2056 05/17/17 2308 05/18/17 0505  BP: (!) 197/98 (!) 193/85 (!) 141/75 (!) 150/98  Pulse: 74 85  71  Resp: 16 14  14   Temp: 98.1 F (36.7 C) 97.9 F (36.6 C)  97.7 F (36.5 C)  TempSrc: Oral Oral  Oral  SpO2: 100% 100%  100%  Weight:      Height:        Intake/Output Summary (Last 24 hours) at 05/18/17 1142 Last data filed at 05/17/17 1500  Gross per 24 hour  Intake          1264.17 ml  Output                0 ml  Net          1264.17 ml   Filed Weights   05/14/17 1632 05/14/17 2149  Weight: 65.8 kg (145 lb) 63.3 kg (139 lb 9.6 oz)    Examination:  Physical Exam  Constitutional: Appears well-developed and well-nourished. No distress.  CVS: RRR, S1/S2 (+) Pulmonary: Effort and breath sounds normal, no stridor, rhonchi, wheezes, rales.  Abdominal: Soft. BS +,  no distension, tenderness, rebound or guarding.  Musculoskeletal: Normal range of motion. No edema and no tenderness.  Neuro: Alert. Normal reflexes Skin: Skin is warm and dry.  Psychiatric: Normal mood and affect.  Data Reviewed: I have personally reviewed following labs and imaging studies  CBC:  Recent Labs Lab 05/14/17 1755 05/15/17 0350 05/16/17 0335 05/17/17 0719 05/18/17 0400  WBC 16.8* 15.8* 16.3* 17.1* 19.7*  NEUTROABS 14.1*  --   --   --   --   HGB 12.2 10.8* 11.0* 11.7* 11.5*  HCT 38.0 34.2* 34.6* 37.1 36.3  MCV 93.8 93.4 93.8 93.5 93.6  PLT 255 252 245 262 409   Basic Metabolic Panel:  Recent Labs Lab 05/14/17 1755 05/15/17 0350 05/16/17 0335 05/17/17 0719 05/18/17 0400  NA 143 145 148* 152* 152*  K 3.7 3.8 3.6 3.2* 3.9  CL 108 112* 116* 119* 122*  CO2 19* 19* 21* 24 19*  GLUCOSE 85 79 90 108* 91  BUN 50* 50* 44* 36* 32*  CREATININE 2.80* 2.61* 2.26* 1.78* 1.72*  CALCIUM 12.0* 12.0*  11.7* 12.0* 11.3*   GFR: Estimated Creatinine Clearance: 18.9 mL/min (A) (by C-G formula based on SCr of 1.72 mg/dL (H)). Liver Function Tests:  Recent Labs Lab 05/14/17 1755  AST 37  ALT 14  ALKPHOS 146*  BILITOT 1.2  PROT 7.7  ALBUMIN 3.1*   No results for input(s): LIPASE, AMYLASE in the last 168 hours. No results for input(s): AMMONIA in the last 168 hours. Coagulation Profile: No results for input(s): INR, PROTIME in the last 168 hours. Cardiac Enzymes:  Recent Labs Lab 05/14/17 1755 05/15/17 0350  TROPONINI 0.07* 0.08*   BNP (last 3 results) No results for input(s): PROBNP in the last 8760 hours. HbA1C: No results for input(s): HGBA1C in the last 72 hours. CBG: No results for input(s): GLUCAP in the last 168 hours. Lipid Profile: No results for input(s): CHOL, HDL, LDLCALC, TRIG, CHOLHDL, LDLDIRECT in the last 72 hours. Thyroid Function Tests: No results for input(s): TSH, T4TOTAL, FREET4, T3FREE, THYROIDAB in the last 72 hours. Anemia Panel: No results for input(s): VITAMINB12, FOLATE, FERRITIN, TIBC, IRON, RETICCTPCT in the last 72 hours. Urine analysis:    Component Value Date/Time   COLORURINE YELLOW 05/14/2017 1641   APPEARANCEUR HAZY (A) 05/14/2017 1641   LABSPEC 1.014 05/14/2017 1641   PHURINE 5.0 05/14/2017 1641   GLUCOSEU NEGATIVE 05/14/2017 1641   HGBUR MODERATE (A) 05/14/2017 1641   BILIRUBINUR NEGATIVE 05/14/2017 1641   KETONESUR 5 (A) 05/14/2017 1641   PROTEINUR 30 (A) 05/14/2017 1641   UROBILINOGEN 1 04/16/2012 1205   NITRITE NEGATIVE 05/14/2017 1641   LEUKOCYTESUR SMALL (A) 05/14/2017 1641   Sepsis Labs: @LABRCNTIP (procalcitonin:4,lacticidven:4)   ) Recent Results (from the past 240 hour(s))  Urine culture     Status: Abnormal   Collection Time: 05/14/17  4:41 PM  Result Value Ref Range Status   Specimen Description URINE, CLEAN CATCH  Final   Special Requests NONE  Final   Culture (A)  Final    >=100,000 COLONIES/mL  LACTOBACILLUS SPECIES Standardized susceptibility testing for this organism is not available. Performed at [facility], [place] [address] [place], [place] [address]    Report Status 05/16/2017 FINAL  Final  Blood culture (routine x 2)     Status: None (Preliminary result)   Collection Time: 05/14/17  9:13 PM  Result Value Ref Range Status   Specimen Description BLOOD BLOOD LEFT HAND  Final   Special Requests IN PEDIATRIC BOTTLE Blood Culture adequate volume  Final   Culture   Final    NO GROWTH 2 DAYS Performed at [facility], [place] [address], [place], [place] [address]  Report Status PENDING  Incomplete  Blood culture (routine x 2)     Status: None (Preliminary result)   Collection Time: 05/14/17 10:20 PM  Result Value Ref Range Status   Specimen Description BLOOD RIGHT ARM  Final   Special Requests IN PEDIATRIC BOTTLE Blood Culture adequate volume  Final   Culture   Final    NO GROWTH 2 DAYS Performed at [facility], [place] [address] [place], [place] [address]    Report Status PENDING  Incomplete      Radiology Studies: Ct Head Wo Contrast  Result Date: 05/14/2017 CLINICAL DATA:  Increasing weakness for several days. Altered mental status. Recent diagnosis of bilateral breast cancer. History of hypertension. EXAM: CT HEAD WITHOUT CONTRAST TECHNIQUE: Contiguous axial images were obtained from the base of the skull through the vertex without intravenous contrast. COMPARISON:  05/06/2017 FINDINGS: Brain: Diffuse cerebral atrophy. Ventricular dilatation consistent with central atrophy. Low-attenuation changes in the deep white matter consistent with small vessel ischemia. No mass effect or midline shift. No abnormal extra-axial fluid collections. Gray-white matter junctions are distinct. Basal cisterns are not effaced. No acute intracranial hemorrhage. Vascular: Vascular calcifications are present. Skull: Circumscribed lucent lesions in the calvarium consistent with  known metastatic disease. No depressed skull fractures. Sinuses/Orbits: Paranasal sinuses and mastoid air cells are clear. Other: No significant changes since previous study. IMPRESSION: No acute intracranial abnormalities. Chronic atrophy and small vessel ischemic changes. Skull metastases again demonstrated. Electronically Signed   By: Lucienne Capers M.D.   On: 05/14/2017 23:45   Dg Chest Port 1 View  Result Date: 05/14/2017 CLINICAL DATA:  Arneta Cliche SINCE Friday. PER EMS, THE PT HAS A HIS OF BILATERAL BREAST CA, BUT NOT ON ANY TREATMENT YET. PER THE SON, THE PT HAS ELECTED TO HAVE SURGERY, BUT THEY ARE WAITING FOR THE ORDERS. THE PT HAS NOT BEEN EATING, DRINKING, AND INCONTINENT OF URINE AND STOOL. PT AAOX4, BUT SLOW TO RESPOND. PT STS, "ALL OF MY ENERGY IS GONE." EXAM: PORTABLE CHEST - 1 VIEW COMPARISON:  CT 05/06/2017 and previous FINDINGS: 12 mm nodule, left lower lobe. Right lung clear. Atheromatous aorta. Mild cardiomegaly. No effusion.  No pneumothorax. Anterior vertebral endplate spurring at multiple levels in the mid and lower thoracic spine. Mild degenerative change in the right shoulder. IMPRESSION: 1. No acute findings. 2. 12 mm left lower lung pulmonary nodule, new since 11/29/2011, likely corresponding to the metastatic disease seen on recent CT chest. Electronically Signed   By: Lucrezia Europe M.D.   On: 05/14/2017 17:13       Scheduled Meds: . acetaminophen  1,000 mg Oral TID  . feeding supplement (ENSURE ENLIVE)  237 mL Oral TID BM  . heparin  5,000 Units Subcutaneous Q8H  . losartan  100 mg Oral Daily  . polyethylene glycol  17 g Oral QODAY  . rosuvastatin  10 mg Oral Daily  . vitamin B-12  1,000 mcg Oral Daily   Continuous Infusions: . sodium chloride 75 mL/hr at 05/18/17 0408  . cefTRIAXone (ROCEPHIN)  IV Stopped (05/17/17 2050)     LOS: 4 days    Time spent: 15 minutes  Greater than 50% of the time spent on counseling and coordinating the care.   Leisa Lenz,  MD [facility] Pager [phone]  If 7PM-7AM, please contact night-coverage [email or web address] Password [facility] 05/18/2017, 11:42 AM

## 2017-05-18 NOTE — Evaluation (Addendum)
Clinical/Bedside Swallow Evaluation Patient Details  Name: Samantha Abbott MRN: [id number] Date of Birth: 08-16-1925  Today's Date: 05/18/2017 Time: SLP Start Time (ACUTE ONLY): 1535 SLP Stop Time (ACUTE ONLY): 1550 SLP Time Calculation (min) (ACUTE ONLY): 15 min  Past Medical History:  Past Medical History:  Diagnosis Date  . Abdominal pain   . Abnormal heart rhythm   . Arthritis   . Breast cancer ([place])    BILATERAL  . Dysrhythmia   . Esophageal ulcer   . Flatulence, eructation, and gas pain   . GERD (gastroesophageal reflux disease)    with esophageal ulcer in past  . Hypertension    LOV WITH EKG DR [facility] RECEIVED 12/02/11 1530 and placed on chart  . Iron deficiency anemia   . Myocardial infarction [facility])    silent MI 1961/ states anterior wall  . Nausea   . Sinus problem   . Vomiting   . Wears glasses    Past Surgical History:  Past Surgical History:  Procedure Laterality Date  . ABDOMINAL HYSTERECTOMY    . BOWEL RESECTION  03/24/2012   Procedure: SMALL BOWEL RESECTION;  Surgeon: Pedro Earls, MD;  Location: [facility] ORS;  Service: General;  Laterality: N/A;  . BREAST EXCISIONAL BIOPSY Left   . BREAST EXCISIONAL BIOPSY Right   . CHOLECYSTECTOMY    . COLON SURGERY  12/06/11   lap sigmoid colectomy   . COLONOSCOPY    . FOOT SURGERY     HPI:   recently diagnosed invasive ductalbreast cancer on 03/2017, dysrhythmia, HTN, and GERD; who presents with generalized weakness over the last 4-5 days. Patient's son provides history as patient is unable to at this time. He notes that she has rapidly deteriorated since onset of symptoms. She is not able to walk  and will not eat or drink anything. Associated symptoms include lethargy, mild confusion, nausea, and mild nonproductive cough. Patient has not been having any fever, chills, chest pain, abdominal pain, vomiting, or diarrhea. Family is in significant need of obtaining a hospital bed to help care for her at home. She is  followed by Dr. Gwenlyn Perking of oncology, and recently decided to go through with the surgery to have the cancer removed.  Pt found to have UTI/ elevated WBC and leukocytosis and met sepsis criteria. CXR showed 12 mm LLL pulmonary nodule likely corresponding to metastatic disease. Head CT showed skull metastases. Palliative consulted. Bedside swallow eval ordered to determine safest diet.    Assessment / Plan / Recommendation Clinical Impression  Pt consumed thin liquid trials by straw without overt s/s of aspiration. Pt declined trials of puree/ solid consistencies despite encouragement/ offered several times. RN and NT report that pt has been declining nearly all POs. RN reports meds had been provided crushed in puree and pt had held material in oral cavity without swallowing. Pt currently confused with speech very difficult to understand. Oriented to self but unable to respond to other orientation questions. Pt may be more likely to consume POs with a full liquid diet. Recommend downgrading to this with meds either whole with liquid or crushed with puree, full supervision to assist with feeding providing small bites at a time. If pt continues to hold meds, may need to provide via alternative means. Would also be beneficial to have suction available at bedside for oral holding. Will continue to follow for diet tolerance/ consider advancement. SLP Visit Diagnosis: Dysphagia, unspecified (R13.10)    Aspiration Risk  Mild aspiration risk  Diet Recommendation Thin liquid (full liquid)   Liquid Administration via: Cup;Straw Medication Administration: Whole meds with liquid (or crushed in puree) Supervision: Staff to assist with self feeding;Full supervision/cueing for compensatory strategies Compensations: Slow rate;Small sips/bites Postural Changes: Seated upright at 90 degrees    Other  Recommendations Oral Care Recommendations: Oral care BID   Follow up Recommendations Other (comment) (TBD)       Frequency and Duration min 2x/week  1 week       Prognosis Prognosis for Safe Diet Advancement: Fair Barriers to Reach Goals: Cognitive deficits      Swallow Study   General HPI:  recently diagnosed invasive ductalbreast cancer on 03/2017, dysrhythmia, HTN, and GERD; who presents with generalized weakness over the last 4-5 days. Patient's son provides history as patient is unable to at this time. He notes that she has rapidly deteriorated since onset of symptoms. She is not able to walk  and will not eat or drink anything. Associated symptoms include lethargy, mild confusion, nausea, and mild nonproductive cough. Patient has not been having any fever, chills, chest pain, abdominal pain, vomiting, or diarrhea. Family is in significant need of obtaining a hospital bed to help care for her at home. She is followed by Dr. Gwenlyn Perking of oncology, and recently decided to go through with the surgery to have the cancer removed.  Pt found to have UTI/ elevated WBC and leukocytosis and met sepsis criteria. CXR showed 12 mm LLL pulmonary nodule likely corresponding to metastatic disease. Head CT showed skull metastases. Palliative consulted. Bedside swallow eval ordered to determine safest diet.  Type of Study: Bedside Swallow Evaluation Previous Swallow Assessment:  (none in chart) Diet Prior to this Study: Dysphagia 3 (soft);Thin liquids Temperature Spikes Noted: No Respiratory Status: Room air History of Recent Intubation: No Behavior/Cognition: Alert;Confused;Requires cueing Oral Cavity Assessment: Dry Oral Cavity - Dentition: Poor condition;Missing dentition Self-Feeding Abilities: Needs assist Patient Positioning: Upright in bed Baseline Vocal Quality: Low vocal intensity Volitional Cough: Cognitively unable to elicit Volitional Swallow: Unable to elicit    Oral/Motor/Sensory Function Overall Oral Motor/Sensory Function: Other (comment) (difficult to assess)   Ice Chips Ice chips: Not tested    Thin Liquid Thin Liquid: Within functional limits Presentation: Straw    Nectar Thick Nectar Thick Liquid: Not tested   Honey Thick Honey Thick Liquid: Not tested   Puree Puree: Not tested (refused)   Solid   GO   Solid: Not tested (refused)        Kern Reap, MA, CCC-SLP 05/18/2017,4:00 PM [phone]

## 2017-05-19 ENCOUNTER — Inpatient Hospital Stay (HOSPITAL_COMMUNITY): Payer: Medicare Other

## 2017-05-19 DIAGNOSIS — T17908A Unspecified foreign body in respiratory tract, part unspecified causing other injury, initial encounter: Secondary | ICD-10-CM

## 2017-05-19 DIAGNOSIS — E870 Hyperosmolality and hypernatremia: Secondary | ICD-10-CM

## 2017-05-19 LAB — CBC
HEMATOCRIT: 39.3 % (ref 36.0–46.0)
HEMOGLOBIN: 12.5 g/dL (ref 12.0–15.0)
MCH: 30.2 pg (ref 26.0–34.0)
MCHC: 31.8 g/dL (ref 30.0–36.0)
MCV: 94.9 fL (ref 78.0–100.0)
Platelets: 159 10*3/uL (ref 150–400)
RBC: 4.14 MIL/uL (ref 3.87–5.11)
RDW: 15.4 % (ref 11.5–15.5)
WBC: 22.2 10*3/uL — ABNORMAL HIGH (ref 4.0–10.5)

## 2017-05-19 LAB — BASIC METABOLIC PANEL
Anion gap: 14 (ref 5–15)
BUN: 29 mg/dL — ABNORMAL HIGH (ref 6–20)
CHLORIDE: 121 mmol/L — AB (ref 101–111)
CO2: 17 mmol/L — AB (ref 22–32)
Calcium: 10.6 mg/dL — ABNORMAL HIGH (ref 8.9–10.3)
Creatinine, Ser: 1.69 mg/dL — ABNORMAL HIGH (ref 0.44–1.00)
GFR calc Af Amer: 30 mL/min — ABNORMAL LOW (ref >60)
GFR calc non Af Amer: 26 mL/min — ABNORMAL LOW (ref >60)
Glucose, Bld: 74 mg/dL (ref 65–99)
Potassium: 5.1 mmol/L (ref 3.5–5.1)
SODIUM: 152 mmol/L — AB (ref 135–145)

## 2017-05-19 MED ORDER — PIPERACILLIN-TAZOBACTAM IN DEX 2-0.25 GM/50ML IV SOLN
2.2500 g | Freq: Four times a day (QID) | INTRAVENOUS | Status: DC
  Administered 2017-05-19 – 2017-05-23 (×16): 2.25 g via INTRAVENOUS
  Filled 2017-05-19 (×18): qty 50

## 2017-05-19 MED ORDER — METOPROLOL TARTRATE 5 MG/5ML IV SOLN
2.5000 mg | Freq: Two times a day (BID) | INTRAVENOUS | Status: DC
  Administered 2017-05-19 – 2017-05-20 (×4): 2.5 mg via INTRAVENOUS
  Administered 2017-05-21: 205 mg via INTRAVENOUS
  Administered 2017-05-21 – 2017-05-23 (×4): 2.5 mg via INTRAVENOUS
  Filled 2017-05-19 (×9): qty 5

## 2017-05-19 MED ORDER — HYDRALAZINE HCL 20 MG/ML IJ SOLN
10.0000 mg | Freq: Three times a day (TID) | INTRAMUSCULAR | Status: DC
  Administered 2017-05-19 – 2017-05-23 (×10): 10 mg via INTRAVENOUS
  Filled 2017-05-19 (×12): qty 1

## 2017-05-19 NOTE — Progress Notes (Signed)
Physical Therapy Treatment Patient Details Name: Samantha Abbott MRN: [id number] DOB: December 30, 1924 Today's Date: 05/19/2017    History of Present Illness 83 yo female admitted with sepsis, weakness. Hx of recent breast cancer dx, HTN    PT Comments    Pt appears lethargic. Very little verbalization during session. No family present. Per chart, family is declining placement.    Follow Up Recommendations  SNF     Equipment Recommendations  Hospital bed;Wheelchair ;3in1    Recommendations for Other Services       Precautions / Restrictions Precautions Precautions: Fall Restrictions Weight Bearing Restrictions: No    Mobility  Bed Mobility Overal bed mobility: Needs Assistance Bed Mobility: Supine to Sit;Sit to Supine     Supine to sit: Max assist;+2 for physical assistance;+2 for safety/equipment;HOB elevated Sit to supine: Mod assist;+2 for physical assistance;+2 for safety/equipment;HOB elevated   General bed mobility comments: Assist for trunk and bil LEs. Multimodal cues for safety, technique, self assist. Utilized bedpad for scooting, positioning. Increased time.  Transfers Overall transfer level: Needs assistance Equipment used: Rolling walker (2 wheeled) Transfers: Sit to/from Stand Sit to Stand: Mod assist;+2 physical assistance;+2 safety/equipment;From elevated surface         General transfer comment: Assist to rise, stabilize, control descent.  Sit to stand x 1. Pt was able to stand for ~10-15 seconds before sitting abruptly. Very weak.   Ambulation/Gait                 Stairs            Wheelchair Mobility    Modified Rankin (Stroke Patients Only)       Balance           Standing balance support: Bilateral upper extremity supported Standing balance-Leahy Scale: Poor                              Cognition Arousal/Alertness: Lethargic Behavior During Therapy: Flat affect Overall Cognitive Status: Difficult to  assess                                        Exercises      General Comments        Pertinent Vitals/Pain Pain Assessment: Faces Faces Pain Scale: No hurt    Home Living                      Prior Function            PT Goals (current goals can now be found in the care plan section) Progress towards PT goals: Not progressing toward goals - comment (level of alertness and level of assistance ~ the same. )    Frequency    Min 3X/week      PT Plan Current plan remains appropriate (however family is declining SNF placement)    Co-evaluation              AM-PAC PT "6 Clicks" Daily Activity  Outcome Measure  Difficulty turning over in bed (including adjusting bedclothes, sheets and blankets)?: A Lot Difficulty moving from lying on back to sitting on the side of the bed? : A Lot Difficulty sitting down on and standing up from a chair with arms (e.g., wheelchair, bedside commode, etc,.)?: A Lot Help needed moving to and from a bed to chair (  including a wheelchair)?: A Lot Help needed walking in hospital room?: Total Help needed climbing 3-5 steps with a railing? : Total 6 Click Score: 10    End of Session Equipment Utilized During Treatment: Gait belt Activity Tolerance: Patient limited by fatigue Patient left: in bed;with call bell/phone within reach;with bed alarm set   PT Visit Diagnosis: Muscle weakness (generalized) (M62.81);Difficulty in walking, not elsewhere classified (R26.2)     Time: 1146-4314 PT Time Calculation (min) (ACUTE ONLY): 12 min  Charges:  $Therapeutic Activity: 8-22 mins                    G Codes:         Weston Anna, MPT Pager: [phone]

## 2017-05-19 NOTE — Progress Notes (Addendum)
Patient ID: Samantha Abbott, female   DOB: [birth year], 83 y.o.   MRN: [id number]  PROGRESS NOTE    Samantha Abbott  ZJI:[id number] DOB: 1925-05-14 DOA: 05/14/2017  PCP: Benito Mccreedy, MD   Brief Narrative:  83 year old female with past medical history of invasive ductal breast cancer (diagnosed 03/2017), hypertension, GERD who presented to ED with worsening weakness over past 4-5 days PTA. Pt was subsequently found to have UTI. She also has difficulty swallowing and is at risk of aspiration. SLP consulted.   Assessment & Plan:   Principal Problem: Sepsis secondary to Lactobacillus UTI ([name]) / Leukocytosis  - Sepsis criteria met on the admission with tachycardia, tachypnea, leukocytosis - Urine cx grew Lactobacillus species - Blood cx negative so far  - Will stop rocephin today and switch to zosyn to cover for possible aspiration pneumonia   Active Problems:   Aspiration pneumonitis / Leukocytosis  - SLP eval pending - Obtain CXR this am - Stop rocephin and use zosyn starting today     Hypernatremia - Due to poor po intake, dehydration - Continue IV fluids     Hypokalemia - Repeat potassium WNL  Carcinoma of upper-outer quadrant of left breast in female, estrogen receptor positive ([name]) / metastasis to lung, bone, liver and in the left adrenal gland - Oncology has seen the patient in consultation, per oncology, cancer has spread to bone, lung, liver and left adrenal gland. Cancer is not curable and treatment is palliative.  - Fulvestrant ordered 5/26 - Palliative care team is following   Anemia of chronic disease - Due to malignancy as well as chronic kidney disease - Hgb stable at 12.5  Elevated troponin - Likely demand ischemia from sepsis  - No chest pain  Hypercalcemia - Secondary to dehydration versus hypercalcemia of malignancy - Pamidronate ordered 5/26 - Calcium 10.6 today   Essential hypertension - Stop cozaar and use  hydralazine and metoprolol for blood pressure control   Acute kidney injury superimposed on chronic kidney disease stage 4(HCC) / metabolic acidosis - Cr stable at 1.69    Moderate protein calorie malnutrition - In the context of chronic illness - CXR pending - SLP pending    DVT prophylaxis:subQ heparin Code Status:DNR/DNI Family Communication:no family at the bedside this am Disposition Plan:home or SNF once stable    Consultants:  PT  Palliative care   Procedures:  None   Antimicrobials:   Rocephin 05/14/2017 -->05/19/2017  Zosyn 05/19/2017 -->    Subjective: Not able to swallow, not eating much.  Objective: Vitals:   05/18/17 0505 05/18/17 1328 05/18/17 2106 05/19/17 0522  BP: (!) 150/98 (!) 160/85 (!) 184/82 (!) 188/77  Pulse: 71 91 91 89  Resp: 14 16 18 20   Temp: 97.7 F (36.5 C) 98.2 F (36.8 C) 98.2 F (36.8 C) 97.6 F (36.4 C)  TempSrc: Oral Axillary Axillary Axillary  SpO2: 100% 96% 93% 97%  Weight:      Height:        Intake/Output Summary (Last 24 hours) at 05/19/17 1120 Last data filed at 05/19/17 0524  Gross per 24 hour  Intake                0 ml  Output              200 ml  Net             -200 ml   Filed Weights   05/14/17 1632 05/14/17 2149  Weight: 65.8 kg (  145 lb) 63.3 kg (139 lb 9.6 oz)    Examination:  General exam: Appears calm and comfortable  Respiratory system: diminished, no wheezing  Cardiovascular system: S1 & S2 heard, Rate controlled  Gastrointestinal system: Abdomen is nondistended, soft and nontender. No organomegaly or masses felt. Normal bowel sounds heard. Central nervous system: pt has dementia, she is not oriented but she is alert Extremities: Symmetric 5 x 5 power. Skin: No rashes, lesions or ulcers Psychiatry: No agitation or restlessness   Data Reviewed: I have personally reviewed following labs and imaging studies  CBC:  Recent Labs Lab 05/14/17 1755 05/15/17 0350  05/16/17 0335 05/17/17 0719 05/18/17 0400 05/19/17 0353  WBC 16.8* 15.8* 16.3* 17.1* 19.7* 22.2*  NEUTROABS 14.1*  --   --   --   --   --   HGB 12.2 10.8* 11.0* 11.7* 11.5* 12.5  HCT 38.0 34.2* 34.6* 37.1 36.3 39.3  MCV 93.8 93.4 93.8 93.5 93.6 94.9  PLT 255 252 245 262 216 704   Basic Metabolic Panel:  Recent Labs Lab 05/15/17 0350 05/16/17 0335 05/17/17 0719 05/18/17 0400 05/19/17 0353  NA 145 148* 152* 152* 152*  K 3.8 3.6 3.2* 3.9 5.1  CL 112* 116* 119* 122* 121*  CO2 19* 21* 24 19* 17*  GLUCOSE 79 90 108* 91 74  BUN 50* 44* 36* 32* 29*  CREATININE 2.61* 2.26* 1.78* 1.72* 1.69*  CALCIUM 12.0* 11.7* 12.0* 11.3* 10.6*   GFR: Estimated Creatinine Clearance: 19.2 mL/min (A) (by C-G formula based on SCr of 1.69 mg/dL (H)). Liver Function Tests:  Recent Labs Lab 05/14/17 1755  AST 37  ALT 14  ALKPHOS 146*  BILITOT 1.2  PROT 7.7  ALBUMIN 3.1*   No results for input(s): LIPASE, AMYLASE in the last 168 hours. No results for input(s): AMMONIA in the last 168 hours. Coagulation Profile: No results for input(s): INR, PROTIME in the last 168 hours. Cardiac Enzymes:  Recent Labs Lab 05/14/17 1755 05/15/17 0350  TROPONINI 0.07* 0.08*   BNP (last 3 results) No results for input(s): PROBNP in the last 8760 hours. HbA1C: No results for input(s): HGBA1C in the last 72 hours. CBG: No results for input(s): GLUCAP in the last 168 hours. Lipid Profile: No results for input(s): CHOL, HDL, LDLCALC, TRIG, CHOLHDL, LDLDIRECT in the last 72 hours. Thyroid Function Tests: No results for input(s): TSH, T4TOTAL, FREET4, T3FREE, THYROIDAB in the last 72 hours. Anemia Panel: No results for input(s): VITAMINB12, FOLATE, FERRITIN, TIBC, IRON, RETICCTPCT in the last 72 hours. Urine analysis:    Component Value Date/Time   COLORURINE YELLOW 05/14/2017 1641   APPEARANCEUR HAZY (A) 05/14/2017 1641   LABSPEC 1.014 05/14/2017 1641   PHURINE 5.0 05/14/2017 1641   GLUCOSEU  NEGATIVE 05/14/2017 1641   HGBUR MODERATE (A) 05/14/2017 1641   BILIRUBINUR NEGATIVE 05/14/2017 1641   KETONESUR 5 (A) 05/14/2017 1641   PROTEINUR 30 (A) 05/14/2017 1641   UROBILINOGEN 1 04/16/2012 1205   NITRITE NEGATIVE 05/14/2017 1641   LEUKOCYTESUR SMALL (A) 05/14/2017 1641   Sepsis Labs: @LABRCNTIP (procalcitonin:4,lacticidven:4)   Recent Results (from the past 240 hour(s))  Urine culture     Status: Abnormal   Collection Time: 05/14/17  4:41 PM  Result Value Ref Range Status   Specimen Description URINE, CLEAN CATCH  Final   Special Requests NONE  Final   Culture (A)  Final    >=100,000 COLONIES/mL LACTOBACILLUS SPECIES Standardized susceptibility testing for this organism is not available. Performed at [facility],  [address], [place], [place] [address]    Report Status 05/16/2017 FINAL  Final  Blood culture (routine x 2)     Status: None (Preliminary result)   Collection Time: 05/14/17  9:13 PM  Result Value Ref Range Status   Specimen Description BLOOD BLOOD LEFT HAND  Final   Special Requests IN PEDIATRIC BOTTLE Blood Culture adequate volume  Final   Culture   Final    NO GROWTH 4 DAYS Performed at [facility], [place] [address] [place], [place] [address]    Report Status PENDING  Incomplete  Blood culture (routine x 2)     Status: None (Preliminary result)   Collection Time: 05/14/17 10:20 PM  Result Value Ref Range Status   Specimen Description BLOOD RIGHT ARM  Final   Special Requests IN PEDIATRIC BOTTLE Blood Culture adequate volume  Final   Culture   Final    NO GROWTH 4 DAYS Performed at [facility], [place] [address], [place], [place] [address]    Report Status PENDING  Incomplete      Radiology Studies: No results found.   Scheduled Meds: . acetaminophen  1,000 mg Oral TID  . feeding supplement (ENSURE ENLIVE)  237 mL Oral TID BM  . hydrALAZINE  10 mg Intravenous Q8H  . metoprolol tartrate  2.5 mg Intravenous Q12H    Continuous Infusions: . sodium chloride 75 mL/hr at 05/19/17 0830     LOS: 5 days    Time spent: 25 minutes  Greater than 50% of the time spent on counseling and coordinating the care.   Leisa Lenz, MD [facility] Pager [phone]  If 7PM-7AM, please contact night-coverage [email or web address] Password [email or web address] 05/19/2017, 11:20 AM

## 2017-05-19 NOTE — Progress Notes (Signed)
Pharmacy Antibiotic Note  Samantha Abbott is a 83 y.o. female admitted on 05/14/2017 with apsiration pneumonia.  Pharmacy has been consulted for zosyn dosing. Patient receiving ceftriaxone for last 5 days, now orders to change to zosyn for possible aspiration pneumonia, WBC worsening (5/28 CXR revealed no cardiopulmonary disease)  Today, 05/19/2017  Day #6 ceftriaxone  WBC trending up  Afebrile  AKI on CKD - CrCl calculates to be ~58ml/min  Plan: Zosyn 2.25gm IV q6h for CrCl slightly < 20 ml/min  Height: 5\' 1"  (154.9 cm) Weight: 139 lb 9.6 oz (63.3 kg) IBW/kg (Calculated) : 47.8  Temp (24hrs), Avg:98 F (36.7 C), Min:97.6 F (36.4 C), Max:98.2 F (36.8 C)   Recent Labs Lab 05/14/17 2105 05/15/17 0350 05/16/17 0335 05/17/17 0719 05/18/17 0400 05/19/17 0353  WBC  --  15.8* 16.3* 17.1* 19.7* 22.2*  CREATININE  --  2.61* 2.26* 1.78* 1.72* 1.69*  LATICACIDVEN 1.56  --   --   --   --   --     Estimated Creatinine Clearance: 19.2 mL/min (A) (by C-G formula based on SCr of 1.69 mg/dL (H)).    No Known Allergies  Antimicrobials this admission: 5/23 ceftriaxone >> 5/28 5/08 zosyn >>  Dose adjustments this admission:  Microbiology results: 5/23 BCx: NGTD 5/23 UCx: >100K lactobacillus    Thank you for allowing pharmacy to be a part of this patient's care.  Doreene Eland, PharmD, BCPS.   Pager: [phone] 05/19/2017 12:47 PM

## 2017-05-19 NOTE — Progress Notes (Addendum)
  Speech Language Pathology Treatment: Dysphagia  Patient Details Name: Samantha Abbott MRN: [id number] DOB: Sep 28, 1925 Today's Date: 05/19/2017 Time: 2947-6546 SLP Time Calculation (min) (ACUTE ONLY): 24 min  Assessment / Plan / Recommendation Clinical Impression  Pt seen at bedside with friend present. RN tech reported pt became strangled with applesauce and thin liquids via straw earlier today. Explosive cough immediately following thin juice via cup with SLP with throat clears throughout remainder of session and during trials chocolate pudding. SLP thickened liquids but she politely refused stating "I'm done" and I just want a natural cremation." Discussed liquid consistency and explained thickened liquids may diminish her consistent coughing which can be uncomfortable and pt desires to remain on thin. She is on full liquids. SLP recommends continue full liquids. If she continues with explosive hard coughing, re-attempt thick liquids with Dys 1 (puree) texture. ST will sign off. Please re-consult if concerns.     HPI HPI:  recently diagnosed invasive ductalbreast cancer on 03/2017, dysrhythmia, HTN, and GERD; who presents with generalized weakness over the last 4-5 days. Patient's son provides history as patient is unable to at this time. He notes that she has rapidly deteriorated since onset of symptoms. She is not able to walk  and will not eat or drink anything. Associated symptoms include lethargy, mild confusion, nausea, and mild nonproductive cough. Patient has not been having any fever, chills, chest pain, abdominal pain, vomiting, or diarrhea. Family is in significant need of obtaining a hospital bed to help care for her at home. She is followed by Dr. Gwenlyn Perking of oncology, and recently decided to go through with the surgery to have the cancer removed.  Pt found to have UTI/ elevated WBC and leukocytosis and met sepsis criteria. CXR showed 12 mm LLL pulmonary nodule likely corresponding to  metastatic disease. Head CT showed skull metastases. Palliative consulted. Bedside swallow eval ordered to determine safest diet.       SLP Plan  Discharge SLP treatment due to (comment)       Recommendations  Diet recommendations:  (full/thin liquids, can try softer textures if pt desires) Liquids provided via: Cup Medication Administration: Whole meds with liquid Supervision: Patient able to self feed;Full supervision/cueing for compensatory strategies Compensations: Slow rate;Small sips/bites;Clear throat intermittently Postural Changes and/or Swallow Maneuvers: Seated upright 90 degrees                Oral Care Recommendations: Oral care BID Follow up Recommendations: None SLP Visit Diagnosis: Dysphagia, unspecified (R13.10) Plan: Discharge SLP treatment due to (comment)       GO                Houston Siren 05/19/2017, 3:23 PM  Orbie Pyo Colvin Caroli.Ed [employer] [phone]

## 2017-05-20 ENCOUNTER — Ambulatory Visit: Payer: Medicare Other | Admitting: Oncology

## 2017-05-20 DIAGNOSIS — T17908S Unspecified foreign body in respiratory tract, part unspecified causing other injury, sequela: Secondary | ICD-10-CM

## 2017-05-20 DIAGNOSIS — C7951 Secondary malignant neoplasm of bone: Secondary | ICD-10-CM

## 2017-05-20 LAB — CULTURE, BLOOD (ROUTINE X 2)
Culture: NO GROWTH
Culture: NO GROWTH
SPECIAL REQUESTS: ADEQUATE
Special Requests: ADEQUATE

## 2017-05-20 LAB — CBC
HCT: 33.8 % — ABNORMAL LOW (ref 36.0–46.0)
Hemoglobin: 10.7 g/dL — ABNORMAL LOW (ref 12.0–15.0)
MCH: 29.8 pg (ref 26.0–34.0)
MCHC: 31.7 g/dL (ref 30.0–36.0)
MCV: 94.2 fL (ref 78.0–100.0)
PLATELETS: 140 10*3/uL — AB (ref 150–400)
RBC: 3.59 MIL/uL — ABNORMAL LOW (ref 3.87–5.11)
RDW: 15.4 % (ref 11.5–15.5)
WBC: 21.5 10*3/uL — AB (ref 4.0–10.5)

## 2017-05-20 LAB — BASIC METABOLIC PANEL
ANION GAP: 13 (ref 5–15)
BUN: 26 mg/dL — AB (ref 6–20)
CO2: 18 mmol/L — AB (ref 22–32)
Calcium: 9.6 mg/dL (ref 8.9–10.3)
Chloride: 120 mmol/L — ABNORMAL HIGH (ref 101–111)
Creatinine, Ser: 1.7 mg/dL — ABNORMAL HIGH (ref 0.44–1.00)
GFR calc Af Amer: 30 mL/min — ABNORMAL LOW (ref >60)
GFR calc non Af Amer: 26 mL/min — ABNORMAL LOW (ref >60)
GLUCOSE: 79 mg/dL (ref 65–99)
POTASSIUM: 3.3 mmol/L — AB (ref 3.5–5.1)
Sodium: 151 mmol/L — ABNORMAL HIGH (ref 135–145)

## 2017-05-20 LAB — GLUCOSE, CAPILLARY
Glucose-Capillary: 106 mg/dL — ABNORMAL HIGH (ref 65–99)
Glucose-Capillary: 80 mg/dL (ref 65–99)

## 2017-05-20 MED ORDER — POTASSIUM CHLORIDE 10 MEQ/100ML IV SOLN
10.0000 meq | INTRAVENOUS | Status: AC
  Administered 2017-05-20 (×2): 10 meq via INTRAVENOUS
  Filled 2017-05-20 (×3): qty 100

## 2017-05-20 MED ORDER — ANASTROZOLE 1 MG PO TABS
1.0000 mg | ORAL_TABLET | Freq: Every day | ORAL | Status: DC
  Administered 2017-05-21 – 2017-05-23 (×3): 1 mg via ORAL
  Filled 2017-05-20 (×3): qty 1

## 2017-05-20 MED ORDER — MORPHINE SULFATE (PF) 10 MG/ML IV SOLN: 2.0000 mg | INTRAVENOUS | Status: DC | PRN

## 2017-05-20 MED ORDER — BISACODYL 5 MG PO TBEC
5.0000 mg | DELAYED_RELEASE_TABLET | Freq: Every day | ORAL | Status: DC
  Filled 2017-05-20: qty 1

## 2017-05-20 NOTE — Progress Notes (Signed)
Samantha Abbott   DOB:1925/08/23   PJ#:[id number]   BHA#:[id number]  Subjective: the patient had an appointment with me scheduled for today, but given the hospitalization the family requested I come by to discuss results of her staging studies and overall plan; unfortunatley by the time I came over the family had already left; the patient is unable to give me a current ROS   Objective: elderly African American woman examined in bed Vitals:   05/20/17 0551 05/20/17 1500  BP: 126/74 130/84  Pulse:  78  Resp:  18  Temp:  98 F (36.7 C)    Body mass index is 26.38 kg/m.  Intake/Output Summary (Last 24 hours) at 05/20/17 1705 Last data filed at 05/20/17 1529  Gross per 24 hour  Intake              170 ml  Output                0 ml  Net              170 ml     No cervical or supraclavicular adenopathy  Lungs no rales or wheezes--auscultated anterolaterally  Heart regular rate and rhythm  Abdomen soft, +BS  Neuro appears nonfocal; opens eyes and mumbles to touch but does not follow commands or make coherent speech  Breast exam: deferred  CBG (last 3)  No results for input(s): GLUCAP in the last 72 hours.   Labs:  Lab Results  Component Value Date   WBC 21.5 (H) 05/20/2017   HGB 10.7 (L) 05/20/2017   HCT 33.8 (L) 05/20/2017   MCV 94.2 05/20/2017   PLT 140 (L) 05/20/2017   NEUTROABS 14.1 (H) 05/14/2017    _0 @  Urine Studies No results for input(s): UHGB, CRYS in the last 72 hours.  Invalid input(s): UACOL, UAPR, USPG, UPH, UTP, UGL, UKET, UBIL, UNIT, UROB, [place], UEPI, UWBC, [place], [place], [place], [place], [place]  Basic Metabolic Panel:  Recent Labs Lab 05/16/17 0335 05/17/17 0719 05/18/17 0400 05/19/17 0353 05/20/17 0535  NA 148* 152* 152* 152* 151*  K 3.6 3.2* 3.9 5.1 3.3*  CL 116* 119* 122* 121* 120*  CO2 21* 24 19* 17* 18*  GLUCOSE 90 108* 91 74 79  BUN 44* 36* 32* 29* 26*  CREATININE 2.26* 1.78* 1.72* 1.69* 1.70*  CALCIUM 11.7* 12.0* 11.3* 10.6*  9.6   GFR Estimated Creatinine Clearance: 19.1 mL/min (A) (by C-G formula based on SCr of 1.7 mg/dL (H)). Liver Function Tests:  Recent Labs Lab 05/14/17 1755  AST 37  ALT 14  ALKPHOS 146*  BILITOT 1.2  PROT 7.7  ALBUMIN 3.1*   No results for input(s): LIPASE, AMYLASE in the last 168 hours. No results for input(s): AMMONIA in the last 168 hours. Coagulation profile No results for input(s): INR, PROTIME in the last 168 hours.  CBC:  Recent Labs Lab 05/14/17 1755  05/16/17 0335 05/17/17 0719 05/18/17 0400 05/19/17 0353 05/20/17 0535  WBC 16.8*  < > 16.3* 17.1* 19.7* 22.2* 21.5*  NEUTROABS 14.1*  --   --   --   --   --   --   HGB 12.2  < > 11.0* 11.7* 11.5* 12.5 10.7*  HCT 38.0  < > 34.6* 37.1 36.3 39.3 33.8*  MCV 93.8  < > 93.8 93.5 93.6 94.9 94.2  PLT 255  < > 245 262 216 159 140*  < > = values in this interval not displayed. Cardiac Enzymes:  Recent Labs Lab  05/14/17 1755 05/15/17 0350  TROPONINI 0.07* 0.08*   BNP: Invalid input(s): POCBNP CBG: No results for input(s): GLUCAP in the last 168 hours. D-Dimer No results for input(s): DDIMER in the last 72 hours. Hgb A1c No results for input(s): HGBA1C in the last 72 hours. Lipid Profile No results for input(s): CHOL, HDL, LDLCALC, TRIG, CHOLHDL, LDLDIRECT in the last 72 hours. Thyroid function studies No results for input(s): TSH, T4TOTAL, T3FREE, THYROIDAB in the last 72 hours.  Invalid input(s): FREET3 Anemia work up No results for input(s): VITAMINB12, FOLATE, FERRITIN, TIBC, IRON, RETICCTPCT in the last 72 hours. Microbiology Recent Results (from the past 240 hour(s))  Urine culture     Status: Abnormal   Collection Time: 05/14/17  4:41 PM  Result Value Ref Range Status   Specimen Description URINE, CLEAN CATCH  Final   Special Requests NONE  Final   Culture (A)  Final    >=100,000 COLONIES/mL LACTOBACILLUS SPECIES Standardized susceptibility testing for this organism is not  available. Performed at [facility], [place] [address], [place], [place] [address]    Report Status 05/16/2017 FINAL  Final  Blood culture (routine x 2)     Status: None   Collection Time: 05/14/17  9:13 PM  Result Value Ref Range Status   Specimen Description BLOOD BLOOD LEFT HAND  Final   Special Requests IN PEDIATRIC BOTTLE Blood Culture adequate volume  Final   Culture   Final    NO GROWTH 5 DAYS Performed at [facility], [place] [address] [place], [place] [address]    Report Status 05/20/2017 FINAL  Final  Blood culture (routine x 2)     Status: None   Collection Time: 05/14/17 10:20 PM  Result Value Ref Range Status   Specimen Description BLOOD RIGHT ARM  Final   Special Requests IN PEDIATRIC BOTTLE Blood Culture adequate volume  Final   Culture   Final    NO GROWTH 5 DAYS Performed at [facility], [place] [address], [place], [place] [address]    Report Status 05/20/2017 FINAL  Final      Studies:  Dg Chest Port 1 View  Result Date: 05/19/2017 CLINICAL DATA:  83 year old female with suspected aspiration. EXAM: PORTABLE CHEST 1 VIEW COMPARISON:  Chest x-ray 05/14/2017. FINDINGS: Lung volumes are normal. No consolidative airspace disease. Multiple nodular densities are again noted throughout the mid to lower lungs bilaterally, compatible with recently documented metastatic disease to the lungs (chest CT 05/06/2017). No pleural effusions. No evidence of pulmonary edema. Heart size is mildly enlarged. Upper mediastinal contours are within normal limits. Aortic atherosclerosis. IMPRESSION: 1. No radiographic evidence of acute cardiopulmonary disease. 2. Metastatic disease to the lungs redemonstrated, better appreciated on prior chest CT 05/06/2017. 3. Mild cardiomegaly. 4. Aortic atherosclerosis. Electronically Signed   By: Vinnie Langton M.D.   On: 05/19/2017 12:12    Assessment: 83 y.o. [place] woman status post left breast upper outer quadrant biopsy and left  axillary lymph node biopsy 04/08/2017, both positive for invasive ductal carcinoma, grade 2, estrogen and progesterone receptor positive, HER-2 nonamplified, with an MIB-1 of 60%             (a) right breast upper inner quadrant biopsy showed ductal carcinoma in situ, high-grade, estrogen and progesterone receptor positive  METASTATIC DISEASE: May 2018  (a) Chest and head CT (non contrast) and bone scans show lung, left adrenal, liver and bone metastases  (1) anastrozole started 04/23/2017  (2) fulvestrant  started 05/17/2017  (3) palbociclib to start once patient stable   Plan:  I will meet with the patient's family tomorrow as they are available and discuss results of the CT and bone scans, which show widespread metastatic disease. Because the cancer is estrogen receptor positive, it may be possible to control it with minimum side effects and the patient has already started anastrozole and fulvestrant. She will be due for her next fulvestrant dose June 8th. I will write to resume anastrozole in AM.  A DNR as written is entirely appropriate in this case.  Greatly appreciate hospitalist's and palliative team's help to this patient and her family.   Chauncey Cruel, MD 05/20/2017  5:05 PM Medical Oncology and Hematology [facility] [address] [place], [place] [address] Tel. [phone]    Fax. [phone]

## 2017-05-20 NOTE — Consult Note (Addendum)
Palliative Care Consult Note Requested by: Dr. Charlies Silvers  83 yo with metastatic breast cancer to bone- skull, ribs, vertebral. She has seen Dr. Jana Hakim as an outpatient, plans for surgery mostly due to pain and discomfort were planned but she was admitted with sepsis and declined rapidly at home following oncology evaluation.   I spoke with her son Samantha Abbott who is a security guard at [employer]- he understands concept of hospice and palliative care and is awaiting guidance by oncology on how to proceed-he values his mothers QOL and is not opposed to a strictly comfort care route if the treatments are going to create more problems. She has been mostly in a wheelchair and her ADLs have been limited by significant pain. She has hypercalcemia and mental status changes- all poor prognostic factors. She has poor PO intake. Albumin is low. Significant frailty in the acute setting.  I would not recommend surgery-very high risk for complications. She received dose of Fulvestrant while inpatient on 5/26.   Plan is to connect with Dr. Jana Hakim tomorrow and make a plan for her care.  She has a DNR, I have also added on scheduled tylenol and opioid PRNs for pain. She may benefit from trial of steroids depending on treatment plan.  Another significant goal is that family wants to keep her at home. No SNF.  Will follow for needs.  Samantha Hacker, DO Palliative Medicine   Time: 50 minutes Greater than 50%  of this time was spent counseling and coordinating care related to the above assessment and plan.

## 2017-05-20 NOTE — Progress Notes (Signed)
Physical Therapy Treatment Patient Details Name: Samantha Abbott MRN: [id number] DOB: 1925-08-04 Today's Date: 05/20/2017    History of Present Illness 83 yo female admitted with sepsis, weakness. Hx of recent breast cancer dx, HTN    PT Comments    Pt's cognitive appears improved today and pt attempting to participate and self assist.  Pt requiring at least moderate assist at this time for stand pivot transfer to recliner.  Pt states she wants to get back home.   Follow Up Recommendations  SNF;Supervision/Assistance - 24 hour (family declines per notes, will need assist for safe mobility at home )     Equipment Recommendations  Hospital bed;Wheelchair (measurements PT);3in1 (PT)    Recommendations for Other Services       Precautions / Restrictions Precautions Precautions: Fall    Mobility  Bed Mobility Overal bed mobility: Needs Assistance Bed Mobility: Supine to Sit     Supine to sit: Mod assist     General bed mobility comments: assist for upper body and scooting to EOB, pt requires increased time however does attempt to self assist, utilized bed pad  Transfers Overall transfer level: Needs assistance Equipment used: Rolling walker (2 wheeled) Transfers: Sit to/from [facility] Sit to Stand: Mod assist;+2 physical assistance;+2 safety/equipment Stand pivot transfers: Min assist       General transfer comment: assist to rise, steady and control descent, verbal cues for hand placement, attempted to stand once however returned to sitting (posterior lean), attempted again and cues to improved posterior lean then able to pivot to recliner  Ambulation/Gait                 Stairs            Wheelchair Mobility    Modified Rankin (Stroke Patients Only)       Balance                                            Cognition Arousal/Alertness: Awake/alert Behavior During Therapy: WFL for tasks  assessed/performed Overall Cognitive Status: Within Functional Limits for tasks assessed                                 General Comments: pt joking with family, appears cognition is back near baseline      Exercises      General Comments        Pertinent Vitals/Pain Pain Assessment: Faces Faces Pain Scale: Hurts little more Pain Location: reports no pain however states not to hold under L upper arm due to recent surgery (utilized gait belt) Pain Descriptors / Indicators: Tender Pain Intervention(s): Limited activity within patient's tolerance;Monitored during session;Repositioned    Home Living                      Prior Function            PT Goals (current goals can now be found in the care plan section) Progress towards PT goals: Progressing toward goals    Frequency    Min 3X/week      PT Plan Current plan remains appropriate    Co-evaluation              AM-PAC PT "6 Clicks" Daily Activity  Outcome Measure  Difficulty turning over in bed (  including adjusting bedclothes, sheets and blankets)?: A Lot Difficulty moving from lying on back to sitting on the side of the bed? : A Lot Difficulty sitting down on and standing up from a chair with arms (e.g., wheelchair, bedside commode, etc,.)?: A Lot Help needed moving to and from a bed to chair (including a wheelchair)?: A Lot Help needed walking in hospital room?: Total Help needed climbing 3-5 steps with a railing? : Total 6 Click Score: 10    End of Session Equipment Utilized During Treatment: Gait belt Activity Tolerance: Patient limited by fatigue Patient left: with call bell/phone within reach;in chair;with family/visitor present (agreeable to call nursing for pt assist back to bed - pt to sit up and eat) Nurse Communication: Mobility status (+2 for safety) PT Visit Diagnosis: Muscle weakness (generalized) (M62.81);Difficulty in walking, not elsewhere classified (R26.2)      Time: 7893-8101 PT Time Calculation (min) (ACUTE ONLY): 16 min  Charges:  $Therapeutic Activity: 8-22 mins                    G Codes:      Carmelia Bake, PT, DPT 05/20/2017 Pager: [phone]  York Ram E 05/20/2017, 1:59 PM

## 2017-05-20 NOTE — Progress Notes (Deleted)
No show

## 2017-05-20 NOTE — Progress Notes (Signed)
Patient ID: Samantha Abbott, female   DOB: Dec 01, 1925, 83 y.o.   MRN: [id number]  PROGRESS NOTE    MISHAEL KRYSIAK  CWC:[id number] DOB: Oct 18, 1925 DOA: 05/14/2017  PCP: Benito Mccreedy, MD   Brief Narrative:  83 year old female with past medical history of invasive ductal breast cancer (diagnosed 03/2017), hypertension, GERD who presented to ED with worsening weakness over past 4-5 days PTA. Pt was subsequently found to have UTI. She also has difficulty swallowing and is at risk of aspiration. SLP consulted, recommending thin liquids and soft diet as pt tolerates. I spoke with the pt family over the phone this am, she prefers to continue this diet. She told me her mother would not want feeding tube at all.    Assessment & Plan:   Principal Problem: Sepsis secondary to Lactobacillus UTI ([place]) / Leukocytosis  - Sepsis criteria met on the admission with tachycardia, tachypnea, leukocytosis - Urine cx grew Lactobacillus species  - Continue zosyn for now - Blood cx negative so far   Active Problems:   Aspiration pneumonitis / Leukocytosis  - Pt at risk of aspiration due to dementia, she is pocketing meds in mouth per RN - CXR with no acute cardiopulmonary findings - Continue zosyn     Hypernatremia - Due to poor po intake, dehydration - Sodium 151 this am - Continue to monitor BMP    Hypokalemia - Due to poor po intake - Supplemented - Follow up BMP in am  Carcinoma of upper-outer quadrant of left breast in female, estrogen receptor positive ([place]) / metastasis to lung, bone, liver and in the left adrenal gland - Oncology has seen the patient in consultation, per oncology, cancer has spread to bone, lung, liver and left adrenal gland. Cancer is not curable and treatment is palliative.  - Fulvestrant ordered 5/26 - Palliative care team is following   Anemia of chronic disease - Due to malignancy and CKD - Hgb 10.7 this am     Thrombocytopenia - Mild drop  in platelet count this am, 140 - Pt on subQ heparin for DVT prophylaxis  - Hold sub Q heparin if further drop in hgb   Elevated troponin - Likely demand ischemia from sepsis  - No reports of chest pain this am  Hypercalcemia - Likely due to dehydration - Pamidronate given 5/26 - Calcium 9.6 this am  Essential hypertension - Holding Cozaar due to CRI - BP stable at 126/74 - Continue hydralazine and metoprolol   Acute kidney injury superimposed on chronic kidney disease stage 4(HCC) / metabolic acidosis - Cr stable 1.69 - 1.72  Moderate protein calorie malnutrition - In the context of chronic illness - CXR 5/28 showed no acute cardiopulmonary disease  - Per SLP - thin liquids with soft food    DVT prophylaxis: SubQ heparin  Code Status: DNR/DNI Family Communication: spoke with pt daughter this am about pt nutrition and current SLP recommendations, also spoke about D/C plan when she is stable she will return home with [facility] PT Disposition Plan: home once stable likely in next 48-72 hours    Consultants:   PT  SLP  Palliative care  Procedures:   None   Antimicrobials:   Rocephin 5/23 --> 5/28  Zosyn 5/28 -->    Subjective: No overnight events.  Objective: Vitals:   05/19/17 2052 05/19/17 2152 05/20/17 0521 05/20/17 0551  BP: (!) 151/75 (!) 163/76 126/74 126/74  Pulse: 70  72   Resp: 18  20   Temp:  97.7 F (36.5 C)  97.8 F (36.6 C)   TempSrc: Oral  Oral   SpO2: (!) 85%  91%   Weight:      Height:        Intake/Output Summary (Last 24 hours) at 05/20/17 1216 Last data filed at 05/20/17 1202  Gross per 24 hour  Intake              510 ml  Output                0 ml  Net              510 ml   Filed Weights   05/14/17 1632 05/14/17 2149  Weight: 65.8 kg (145 lb) 63.3 kg (139 lb 9.6 oz)    Examination:  Physical Exam  Constitutional: No distress.  CVS: Rate controlled , S1/S2 + Pulmonary: Effort and breath sounds normal, no  stridor Abdominal: Soft. BS +,  no distension, tenderness, rebound or guarding.  Musculoskeletal: No edema and no tenderness.  Neuro: Alert, no focal deficits  Skin: warm, dry  Psychiatric: Normal mood and affect.    Data Reviewed: I have personally reviewed following labs and imaging studies  CBC:  Recent Labs Lab 05/14/17 1755  05/16/17 0335 05/17/17 0719 05/18/17 0400 05/19/17 0353 05/20/17 0535  WBC 16.8*  < > 16.3* 17.1* 19.7* 22.2* 21.5*  NEUTROABS 14.1*  --   --   --   --   --   --   HGB 12.2  < > 11.0* 11.7* 11.5* 12.5 10.7*  HCT 38.0  < > 34.6* 37.1 36.3 39.3 33.8*  MCV 93.8  < > 93.8 93.5 93.6 94.9 94.2  PLT 255  < > 245 262 216 159 140*  < > = values in this interval not displayed. Basic Metabolic Panel:  Recent Labs Lab 05/16/17 0335 05/17/17 0719 05/18/17 0400 05/19/17 0353 05/20/17 0535  NA 148* 152* 152* 152* 151*  K 3.6 3.2* 3.9 5.1 3.3*  CL 116* 119* 122* 121* 120*  CO2 21* 24 19* 17* 18*  GLUCOSE 90 108* 91 74 79  BUN 44* 36* 32* 29* 26*  CREATININE 2.26* 1.78* 1.72* 1.69* 1.70*  CALCIUM 11.7* 12.0* 11.3* 10.6* 9.6   GFR: Estimated Creatinine Clearance: 19.1 mL/min (A) (by C-G formula based on SCr of 1.7 mg/dL (H)). Liver Function Tests:  Recent Labs Lab 05/14/17 1755  AST 37  ALT 14  ALKPHOS 146*  BILITOT 1.2  PROT 7.7  ALBUMIN 3.1*   No results for input(s): LIPASE, AMYLASE in the last 168 hours. No results for input(s): AMMONIA in the last 168 hours. Coagulation Profile: No results for input(s): INR, PROTIME in the last 168 hours. Cardiac Enzymes:  Recent Labs Lab 05/14/17 1755 05/15/17 0350  TROPONINI 0.07* 0.08*   BNP (last 3 results) No results for input(s): PROBNP in the last 8760 hours. HbA1C: No results for input(s): HGBA1C in the last 72 hours. CBG: No results for input(s): GLUCAP in the last 168 hours. Lipid Profile: No results for input(s): CHOL, HDL, LDLCALC, TRIG, CHOLHDL, LDLDIRECT in the last 72  hours. Thyroid Function Tests: No results for input(s): TSH, T4TOTAL, FREET4, T3FREE, THYROIDAB in the last 72 hours. Anemia Panel: No results for input(s): VITAMINB12, FOLATE, FERRITIN, TIBC, IRON, RETICCTPCT in the last 72 hours. Urine analysis:    Component Value Date/Time   COLORURINE YELLOW 05/14/2017 1641   APPEARANCEUR HAZY (A) 05/14/2017 1641   LABSPEC 1.014 05/14/2017 1641  PHURINE 5.0 05/14/2017 1641   GLUCOSEU NEGATIVE 05/14/2017 1641   HGBUR MODERATE (A) 05/14/2017 1641   BILIRUBINUR NEGATIVE 05/14/2017 1641   KETONESUR 5 (A) 05/14/2017 1641   PROTEINUR 30 (A) 05/14/2017 1641   UROBILINOGEN 1 04/16/2012 1205   NITRITE NEGATIVE 05/14/2017 1641   LEUKOCYTESUR SMALL (A) 05/14/2017 1641   Sepsis Labs: _0 (procalcitonin:4,lacticidven:4)    Recent Results (from the past 240 hour(s))  Urine culture     Status: Abnormal   Collection Time: 05/14/17  4:41 PM  Result Value Ref Range Status   Specimen Description URINE, CLEAN CATCH  Final   Special Requests NONE  Final   Culture (A)  Final    >=100,000 COLONIES/mL LACTOBACILLUS SPECIES Standardized susceptibility testing for this organism is not available. Performed at [facility], [place] [address], [place], [place] [address]    Report Status 05/16/2017 FINAL  Final  Blood culture (routine x 2)     Status: None (Preliminary result)   Collection Time: 05/14/17  9:13 PM  Result Value Ref Range Status   Specimen Description BLOOD BLOOD LEFT HAND  Final   Special Requests IN PEDIATRIC BOTTLE Blood Culture adequate volume  Final   Culture   Final    NO GROWTH 4 DAYS Performed at [facility], [place] [address], [place], [place] [address]    Report Status PENDING  Incomplete  Blood culture (routine x 2)     Status: None (Preliminary result)   Collection Time: 05/14/17 10:20 PM  Result Value Ref Range Status   Specimen Description BLOOD RIGHT ARM  Final   Special Requests IN PEDIATRIC BOTTLE Blood Culture  adequate volume  Final   Culture   Final    NO GROWTH 4 DAYS Performed at [facility], [place] [address], [place], [place] [address]    Report Status PENDING  Incomplete      Radiology Studies: Dg Chest Port 1 View  Result Date: 05/19/2017 CLINICAL DATA:  83 year old female with suspected aspiration. EXAM: PORTABLE CHEST 1 VIEW COMPARISON:  Chest x-ray 05/14/2017. FINDINGS: Lung volumes are normal. No consolidative airspace disease. Multiple nodular densities are again noted throughout the mid to lower lungs bilaterally, compatible with recently documented metastatic disease to the lungs (chest CT 05/06/2017). No pleural effusions. No evidence of pulmonary edema. Heart size is mildly enlarged. Upper mediastinal contours are within normal limits. Aortic atherosclerosis. IMPRESSION: 1. No radiographic evidence of acute cardiopulmonary disease. 2. Metastatic disease to the lungs redemonstrated, better appreciated on prior chest CT 05/06/2017. 3. Mild cardiomegaly. 4. Aortic atherosclerosis. Electronically Signed   By: Vinnie Langton M.D.   On: 05/19/2017 12:12     Scheduled Meds: . acetaminophen  1,000 mg Oral TID  . feeding supplement (ENSURE ENLIVE)  237 mL Oral TID BM  . hydrALAZINE  10 mg Intravenous Q8H  . metoprolol tartrate  2.5 mg Intravenous Q12H   Continuous Infusions: . sodium chloride 50 mL/hr at 05/19/17 1120  . piperacillin-tazobactam (ZOSYN)  IV 2.25 g (05/20/17 1202)  . potassium chloride       LOS: 6 days    Time spent: 25 minutes  Greater than 50% of the time spent on counseling and coordinating the care.   Leisa Lenz, MD [facility] Pager [phone]  If 7PM-7AM, please contact night-coverage [email or web address] Password [email or web address] 05/20/2017, 12:16 PM

## 2017-05-20 NOTE — Care Management Note (Signed)
Case Management Note  Patient Details  Name: Samantha Abbott MRN: [id number] Date of Birth: 09/26/1925  Subjective/Objective:   83 yo admitted with sepsis.                  Action/Plan: From home with son Alvin. This CM met with pt at bedside to discuss dc plans. Pt appears drowsy and confused so son Alvin called for planning purposes. Alvin states that pt will be coming back home with him at discharge and home health services would be needed. This CM left home health provider list in room for son to look at. Son states that he is having a hospital bed delivered today and they have a wheelchair and walker already. Request made for 3in1 and order received. [facility] alerted of need for 3in1. CM to follow up with son for choice.  Expected Discharge Date:   (unknown)               Expected Discharge Plan:  Home w Home Health Services  In-House Referral:  Clinical Social Work  Discharge planning Services  CM Consult  Post Acute Care Choice:  Home Health Choice offered to:  Adult Children  DME Arranged:  3-N-1 DME Agency:     HH Arranged:    HH Agency:     Status of Service:  In process, will continue to follow  If discussed at Long Length of Stay Meetings, dates discussed:    Additional Comments:  CLEMENTS, NORA H, RN 05/20/2017, 2:35 PM  [phone]  

## 2017-05-21 LAB — BASIC METABOLIC PANEL
ANION GAP: 12 (ref 5–15)
BUN: 24 mg/dL — AB (ref 6–20)
CHLORIDE: 123 mmol/L — AB (ref 101–111)
CO2: 17 mmol/L — ABNORMAL LOW (ref 22–32)
Calcium: 8.8 mg/dL — ABNORMAL LOW (ref 8.9–10.3)
Creatinine, Ser: 1.86 mg/dL — ABNORMAL HIGH (ref 0.44–1.00)
GFR calc Af Amer: 27 mL/min — ABNORMAL LOW (ref >60)
GFR, EST NON AFRICAN AMERICAN: 23 mL/min — AB (ref >60)
Glucose, Bld: 91 mg/dL (ref 65–99)
POTASSIUM: 3.6 mmol/L (ref 3.5–5.1)
SODIUM: 152 mmol/L — AB (ref 135–145)

## 2017-05-21 LAB — CBC
HCT: 30.1 % — ABNORMAL LOW (ref 36.0–46.0)
HEMOGLOBIN: 9.8 g/dL — AB (ref 12.0–15.0)
MCH: 30.2 pg (ref 26.0–34.0)
MCHC: 32.6 g/dL (ref 30.0–36.0)
MCV: 92.6 fL (ref 78.0–100.0)
Platelets: 135 10*3/uL — ABNORMAL LOW (ref 150–400)
RBC: 3.25 MIL/uL — ABNORMAL LOW (ref 3.87–5.11)
RDW: 15.5 % (ref 11.5–15.5)
WBC: 18.3 10*3/uL — AB (ref 4.0–10.5)

## 2017-05-21 LAB — GLUCOSE, CAPILLARY: GLUCOSE-CAPILLARY: 102 mg/dL — AB (ref 65–99)

## 2017-05-21 MED ORDER — DEXTROSE 5 % IV SOLN
INTRAVENOUS | Status: DC
  Administered 2017-05-21 – 2017-05-22 (×2): via INTRAVENOUS

## 2017-05-21 NOTE — Progress Notes (Signed)
PROGRESS NOTE  Samantha Abbott ZGY:[id number] DOB: 10-Aug-1925 DOA: 05/14/2017 PCP: Benito Mccreedy, MD   LOS: 7 days   Brief Narrative / Interim history: 83 year old female with past medical history of invasive ductal breast cancer (diagnosed 03/2017), hypertension, GERD who presented to ED with worsening weakness over past 4-5 days PTA. Pt was subsequently found to have UTI. She also has difficulty swallowing and is at risk of aspiration. SLP consulted, recommending thin liquids and soft diet as pt tolerates. Dr. Charlies Silvers spoke with the pt family, would not want feeding tube at all.   Assessment & Plan: Principal Problem:   Sepsis secondary to UTI [facility]) Active Problems:   Carcinoma of upper-outer quadrant of left breast in female, estrogen receptor positive ([place])   Elevated troponin   Hypercalcemia   Essential hypertension   Acute kidney injury superimposed on chronic kidney disease ([place])   Sepsis secondary to Lactobacillus UTI (HCC) / Leukocytosis  -Sepsis criteria met on admission, she was started on Zosyn, continue for now while she is here as she has unreliable p.o. intake -Blood cultures remain negative -Leukocytosis improving  Aspiration pneumonitis / Leukocytosis  -High risk of aspiration, speech evaluated patient recommending full/thin liquids and can try softer textures -On Zosyn as above, continue  Hypernatremia -Due to poor p.o. intake, sodium in fact increased today to 152, change fluids to dextrose water -Continue to monitor  Hypokalemia -Likely due to decreased p.o. intake, improved after repletion is 3.6 this morning, repeat tomorrow  Carcinoma of upper-outer quadrant of left breast in female, estrogen receptor positive ([place]) / metastasis to lung, bone, liver and in the left adrenal gland -Oncology was following, discussed with Dr. Jana Hakim this morning -Palliative is following as well -Ongoing discussions regarding goals of care  Anemia of chronic  disease -Due to malignancy and CKD  Thrombocytopenia -Slightly low today at 135, overall stable, no bleeding  Elevated troponin -Likely demand ischemia from sepsis, does not report chest pain  Hypercalcemia -Likely due to dehydration. Pamidronate given 5/26, improved and within normal limits at 8.8 this morning  Essential hypertension -Blood pressure stable 145/82 this morning, hold Cozaar, continue hydralazine and metoprolol  Acute kidney injury superimposed on chronic kidney disease stage 4(HCC) / metabolic acidosis -Cr stable 1.7-1.8  Moderate protein calorie malnutrition -In the context of chronic illness   DVT prophylaxis: heparin Code Status: DNR Family Communication: no family at bedside Disposition Plan: home when ready  Consultants:   Oncology   Palliative   Procedures:   None   Antimicrobials:  Rocephin 5/23 --> 5/28  Zosyn 5/28 -->    Subjective: -no complaints, confused  Objective: Vitals:   05/20/17 0551 05/20/17 1500 05/20/17 2049 05/21/17 0512  BP: 126/74 130/84 (!) 148/80 (!) 145/82  Pulse:  78 77 69  Resp:  18 18 18   Temp:  98 F (36.7 C) 98 F (36.7 C) 98.8 F (37.1 C)  TempSrc:  Oral Oral Oral  SpO2:  95% 99% 100%  Weight:      Height:        Intake/Output Summary (Last 24 hours) at 05/21/17 1338 Last data filed at 05/21/17 1139  Gross per 24 hour  Intake              380 ml  Output                0 ml  Net              380 ml  Filed Weights   05/14/17 1632 05/14/17 2149  Weight: 65.8 kg (145 lb) 63.3 kg (139 lb 9.6 oz)    Examination:  Vitals:   05/20/17 0551 05/20/17 1500 05/20/17 2049 05/21/17 0512  BP: 126/74 130/84 (!) 148/80 (!) 145/82  Pulse:  78 77 69  Resp:  18 18 18   Temp:  98 F (36.7 C) 98 F (36.7 C) 98.8 F (37.1 C)  TempSrc:  Oral Oral Oral  SpO2:  95% 99% 100%  Weight:      Height:        Constitutional: NAD ENMT: Mucous membranes are dry Respiratory: clear to auscultation  bilaterally, no wheezing, no crackles. Normal respiratory effort. No accessory muscle use.  Cardiovascular: Regular rate and rhythm, no murmurs / rubs / gallops. No LE edema. 2+ pedal pulses. No carotid bruits.  Abdomen: no tenderness. Bowel sounds positive.  Neurologic: non focal    Data Reviewed: I have personally reviewed following labs and imaging studies  CBC:  Recent Labs Lab 05/14/17 1755  05/17/17 0719 05/18/17 0400 05/19/17 0353 05/20/17 0535 05/21/17 0413  WBC 16.8*  < > 17.1* 19.7* 22.2* 21.5* 18.3*  NEUTROABS 14.1*  --   --   --   --   --   --   HGB 12.2  < > 11.7* 11.5* 12.5 10.7* 9.8*  HCT 38.0  < > 37.1 36.3 39.3 33.8* 30.1*  MCV 93.8  < > 93.5 93.6 94.9 94.2 92.6  PLT 255  < > 262 216 159 140* 135*  < > = values in this interval not displayed. Basic Metabolic Panel:  Recent Labs Lab 05/17/17 0719 05/18/17 0400 05/19/17 0353 05/20/17 0535 05/21/17 0413  NA 152* 152* 152* 151* 152*  K 3.2* 3.9 5.1 3.3* 3.6  CL 119* 122* 121* 120* 123*  CO2 24 19* 17* 18* 17*  GLUCOSE 108* 91 74 79 91  BUN 36* 32* 29* 26* 24*  CREATININE 1.78* 1.72* 1.69* 1.70* 1.86*  CALCIUM 12.0* 11.3* 10.6* 9.6 8.8*   GFR: Estimated Creatinine Clearance: 17.5 mL/min (A) (by C-G formula based on SCr of 1.86 mg/dL (H)). Liver Function Tests:  Recent Labs Lab 05/14/17 1755  AST 37  ALT 14  ALKPHOS 146*  BILITOT 1.2  PROT 7.7  ALBUMIN 3.1*   No results for input(s): LIPASE, AMYLASE in the last 168 hours. No results for input(s): AMMONIA in the last 168 hours. Coagulation Profile: No results for input(s): INR, PROTIME in the last 168 hours. Cardiac Enzymes:  Recent Labs Lab 05/14/17 1755 05/15/17 0350  TROPONINI 0.07* 0.08*   BNP (last 3 results) No results for input(s): PROBNP in the last 8760 hours. HbA1C: No results for input(s): HGBA1C in the last 72 hours. CBG:  Recent Labs Lab 05/20/17 2139 05/20/17 2209 05/21/17 0021  GLUCAP 80 106* 102*   Lipid  Profile: No results for input(s): CHOL, HDL, LDLCALC, TRIG, CHOLHDL, LDLDIRECT in the last 72 hours. Thyroid Function Tests: No results for input(s): TSH, T4TOTAL, FREET4, T3FREE, THYROIDAB in the last 72 hours. Anemia Panel: No results for input(s): VITAMINB12, FOLATE, FERRITIN, TIBC, IRON, RETICCTPCT in the last 72 hours. Urine analysis:    Component Value Date/Time   COLORURINE YELLOW 05/14/2017 1641   APPEARANCEUR HAZY (A) 05/14/2017 1641   LABSPEC 1.014 05/14/2017 1641   PHURINE 5.0 05/14/2017 1641   GLUCOSEU NEGATIVE 05/14/2017 1641   HGBUR MODERATE (A) 05/14/2017 1641   BILIRUBINUR NEGATIVE 05/14/2017 1641   KETONESUR 5 (A) 05/14/2017 1641  PROTEINUR 30 (A) 05/14/2017 1641   UROBILINOGEN 1 04/16/2012 1205   NITRITE NEGATIVE 05/14/2017 1641   LEUKOCYTESUR SMALL (A) 05/14/2017 1641   Sepsis Labs: Invalid input(s): PROCALCITONIN, LACTICIDVEN  Recent Results (from the past 240 hour(s))  Urine culture     Status: Abnormal   Collection Time: 05/14/17  4:41 PM  Result Value Ref Range Status   Specimen Description URINE, CLEAN CATCH  Final   Special Requests NONE  Final   Culture (A)  Final    >=100,000 COLONIES/mL LACTOBACILLUS SPECIES Standardized susceptibility testing for this organism is not available. Performed at [facility], [place] [address], [place], [place] [address]    Report Status 05/16/2017 FINAL  Final  Blood culture (routine x 2)     Status: None   Collection Time: 05/14/17  9:13 PM  Result Value Ref Range Status   Specimen Description BLOOD BLOOD LEFT HAND  Final   Special Requests IN PEDIATRIC BOTTLE Blood Culture adequate volume  Final   Culture   Final    NO GROWTH 5 DAYS Performed at [facility], [place] [address], [place], [place] [address]    Report Status 05/20/2017 FINAL  Final  Blood culture (routine x 2)     Status: None   Collection Time: 05/14/17 10:20 PM  Result Value Ref Range Status   Specimen Description BLOOD RIGHT ARM   Final   Special Requests IN PEDIATRIC BOTTLE Blood Culture adequate volume  Final   Culture   Final    NO GROWTH 5 DAYS Performed at [facility], [place] [address] [place], [place] [address]    Report Status 05/20/2017 FINAL  Final      Radiology Studies: No results found.   Scheduled Meds: . acetaminophen  1,000 mg Oral TID  . anastrozole  1 mg Oral Daily  . bisacodyl  5 mg Oral QHS  . feeding supplement (ENSURE ENLIVE)  237 mL Oral TID BM  . hydrALAZINE  10 mg Intravenous Q8H  . metoprolol tartrate  2.5 mg Intravenous Q12H   Continuous Infusions: . dextrose 75 mL/hr at 05/21/17 1002  . piperacillin-tazobactam (ZOSYN)  IV 2.25 g (05/21/17 1139)     Marzetta Board, MD, PhD [facility] Pager [phone]  If 7PM-7AM, please contact night-coverage [email or web address] Password [email or web address] 05/21/2017, 1:38 PM

## 2017-05-22 ENCOUNTER — Other Ambulatory Visit: Payer: Self-pay | Admitting: Oncology

## 2017-05-22 DIAGNOSIS — B370 Candidal stomatitis: Secondary | ICD-10-CM

## 2017-05-22 DIAGNOSIS — C7989 Secondary malignant neoplasm of other specified sites: Secondary | ICD-10-CM

## 2017-05-22 LAB — BASIC METABOLIC PANEL
Anion gap: 9 (ref 5–15)
BUN: 20 mg/dL (ref 6–20)
CALCIUM: 8.1 mg/dL — AB (ref 8.9–10.3)
CHLORIDE: 117 mmol/L — AB (ref 101–111)
CO2: 20 mmol/L — ABNORMAL LOW (ref 22–32)
CREATININE: 1.82 mg/dL — AB (ref 0.44–1.00)
GFR calc non Af Amer: 23 mL/min — ABNORMAL LOW (ref >60)
GFR, EST AFRICAN AMERICAN: 27 mL/min — AB (ref >60)
Glucose, Bld: 110 mg/dL — ABNORMAL HIGH (ref 65–99)
Potassium: 3 mmol/L — ABNORMAL LOW (ref 3.5–5.1)
Sodium: 146 mmol/L — ABNORMAL HIGH (ref 135–145)

## 2017-05-22 LAB — CBC
HCT: 27.8 % — ABNORMAL LOW (ref 36.0–46.0)
Hemoglobin: 9.1 g/dL — ABNORMAL LOW (ref 12.0–15.0)
MCH: 30 pg (ref 26.0–34.0)
MCHC: 32.7 g/dL (ref 30.0–36.0)
MCV: 91.7 fL (ref 78.0–100.0)
PLATELETS: 154 10*3/uL (ref 150–400)
RBC: 3.03 MIL/uL — AB (ref 3.87–5.11)
RDW: 15.5 % (ref 11.5–15.5)
WBC: 15.9 10*3/uL — AB (ref 4.0–10.5)

## 2017-05-22 MED ORDER — FLUCONAZOLE 100 MG PO TABS
100.0000 mg | ORAL_TABLET | Freq: Every day | ORAL | Status: DC
  Administered 2017-05-22 – 2017-05-23 (×2): 100 mg via ORAL
  Filled 2017-05-22 (×2): qty 1

## 2017-05-22 MED ORDER — POTASSIUM CHLORIDE 10 MEQ/100ML IV SOLN
10.0000 meq | INTRAVENOUS | Status: DC
  Administered 2017-05-22: 10 meq via INTRAVENOUS
  Filled 2017-05-22 (×5): qty 100

## 2017-05-22 MED ORDER — ACETAMINOPHEN 500 MG PO TABS: 1000.0000 mg | ORAL_TABLET | Freq: Three times a day (TID) | ORAL | Status: DC | PRN

## 2017-05-22 MED ORDER — OXYCODONE HCL 5 MG PO TABS
5.0000 mg | ORAL_TABLET | Freq: Four times a day (QID) | ORAL | Status: DC | PRN
  Administered 2017-05-22: 5 mg via ORAL
  Filled 2017-05-22: qty 1

## 2017-05-22 MED ORDER — SODIUM CHLORIDE 0.9 % IV SOLN
INTRAVENOUS | Status: AC
  Administered 2017-05-22: 19:00:00 via INTRAVENOUS
  Filled 2017-05-22: qty 1000

## 2017-05-22 MED ORDER — POTASSIUM CHLORIDE CRYS ER 20 MEQ PO TBCR
40.0000 meq | EXTENDED_RELEASE_TABLET | Freq: Two times a day (BID) | ORAL | Status: DC
  Administered 2017-05-22: 40 meq via ORAL
  Filled 2017-05-22: qty 2

## 2017-05-22 NOTE — Progress Notes (Signed)
PROGRESS NOTE  BETHENY SUCHECKI QIH:[id number] DOB: Sep 17, 1925 DOA: 05/14/2017 PCP: Benito Mccreedy, MD   LOS: 8 days   Brief Narrative / Interim history: 83 year old female with past medical history of invasive ductal breast cancer (diagnosed 03/2017), hypertension, GERD who presented to ED with worsening weakness over past 4-5 days PTA. Pt was subsequently found to have UTI. She also has difficulty swallowing and is at risk of aspiration. SLP consulted, recommending thin liquids and soft diet as pt tolerates.Per pt family, would not want feeding tube at all.   Assessment & Plan: Principal Problem:   Sepsis secondary to UTI [facility]) Active Problems:   Carcinoma of upper-outer quadrant of left breast in female, estrogen receptor positive ([place])   Elevated troponin   Hypercalcemia   Essential hypertension   Acute kidney injury superimposed on chronic kidney disease ([place])   Sepsis secondary to Lactobacillus UTI ([place]) / Leukocytosis  -Sepsis criteria met on admission, she was started on Zosyn, continue for now while she is here as she has unreliable p.o. intake -Blood cultures remain negative -Leukocytosis improving  Aspiration pneumonitis / Leukocytosis  -High risk of aspiration, speech evaluated patient recommending full/thin liquids and can try softer textures -On Zosyn as above, continue  Hypernatremia -Due to poor p.o. intake, improved with dextrose water, continue to monitor, Na 146 this morning  Hypokalemia -Likely due to decreased p.o. intake, 3.0 today, requiring more repletion. Monitor  Carcinoma of upper-outer quadrant of left breast in female, estrogen receptor positive ([place]) / metastasis to lung, bone, liver and in the left adrenal gland -Oncology was following, d/w Dr. Jana Hakim today  Anemia of chronic disease -Due to malignancy and CKD  Thrombocytopenia -now resolved  Elevated troponin -Likely demand ischemia from sepsis, does not report chest  pain  Hypercalcemia -Likely due to dehydration. Pamidronate given 5/26. Stable, no issues  Essential hypertension -Blood pressure stable 154/58 -continue to hold Cozaar, continue hydralazine and Metoprolol  Acute kidney injury superimposed on chronic kidney disease stage 4(HCC) / metabolic acidosis -Cr stable 1.7-1.8  Moderate protein calorie malnutrition -In the context of chronic illness   DVT prophylaxis: heparin Code Status: DNR Family Communication: no family at bedside Disposition Plan: home when ready  Consultants:   Oncology   Palliative   Procedures:   None   Antimicrobials:  Rocephin 5/23 --> 5/28  Zosyn 5/28 -->    Subjective: -no complaints, no chest pain, dyspnea, no abdominal pain, nausea or vomiting  Objective: Vitals:   05/21/17 0512 05/21/17 1510 05/21/17 2118 05/22/17 0526  BP: (!) 145/82 (!) 124/54 (!) 168/51 (!) 154/58  Pulse: 69 61 73 71  Resp: _0 Temp: 98.8 F (37.1 C) 98 F (36.7 C) 97.8 F (36.6 C) 98 F (36.7 C)  TempSrc: Oral Oral Axillary Oral  SpO2: 100% 100% 97% 99%  Weight:      Height:        Intake/Output Summary (Last 24 hours) at 05/22/17 1224 Last data filed at 05/22/17 0600  Gross per 24 hour  Intake           1887.5 ml  Output                0 ml  Net           1887.5 ml   Filed Weights   05/14/17 1632 05/14/17 2149  Weight: 65.8 kg (145 lb) 63.3 kg (139 lb 9.6 oz)    Examination:  Vitals:   05/21/17 8756  05/21/17 1510 05/21/17 2118 05/22/17 0526  BP: (!) 145/82 (!) 124/54 (!) 168/51 (!) 154/58  Pulse: 69 61 73 71  Resp: _0 Temp: 98.8 F (37.1 C) 98 F (36.7 C) 97.8 F (36.6 C) 98 F (36.7 C)  TempSrc: Oral Oral Axillary Oral  SpO2: 100% 100% 97% 99%  Weight:      Height:       Constitutional: NAD, calm, comfortable Neck: normal, supple Respiratory: clear to auscultation bilaterally, no wheezing, no crackles. Normal respiratory effort.  Cardiovascular: Regular rate and  rhythm, no murmurs / rubs / gallops. Abdomen: no tenderness. Bowel sounds positive.  Skin: no rashes, lesions, ulcers. No induration Neurologic: non focal  Data Reviewed: I have personally reviewed following labs and imaging studies  CBC:  Recent Labs Lab 05/18/17 0400 05/19/17 0353 05/20/17 0535 05/21/17 0413 05/22/17 0414  WBC 19.7* 22.2* 21.5* 18.3* 15.9*  HGB 11.5* 12.5 10.7* 9.8* 9.1*  HCT 36.3 39.3 33.8* 30.1* 27.8*  MCV 93.6 94.9 94.2 92.6 91.7  PLT 216 159 140* 135* 562   Basic Metabolic Panel:  Recent Labs Lab 05/18/17 0400 05/19/17 0353 05/20/17 0535 05/21/17 0413 05/22/17 0414  NA 152* 152* 151* 152* 146*  K 3.9 5.1 3.3* 3.6 3.0*  CL 122* 121* 120* 123* 117*  CO2 19* 17* 18* 17* 20*  GLUCOSE 91 74 79 91 110*  BUN 32* 29* 26* 24* 20  CREATININE 1.72* 1.69* 1.70* 1.86* 1.82*  CALCIUM 11.3* 10.6* 9.6 8.8* 8.1*   GFR: Estimated Creatinine Clearance: 17.9 mL/min (A) (by C-G formula based on SCr of 1.82 mg/dL (H)). Liver Function Tests: No results for input(s): AST, ALT, ALKPHOS, BILITOT, PROT, ALBUMIN in the last 168 hours. No results for input(s): LIPASE, AMYLASE in the last 168 hours. No results for input(s): AMMONIA in the last 168 hours. Coagulation Profile: No results for input(s): INR, PROTIME in the last 168 hours. Cardiac Enzymes: No results for input(s): CKTOTAL, CKMB, CKMBINDEX, TROPONINI in the last 168 hours. BNP (last 3 results) No results for input(s): PROBNP in the last 8760 hours. HbA1C: No results for input(s): HGBA1C in the last 72 hours. CBG:  Recent Labs Lab 05/20/17 2139 05/20/17 2209 05/21/17 0021  GLUCAP 80 106* 102*   Lipid Profile: No results for input(s): CHOL, HDL, LDLCALC, TRIG, CHOLHDL, LDLDIRECT in the last 72 hours. Thyroid Function Tests: No results for input(s): TSH, T4TOTAL, FREET4, T3FREE, THYROIDAB in the last 72 hours. Anemia Panel: No results for input(s): VITAMINB12, FOLATE, FERRITIN, TIBC, IRON,  RETICCTPCT in the last 72 hours. Urine analysis:    Component Value Date/Time   COLORURINE YELLOW 05/14/2017 1641   APPEARANCEUR HAZY (A) 05/14/2017 1641   LABSPEC 1.014 05/14/2017 1641   PHURINE 5.0 05/14/2017 1641   GLUCOSEU NEGATIVE 05/14/2017 1641   HGBUR MODERATE (A) 05/14/2017 1641   BILIRUBINUR NEGATIVE 05/14/2017 1641   KETONESUR 5 (A) 05/14/2017 1641   PROTEINUR 30 (A) 05/14/2017 1641   UROBILINOGEN 1 04/16/2012 1205   NITRITE NEGATIVE 05/14/2017 1641   LEUKOCYTESUR SMALL (A) 05/14/2017 1641   Sepsis Labs: Invalid input(s): PROCALCITONIN, LACTICIDVEN  Recent Results (from the past 240 hour(s))  Urine culture     Status: Abnormal   Collection Time: 05/14/17  4:41 PM  Result Value Ref Range Status   Specimen Description URINE, CLEAN CATCH  Final   Special Requests NONE  Final   Culture (A)  Final    >=100,000 COLONIES/mL LACTOBACILLUS SPECIES Standardized susceptibility testing for this organism is  not available. Performed at [facility], [place] [address], [place], [place] [address]    Report Status 05/16/2017 FINAL  Final  Blood culture (routine x 2)     Status: None   Collection Time: 05/14/17  9:13 PM  Result Value Ref Range Status   Specimen Description BLOOD BLOOD LEFT HAND  Final   Special Requests IN PEDIATRIC BOTTLE Blood Culture adequate volume  Final   Culture   Final    NO GROWTH 5 DAYS Performed at [facility], [place] [address], [place], [place] [address]    Report Status 05/20/2017 FINAL  Final  Blood culture (routine x 2)     Status: None   Collection Time: 05/14/17 10:20 PM  Result Value Ref Range Status   Specimen Description BLOOD RIGHT ARM  Final   Special Requests IN PEDIATRIC BOTTLE Blood Culture adequate volume  Final   Culture   Final    NO GROWTH 5 DAYS Performed at [facility], [place] [address], [place], [place] [address]    Report Status 05/20/2017 FINAL  Final      Radiology Studies: No results  found.   Scheduled Meds: . acetaminophen  1,000 mg Oral TID  . anastrozole  1 mg Oral Daily  . bisacodyl  5 mg Oral QHS  . feeding supplement (ENSURE ENLIVE)  237 mL Oral TID BM  . fluconazole  100 mg Oral Daily  . hydrALAZINE  10 mg Intravenous Q8H  . metoprolol tartrate  2.5 mg Intravenous Q12H   Continuous Infusions: . dextrose 75 mL/hr at 05/21/17 1002  . piperacillin-tazobactam (ZOSYN)  IV Stopped (05/22/17 0256)  . potassium chloride       Marzetta Board, MD, PhD [facility] Pager [phone]  If 7PM-7AM, please contact night-coverage [email or web address] Password [facility] 05/22/2017, 12:24 PM

## 2017-05-22 NOTE — Progress Notes (Signed)
CSW following for disposition/ DC planning.  Pt's family had declined SNF previously but are now interested in SNF options for rehab. Spoke with pt's son (son at bedside) and provided bed offers, pt and family choose [facility].  Son states family plans to apply for MCD anticipating further care needs for pt.  CSW selected [facility] in the hub and informed admissions at facility.  Will continue following to assist with DC plan.  Sharren Bridge, MSW, LCSW Clinical Social Work 05/22/2017 [phone]

## 2017-05-22 NOTE — Care Management Important Message (Signed)
Important Message  Patient Details  Name: Samantha Abbott MRN: [id number] Date of Birth: November 15, 1925   Medicare Important Message Given:  Yes    Kerin Salen 05/22/2017, 10:19 AMImportant Message  Patient Details  Name: Samantha Abbott MRN: [id number] Date of Birth: 1925-11-08   Medicare Important Message Given:  Yes    Kerin Salen 05/22/2017, 10:19 AM

## 2017-05-22 NOTE — Progress Notes (Signed)
Physical Therapy Treatment Patient Details Name: Samantha Abbott MRN: [id number] DOB: September 11, 1925 Today's Date: 05/22/2017    History of Present Illness 83 yo female admitted with sepsis, weakness. Hx of recent breast cancer dx, HTN    PT Comments    Progressing slowly with mobility. Mod encouragement required to get pt to progress activity-pt states "I can't walk..I will fall." She was able to walk 7 feet on today with use of a RW. She remains very weak. Continue to recommend SNF.     Follow Up Recommendations  SNF     Equipment Recommendations  Hospital bed;Wheelchair ;3in1    Recommendations for Other Services       Precautions / Restrictions Precautions Precautions: Fall Restrictions Weight Bearing Restrictions: No    Mobility  Bed Mobility Overal bed mobility: Needs Assistance Bed Mobility: Rolling;Sidelying to Sit Rolling: Min assist Sidelying to sit: Mod assist       General bed mobility comments: Assist for trunk and LEs. Increased time. Cues for safety, technique.   Transfers Overall transfer level: Needs assistance Equipment used: Rolling walker (2 wheeled) Transfers: Sit to/from Stand Sit to Stand: Mod assist;+2 physical assistance;+2 safety/equipment;From elevated surface Stand pivot transfers: Min assist;+2 physical assistance;+2 safety/equipment       General transfer comment: assist to rise, steady and control descent. verbal cues for hand placement. Increased time. x2  Ambulation/Gait Ambulation/Gait assistance: Min assist;+2 physical assistance;+2 safety/equipment Ambulation Distance (Feet): 7 Feet Assistive device: Rolling walker (2 wheeled) Gait Pattern/deviations: Step-to pattern;Decreased step length - right;Decreased step length - left     General Gait Details: Assist to stabilize pt and maneuver with RW. Followed very closely with recliner. Pt fatigues very easily.    Stairs            Wheelchair Mobility    Modified  [place] (Stroke Patients Only)       Balance Overall balance assessment: Needs assistance           Standing balance-Leahy Scale: Poor                              Cognition Arousal/Alertness: Awake/alert Behavior During Therapy: WFL for tasks assessed/performed Overall Cognitive Status: Within Functional Limits for tasks assessed                                        Exercises      General Comments        Pertinent Vitals/Pain Pain Assessment: No/denies pain    Home Living                      Prior Function            PT Goals (current goals can now be found in the care plan section) Progress towards PT goals: Progressing toward goals    Frequency    Min 3X/week      PT Plan Current plan remains appropriate    Co-evaluation              AM-PAC PT "6 Clicks" Daily Activity  Outcome Measure  Difficulty turning over in bed (including adjusting bedclothes, sheets and blankets)?: A Lot Difficulty moving from lying on back to sitting on the side of the bed? : A Lot Difficulty sitting down on and standing up from a chair with arms (e.g.,  wheelchair, bedside commode, etc,.)?: A Lot Help needed moving to and from a bed to chair (including a wheelchair)?: A Lot Help needed walking in hospital room?: A Lot Help needed climbing 3-5 steps with a railing? : Total 6 Click Score: 11    End of Session Equipment Utilized During Treatment: Gait belt Activity Tolerance: Patient limited by fatigue Patient left: in chair;with call bell/phone within reach;with chair alarm set   PT Visit Diagnosis: Muscle weakness (generalized) (M62.81);Difficulty in walking, not elsewhere classified (R26.2)     Time: 3968-8648 PT Time Calculation (min) (ACUTE ONLY): 20 min  Charges:  $Gait Training: 8-22 mins                    G Codes:         Weston Anna, MPT Pager: [phone]

## 2017-05-22 NOTE — Progress Notes (Signed)
I met for over 30 minutes today with the patient, her daughter Neoma Laming, who is in from [place], her grandson [name], who is a PhD in education and teaches at [employer], and by phone the patient's son Ollen Gross.  I reviewed the patient's diagnosis. I emphasized that this tumor is estrogen receptor positive and therefore is being treated with anti-estrogens, specifically and anastrozole and fulvestrant. The patient received her first fulvestrant dose 05/17/2017 and she will be due for her next fulvestrant dose on 05/30/2017 and our office. We discussed the possible toxicities side effects and complications of these agents which the patient is tolerating well. They understand these are not chemotherapy agents.  The patient also had hypercalcemia. She received a dose of pamidronate on 05/17/2017. She will be started on denosumab/Xgeva monthly on an outpatient basis. We then reviewed the results of her recent scans which show not only multiple bone lesions throughout the appendicular skeleton and ribs, but also involvement of the lungs, liver, adrenal glands, and lymph nodes. There is no evidence of brain involvement.  We discussed the fact that this is now metastatic, stage IV breast cancer. We do not know how to cure her stage IV breast cancer and therefore the goal of treatment cannot be cure, the goal of treatment instead is control.  I think we have a good chance at controlling this cancer with the current treatment plan. We will reassess in approximately 3 months, so that sometime in August the patient's scans will be repeated. If there is evidence of progression we will have to discuss alternatives versus comfort care. If there is disease control we would simply continue on the current treatment.  The patient has a DO NOT RESUSCITATE order in place. This is appropriate and this was confirmed today. The patient stated clearly that she is very elderly and she does not want to be Alive on mechanical means or be  resuscitated in case of a terminal event. The family was present during this discussion and expressed no disagreement.  It is wonderful to have multiple family members interested but it also can cause communication issues. I have asked the family to identify one person who is going to be the main one to communicate with [place] and that person can then communicated with the rest of the family so that they are not multiple messages going back and forth causing confusion and compromising the treatment plan in patient's care  Today the patient complained of difficulty swallowing and taste alteration. She does have some thrush by exam. I written for Diflucan for this.  She tells me she is too weak to walk. She very likely will need to go to a rehabilitation situation at least temporarily but the family does want her to get back home at some point. I have placed a social worker consult to move this particularly since the daughter Neoma Laming will only be here 1 more day.  I have stopped the patient's opioids, which she was in any case not receiving. She should be treated with non-opioid medications to minimize concerns regarding confusion constipation and nausea.  The patient is already scheduled to return to our office on 05/27/2017 at 1 PM to receive her next dose of fulvestrant. Hopefully she will have been discharged by then  Please let [place] if I can be of further help at this point

## 2017-05-22 NOTE — Progress Notes (Signed)
Nutrition Follow-up  DOCUMENTATION CODES:   Non-severe (moderate) malnutrition in context of acute illness/injury  INTERVENTION:   Continue Ensure Enlive po TID, each supplement provides 350 kcal and 20 grams of protein Provide Magic cup TID with meals, each supplement provides 290 kcal and 9 grams of protein   RD to continue to monitor for needs  NUTRITION DIAGNOSIS:   Malnutrition related to acute illness, lethargy/confusion as evidenced by energy intake < 75% for > 7 days, mild depletion of body fat, mild depletion of muscle mass.  Ongoing.  GOAL:   Patient will meet greater than or equal to 90% of their needs  Progressing.  MONITOR:   PO intake, Supplement acceptance, Labs, Weight trends, I & O's  REASON FOR ASSESSMENT:   Malnutrition Screening Tool    ASSESSMENT:   83 year old female with past medical history of invasive ductal breast cancer (diagnosed 03/2017), hypertension, GERD who presented to ED with worsening weakness over past 4-5 days PTA> Pt is not able to walk and her appetite has significantly decreased.   Patient currently consuming 25-70% of meals. Pt continues to have issues with difficulty swallowing, taste changes and has signs of thrush. Pt is inconsistently drinking supplements. Will order Magic Cups as pt is eating ice cream with meals.  Per [place] discussions, noted pt's desire to not have a feeding tube.  Labs reviewed: D5 solution at 75 ml/hr -provides 306 kcal Elevated Na Low K GFR: 27  Diet Order:  Diet full liquid Room service appropriate? Yes; Fluid consistency: Thin  Skin:  Reviewed, no issues  Last BM:  5/30  Height:   Ht Readings from Last 1 Encounters:  05/14/17 5\' 1"  (1.549 m)    Weight:   Wt Readings from Last 1 Encounters:  05/14/17 139 lb 9.6 oz (63.3 kg)    Ideal Body Weight:  47.7 kg  BMI:  Body mass index is 26.38 kg/m.  Estimated Nutritional Needs:   Kcal:  1500-1700  Protein:  65-75g  Fluid:   1.7L/day  EDUCATION NEEDS:   No education needs identified at this time  Clayton Bibles, MS, RD, LDN Pager: [phone] After Hours Pager: [phone]

## 2017-05-23 DIAGNOSIS — T17908D Unspecified foreign body in respiratory tract, part unspecified causing other injury, subsequent encounter: Secondary | ICD-10-CM

## 2017-05-23 LAB — BASIC METABOLIC PANEL
ANION GAP: 7 (ref 5–15)
BUN: 18 mg/dL (ref 6–20)
CALCIUM: 7.6 mg/dL — AB (ref 8.9–10.3)
CO2: 20 mmol/L — AB (ref 22–32)
CREATININE: 1.89 mg/dL — AB (ref 0.44–1.00)
Chloride: 117 mmol/L — ABNORMAL HIGH (ref 101–111)
GFR calc Af Amer: 26 mL/min — ABNORMAL LOW (ref >60)
GFR, EST NON AFRICAN AMERICAN: 22 mL/min — AB (ref >60)
GLUCOSE: 90 mg/dL (ref 65–99)
Potassium: 3.4 mmol/L — ABNORMAL LOW (ref 3.5–5.1)
Sodium: 144 mmol/L (ref 135–145)

## 2017-05-23 MED ORDER — AMOXICILLIN-POT CLAVULANATE 500-125 MG PO TABS: 1.0000 | ORAL_TABLET | Freq: Two times a day (BID) | ORAL | Status: DC

## 2017-05-23 MED ORDER — ENSURE ENLIVE PO LIQD: 237.0000 mL | Freq: Three times a day (TID) | ORAL | 12 refills | Status: AC

## 2017-05-23 MED ORDER — ANASTROZOLE 1 MG PO TABS: 1.0000 mg | ORAL_TABLET | Freq: Every day | ORAL | Status: DC

## 2017-05-23 MED ORDER — FLUCONAZOLE 100 MG PO TABS: 100.0000 mg | ORAL_TABLET | Freq: Every day | ORAL | Status: DC

## 2017-05-23 MED ORDER — AMOXICILLIN-POT CLAVULANATE 875-125 MG PO TABS
1.0000 | ORAL_TABLET | Freq: Two times a day (BID) | ORAL | 0 refills | Status: DC
Start: 2017-05-23 — End: 2017-05-23

## 2017-05-23 MED ORDER — OXYCODONE HCL 5 MG PO TABS: 5.0000 mg | ORAL_TABLET | Freq: Four times a day (QID) | ORAL | 0 refills | Status: DC | PRN

## 2017-05-23 MED ORDER — HYDRALAZINE HCL 10 MG PO TABS
10.0000 mg | ORAL_TABLET | Freq: Four times a day (QID) | ORAL | 11 refills | Status: DC
Start: 2017-05-23 — End: 2017-07-28

## 2017-05-23 NOTE — Progress Notes (Signed)
CSW assisting with d/c planning. Pt has SNF bed at [facility] today. CSW will assist with d/c planning to SNF today.  Werner Lean LCSW [phone]

## 2017-05-23 NOTE — Clinical Social Work Placement (Signed)
   CLINICAL SOCIAL WORK PLACEMENT  NOTE  Date:  05/23/2017  Patient Details  Name: Samantha Abbott MRN: [id number] Date of Birth: Dec 26, 1924  Clinical Social Work is seeking post-discharge placement for this patient at the [place] level of care (*CSW will initial, date and re-position this form in  chart as items are completed):  Yes   Patient/family provided with [facility] Work Department's list of facilities offering this level of care within the geographic area requested by the patient (or if unable, by the patient's family).  Yes   Patient/family informed of their freedom to choose among providers that offer the needed level of care, that participate in Medicare, Medicaid or managed care program needed by the patient, have an available bed and are willing to accept the patient.  Yes   Patient/family informed of [facility]'s ownership interest in [facility] and [facility], as well as of the fact that they are under no obligation to receive care at these facilities.  PASRR submitted to EDS on 05/22/17     PASRR number received on 05/22/17     Existing PASRR number confirmed on       FL2 transmitted to all facilities in geographic area requested by pt/family on 05/22/17     FL2 transmitted to all facilities within larger geographic area on       Patient informed that his/her managed care company has contracts with or will negotiate with certain facilities, including the following:        Yes   Patient/family informed of bed offers received.  Patient chooses bed at [facility]     Physician recommends and patient chooses bed at      Patient to be transferred to [facility] on 05/23/17.  Patient to be transferred to facility by [facility]     Patient family notified on 05/23/17 of transfer.  Name of family member notified:  SON     PHYSICIAN       Additional Comment: Pt / son are in agreement with d/c to  [facility] today. [facility] transport required. Medical necessity form completed. D/C Summary sent to SNF for review. Scripts included in [employer]. # for report provided to nsg.  _______________________________________________ Luretha Rued, [place] 05/23/2017, 12:14 PM

## 2017-05-23 NOTE — Discharge Summary (Addendum)
Physician Discharge Summary  Samantha Abbott SWF:[id number] DOB: 1925-03-31 DOA: 05/14/2017  PCP: Benito Mccreedy, MD  Admit date: 05/14/2017 Discharge date: 05/23/2017  Admitted From: home Disposition:  SNF  Recommendations for Outpatient Follow-up:  1. Follow up with PCP in 1-2 weeks 2. Follow up with Dr. Jana Hakim in 1-2 weeks 3. Continue Diflucan for 3 more days with end date 05/26/17 4. Continue Augmentin for 3 more days with end date 05/26/17   Discharge Condition: stable CODE STATUS: DNR Diet recommendation: soft  HPI: Per Dr. Ralene Ok Samantha Abbott is a 83 y.o. female with medical history significant of recently diagnosed invasive ductalbreast cancer on 03/2017, dysrhythmia, HTN, and GERD; who presents with generalized weakness over the last 4-5 days. Patient's son provides history as patient is unable to at this time. He notes that she has rapidly deteriorated since onset of symptoms. She is not able to walk  and will not eat or drink anything. Associated symptoms include lethargy, mild confusion, nausea, and mild nonproductive cough. Patient has not been having any fever, chills, chest pain, abdominal pain, vomiting, or diarrhea. Family is in significant need of obtaining a hospital bed to help care for her at home. She is followed by Dr. Gwenlyn Perking of oncology, and recently decided to go through with the surgery to have the cancer removed.  An appointment with Dr. Rush Farmer has been set for this following Tuesday. ED Course: Upon admission to the emergency department patient was seen to be afebrile, pulse 80's, respirations 18-19, and all other vitals maintained. Labs revealed WBC 16.8, CO2 19, BUN 50, creatinine 2.8, calcium 12, troponin 0.07. Chest x-ray shows a 12 mm left lower lobe nodule consistent with metastatic disease seen on previous CT scan. The patient's urinalysis was positive for signs of infection. Patient was started on Rocephin IV in the ED and sepsis protocol was  initiated given patient's immunocompromised status. While in the emergency department she also received at least 1 L IV fluids.  Hospital Course: Discharge Diagnoses:  Principal Problem:   Sepsis secondary to UTI [facility]) Active Problems:   Carcinoma of upper-outer quadrant of left breast in female, estrogen receptor positive ([place])   Elevated troponin   Hypercalcemia   Essential hypertension   Acute kidney injury superimposed on chronic kidney disease ([place])  Sepsis secondary to Lactobacillus UTI (HCC) / Leukocytosis -Sepsis criteria met on admission, she was started on Zosyn, responded well, will be transitioned to Augmentin for 3 more days.  -Blood cultures remained negative -Leukocytosis improved Aspiration pneumonitis / Leukocytosis  -High risk of aspiration, speech evaluated patient recommending full/thin liquids and can try softer textures -On Zosyn as above, transitioned to Augmentin, for 3 more days to complete an 8 day course Hypernatremia -Due to poor p.o. intake, improved, encourage fluids Hypokalemia -Likely due to decreased p.o. Intake, improved with repletion. Recheck BMP in 3-4 days Carcinoma of upper-outer quadrant of left breast in female, estrogen receptor positive (HCC/ metastasis to lung, bone, liver and in the left adrenal gland -Oncology following as an outpatient, on anastrozole Anemia of chronic disease -Due to malignancy and CKD, stable Thrombocytopenia -now resolved Elevated troponin -Likely demand ischemia from sepsis, does not report chest pain Hypercalcemia -Likely due to dehydration. Pamidronate given 5/26. Stable, no issues Essential hypertension -Blood pressure stable 154/58, continue to hold Cozaar due to CKD, changed to hydralazine.  Acute kidney injury superimposed on chronic kidney disease stage 4(HCC) / metabolic acidosis -Cr stable 1.7-1.8 Moderate protein calorie malnutrition -In the context of  chronic illness Thrush - Diflucan for 3 more  days  Discharge Instructions   Allergies as of 05/23/2017   No Known Allergies     Medication List    STOP taking these medications   hydrochlorothiazide 12.5 MG tablet Commonly known as:  HYDRODIURIL   losartan 100 MG tablet Commonly known as:  COZAAR     TAKE these medications   amoxicillin-clavulanate 500-125 MG tablet Commonly known as:  AUGMENTIN Take 1 tablet (500 mg total) by mouth 2 (two) times daily.   anastrozole 1 MG tablet Commonly known as:  ARIMIDEX Take 1 tablet (1 mg total) by mouth daily.   feeding supplement (ENSURE ENLIVE) Liqd Take 237 mLs by mouth 3 (three) times daily between meals.   fluconazole 100 MG tablet Commonly known as:  DIFLUCAN Take 1 tablet (100 mg total) by mouth daily.   hydrALAZINE 10 MG tablet Commonly known as:  APRESOLINE Take 1 tablet (10 mg total) by mouth 4 (four) times daily.   ondansetron 8 MG tablet Commonly known as:  ZOFRAN Take 1 tablet (8 mg total) by mouth every 8 (eight) hours as needed for nausea or vomiting.   oxyCODONE 5 MG immediate release tablet Commonly known as:  Oxy IR/ROXICODONE Take 1 tablet (5 mg total) by mouth every 6 (six) hours as needed for severe pain. What changed:  when to take this   polyethylene glycol packet Commonly known as:  MIRALAX / GLYCOLAX Take 17 g by mouth every other day.   rosuvastatin 10 MG tablet Commonly known as:  CRESTOR Take 10 mg by mouth daily.   vitamin B-12 1000 MCG tablet Commonly known as:  CYANOCOBALAMIN Take 1,000 mcg by mouth daily.   Vitamin D (Cholecalciferol) 1000 units Caps Take 1 capsule by mouth daily.            Durable Medical Equipment        Start     Ordered   05/20/17 1112  For home use only DME 3 n 1  Once     05/20/17 1111   05/20/17 1028  For home use only DME Hospital bed  Once    Question Answer Comment  Patient has (list medical condition): weakness   The above medical condition requires: Patient requires the ability to  reposition frequently   Head must be elevated greater than: 30 degrees   Bed type Semi-electric   Support Surface: Gel Overlay      05/20/17 1027      Contact information for follow-up providers    Osei-Bonsu, Iona Beard, MD. Schedule an appointment as soon as possible for a visit in 3 week(s).   Specialty:  Internal Medicine Contact information: [address] [place] [address] [phone]            Contact information for after-discharge care    [facility] Follow up.   Specialty:  Skilled Nursing Facility Contact information: [address] [place] [phone]                 No Known Allergies  Consultations:  Oncology   Procedures/Studies:  Ct Head Wo Contrast  Result Date: 05/14/2017 CLINICAL DATA:  Increasing weakness for several days. Altered mental status. Recent diagnosis of bilateral breast cancer. History of hypertension. EXAM: CT HEAD WITHOUT CONTRAST TECHNIQUE: Contiguous axial images were obtained from the base of the skull through the vertex without intravenous contrast. COMPARISON:  05/06/2017 FINDINGS: Brain:  Diffuse cerebral atrophy. Ventricular dilatation consistent with central atrophy. Low-attenuation changes in the deep white matter consistent with small vessel ischemia. No mass effect or midline shift. No abnormal extra-axial fluid collections. Gray-white matter junctions are distinct. Basal cisterns are not effaced. No acute intracranial hemorrhage. Vascular: Vascular calcifications are present. Skull: Circumscribed lucent lesions in the calvarium consistent with known metastatic disease. No depressed skull fractures. Sinuses/Orbits: Paranasal sinuses and mastoid air cells are clear. Other: No significant changes since previous study. IMPRESSION: No acute intracranial abnormalities. Chronic atrophy and small vessel ischemic changes. Skull metastases again demonstrated.  Electronically Signed   By: Lucienne Capers M.D.   On: 05/14/2017 23:45   Ct Head Wo Contrast  Result Date: 05/06/2017 CLINICAL DATA:  Staging breast cancer EXAM: CT HEAD WITHOUT CONTRAST TECHNIQUE: Contiguous axial images were obtained from the base of the skull through the vertex without intravenous contrast. COMPARISON:  None. FINDINGS: Brain: No visible swelling or masslike area to suggest metastatic disease, sensitivity diminished by lack of IV contrast. There is age congruent cerebral volume loss. No acute or remote infarct, hemorrhage, or hydrocephalus. Vascular: Atherosclerotic calcifications Skull: 19 mm diameter irregular lucency in the right frontal bone there are smaller additional lucencies including the left parietal bone on 10:18 and high right parietal bone on 10:25. Patient has widespread osseous metastatic disease on contemporaneous chest CT. Sinuses/Orbits: Negative Other: Density along the upper right parotid with rounded masslike areas on reformats, suggesting mass lesion. IMPRESSION: 1. No noncontrast CT evidence of brain metastasis. 2. Calvarial metastases. 3. Probable right parotid mass, but only seen on 1 slice. Electronically Signed   By: Monte Fantasia M.D.   On: 05/06/2017 09:10   Ct Chest Wo Contrast  Result Date: 05/06/2017 CLINICAL DATA:  Staging breast cancer.  New diagnosis April 2018 EXAM: CT CHEST WITHOUT CONTRAST TECHNIQUE: Multidetector CT imaging of the chest was performed following the standard protocol without IV contrast. COMPARISON:  Chest x-ray 11/29/2011 FINDINGS: Cardiovascular: The heart is normal in size. No pericardial effusion. There is tortuosity, ectasia and calcification of the thoracic aorta. Three-vessel coronary artery calcifications are noted. Mediastinum/Nodes: Scattered small mediastinal lymph nodes, likely benign. 6 mm prevascular lymph node on image number 53. 7 mm precarinal lymph node on image number 60. The esophagus is grossly normal.  Lungs/Pleura: Numerous small irregular pulmonary nodules atypical 5 likely metastatic disease. The largest lesion in the right lung is in the right lower lobe on image number 99 and measures 10.5 x 10 mm. The largest lesion in the left lung is in the left lower lobe on image number 70 8 measures 12.5 x 10 mm. Mild underlying emphysematous changes. No acute pulmonary findings. No pleural effusion. Upper Abdomen: Left adrenal gland lesion suspicious for metastasis. There is also an adjacent probable benign myelolipoma. Low-attenuation liver lesions consistent with metastatic disease. The largest lesion in the left lobe measures approximately 4 cm on image number 114. Chest wall/ Musculoskeletal: Large left lateral breast mass which is deep and close to the chest wall and consistent with patient's known breast cancer. This measures approximately 5 x 3.5 cm. Irregular 17 mm left axillary lymph node, likely metastatic disease. No supraclavicular lymphadenopathy. The right breast is grossly normal. Diffuse lytic osseous metastatic disease. No spinal canal compromise. Probable underlying changes of ankylosing spondylitis. IMPRESSION: 1. Large left lateral deep breast mass consistent with patient's known breast cancer. Adjacent axillary lymphadenopathy. 2. Diffuse metastatic disease involving the lungs, liver, left adrenal gland and bony structures. Electronically Signed  By: Marijo Sanes M.D.   On: 05/06/2017 09:43   Nm Bone Scan Whole Body  Result Date: 05/06/2017 CLINICAL DATA:  Left breast malignancy. No skeletal complaints or history of trauma. EXAM: NUCLEAR MEDICINE WHOLE BODY BONE SCAN TECHNIQUE: Whole body anterior and posterior images were obtained approximately 3 hours after intravenous injection of radiopharmaceutical. RADIOPHARMACEUTICALS:  Twin T mCi Technetium-78mMDP IV COMPARISON:  No previous bone scans for review. FINDINGS: There is adequate uptake of the radiopharmaceutical by the skeleton. There is  adequate soft tissue clearance and renal activity observed. There is abnormal uptake within the right frontal bone. There is an area of increased uptake in the left parietal bone. There is mildly increased uptake in the mid to lower cervical spine. There is intensely increased uptake over the upper thoracic spine. There is mildly increased uptake to the left of midline in the mid lumbar spine. There is intensely increased uptake within the manubrium. There is mildly increased uptake in the lateral aspect of approximately the right third rib and the anterior aspect of approximately the right fifth rib. There is increased uptake in the posterior aspect of approximately the right fourth rib. Uptake within the pelvis is unremarkable. Upper extremity uptake is normal with exception of a small amount of increased uptake in the superior medial aspect of the body of the right scapula. There is moderately increased uptake in the midshaft of the left femur. There is mildly increased uptake associated with the right knee and right foot that is likely degenerative in nature. IMPRESSION: Foci of abnormal uptake within the calvarium bilaterally, the cervical, thoracic, and lumbar spine, bilateral ribs, manubrium, medial aspect of the right scapula, and mid left femoral shaft. These findings are worrisome for metastatic disease. Electronically Signed   By: David  JMartiniqueM.D.   On: 05/06/2017 12:05   Dg Chest Port 1 View  Result Date: 05/19/2017 CLINICAL DATA:  83year old female with suspected aspiration. EXAM: PORTABLE CHEST 1 VIEW COMPARISON:  Chest x-ray 05/14/2017. FINDINGS: Lung volumes are normal. No consolidative airspace disease. Multiple nodular densities are again noted throughout the mid to lower lungs bilaterally, compatible with recently documented metastatic disease to the lungs (chest CT 05/06/2017). No pleural effusions. No evidence of pulmonary edema. Heart size is mildly enlarged. Upper mediastinal contours  are within normal limits. Aortic atherosclerosis. IMPRESSION: 1. No radiographic evidence of acute cardiopulmonary disease. 2. Metastatic disease to the lungs redemonstrated, better appreciated on prior chest CT 05/06/2017. 3. Mild cardiomegaly. 4. Aortic atherosclerosis. Electronically Signed   By: DVinnie LangtonM.D.   On: 05/19/2017 12:12   Dg Chest Port 1 View  Result Date: 05/14/2017 CLINICAL DATA:  WEAKNESS SINCE Friday. PER EMS, THE PT HAS A HIS OF BILATERAL BREAST CA, BUT NOT ON ANY TREATMENT YET. PER THE SON, THE PT HAS ELECTED TO HAVE SURGERY, BUT THEY ARE WAITING FOR THE ORDERS. THE PT HAS NOT BEEN EATING, DRINKING, AND INCONTINENT OF URINE AND STOOL. PT AAOX4, BUT SLOW TO RESPOND. PT STS, "ALL OF MY ENERGY IS GONE." EXAM: PORTABLE CHEST - 1 VIEW COMPARISON:  CT 05/06/2017 and previous FINDINGS: 12 mm nodule, left lower lobe. Right lung clear. Atheromatous aorta. Mild cardiomegaly. No effusion.  No pneumothorax. Anterior vertebral endplate spurring at multiple levels in the mid and lower thoracic spine. Mild degenerative change in the right shoulder. IMPRESSION: 1. No acute findings. 2. 12 mm left lower lung pulmonary nodule, new since 11/29/2011, likely corresponding to the metastatic disease seen on recent CT chest.  Electronically Signed   By: Lucrezia Europe M.D.   On: 05/14/2017 17:13     Subjective: - no chest pain, shortness of breath, no abdominal pain, nausea or vomiting.   Discharge Exam: Vitals:   05/23/17 0542 05/23/17 1055  BP: (!) 151/47 (!) 136/54  Pulse: 70   Resp: 20   Temp: 98.3 F (36.8 C)    Vitals:   05/22/17 1833 05/22/17 2059 05/23/17 0542 05/23/17 1055  BP: (!) 133/42 (!) 141/40 (!) 151/47 (!) 136/54  Pulse: 62 63 70   Resp: _0 Temp: 97.8 F (36.6 C) 98.5 F (36.9 C) 98.3 F (36.8 C)   TempSrc: Oral Oral Oral   SpO2: 100% 100% 99%   Weight:      Height:        General: Pt is alert, awake, not in acute distress Cardiovascular: RRR, S1/S2 +, no  rubs, no gallops Respiratory: CTA bilaterally, no wheezing, no rhonchi Abdominal: Soft, NT, ND, bowel sounds + Extremities: no edema, no cyanosis    The results of significant diagnostics from this hospitalization (including imaging, microbiology, ancillary and laboratory) are listed below for reference.     Microbiology: Recent Results (from the past 240 hour(s))  Urine culture     Status: Abnormal   Collection Time: 05/14/17  4:41 PM  Result Value Ref Range Status   Specimen Description URINE, CLEAN CATCH  Final   Special Requests NONE  Final   Culture (A)  Final    >=100,000 COLONIES/mL LACTOBACILLUS SPECIES Standardized susceptibility testing for this organism is not available. Performed at [facility], [place] [address] [place], [place] [address]    Report Status 05/16/2017 FINAL  Final  Blood culture (routine x 2)     Status: None   Collection Time: 05/14/17  9:13 PM  Result Value Ref Range Status   Specimen Description BLOOD BLOOD LEFT HAND  Final   Special Requests IN PEDIATRIC BOTTLE Blood Culture adequate volume  Final   Culture   Final    NO GROWTH 5 DAYS Performed at [facility], [place] [address], [place], [place] [address]    Report Status 05/20/2017 FINAL  Final  Blood culture (routine x 2)     Status: None   Collection Time: 05/14/17 10:20 PM  Result Value Ref Range Status   Specimen Description BLOOD RIGHT ARM  Final   Special Requests IN PEDIATRIC BOTTLE Blood Culture adequate volume  Final   Culture   Final    NO GROWTH 5 DAYS Performed at [facility], [place] [address] [place], [place] [address]    Report Status 05/20/2017 FINAL  Final     Labs: BNP (last 3 results) No results for input(s): BNP in the last 8760 hours. Basic Metabolic Panel:  Recent Labs Lab 05/19/17 0353 05/20/17 0535 05/21/17 0413 05/22/17 0414 05/23/17 0410  NA 152* 151* 152* 146* 144  K 5.1 3.3* 3.6 3.0* 3.4*  CL 121* 120* 123* 117* 117*  CO2 17* 18* 17*  20* 20*  GLUCOSE 74 79 91 110* 90  BUN 29* 26* 24* 20 18  CREATININE 1.69* 1.70* 1.86* 1.82* 1.89*  CALCIUM 10.6* 9.6 8.8* 8.1* 7.6*   Liver Function Tests: No results for input(s): AST, ALT, ALKPHOS, BILITOT, PROT, ALBUMIN in the last 168 hours. No results for input(s): LIPASE, AMYLASE in the last 168 hours. No results for input(s): AMMONIA in the last 168 hours. CBC:  Recent Labs Lab  05/18/17 0400 05/19/17 0353 05/20/17 0535 05/21/17 0413 05/22/17 0414  WBC 19.7* 22.2* 21.5* 18.3* 15.9*  HGB 11.5* 12.5 10.7* 9.8* 9.1*  HCT 36.3 39.3 33.8* 30.1* 27.8*  MCV 93.6 94.9 94.2 92.6 91.7  PLT 216 159 140* 135* 154   Cardiac Enzymes: No results for input(s): CKTOTAL, CKMB, CKMBINDEX, TROPONINI in the last 168 hours. BNP: Invalid input(s): POCBNP CBG:  Recent Labs Lab 05/20/17 2139 05/20/17 2209 05/21/17 0021  GLUCAP 80 106* 102*   D-Dimer No results for input(s): DDIMER in the last 72 hours. Hgb A1c No results for input(s): HGBA1C in the last 72 hours. Lipid Profile No results for input(s): CHOL, HDL, LDLCALC, TRIG, CHOLHDL, LDLDIRECT in the last 72 hours. Thyroid function studies No results for input(s): TSH, T4TOTAL, T3FREE, THYROIDAB in the last 72 hours.  Invalid input(s): FREET3 Anemia work up No results for input(s): VITAMINB12, FOLATE, FERRITIN, TIBC, IRON, RETICCTPCT in the last 72 hours. Urinalysis    Component Value Date/Time   COLORURINE YELLOW 05/14/2017 1641   APPEARANCEUR HAZY (A) 05/14/2017 1641   LABSPEC 1.014 05/14/2017 1641   PHURINE 5.0 05/14/2017 1641   GLUCOSEU NEGATIVE 05/14/2017 1641   HGBUR MODERATE (A) 05/14/2017 1641   BILIRUBINUR NEGATIVE 05/14/2017 1641   KETONESUR 5 (A) 05/14/2017 1641   PROTEINUR 30 (A) 05/14/2017 1641   UROBILINOGEN 1 04/16/2012 1205   NITRITE NEGATIVE 05/14/2017 1641   LEUKOCYTESUR SMALL (A) 05/14/2017 1641   Sepsis Labs Invalid input(s): PROCALCITONIN,  WBC,  LACTICIDVEN Microbiology Recent Results (from  the past 240 hour(s))  Urine culture     Status: Abnormal   Collection Time: 05/14/17  4:41 PM  Result Value Ref Range Status   Specimen Description URINE, CLEAN CATCH  Final   Special Requests NONE  Final   Culture (A)  Final    >=100,000 COLONIES/mL LACTOBACILLUS SPECIES Standardized susceptibility testing for this organism is not available. Performed at [facility], [place] [address] [place], [place] [address]    Report Status 05/16/2017 FINAL  Final  Blood culture (routine x 2)     Status: None   Collection Time: 05/14/17  9:13 PM  Result Value Ref Range Status   Specimen Description BLOOD BLOOD LEFT HAND  Final   Special Requests IN PEDIATRIC BOTTLE Blood Culture adequate volume  Final   Culture   Final    NO GROWTH 5 DAYS Performed at [facility], [place] [address], [place], [place] [address]    Report Status 05/20/2017 FINAL  Final  Blood culture (routine x 2)     Status: None   Collection Time: 05/14/17 10:20 PM  Result Value Ref Range Status   Specimen Description BLOOD RIGHT ARM  Final   Special Requests IN PEDIATRIC BOTTLE Blood Culture adequate volume  Final   Culture   Final    NO GROWTH 5 DAYS Performed at [facility], [place] [address], [place], [place] [address]    Report Status 05/20/2017 FINAL  Final     Time coordinating discharge: 45 minutes  SIGNED:  Marzetta Board, MD  [facility] 05/23/2017, 12:43 PM Pager [phone]  If 7PM-7AM, please contact night-coverage [email or web address] Password [email or web address]

## 2017-05-23 NOTE — Progress Notes (Signed)
Report called to [facility]. Patient transported to via [place].

## 2017-05-24 ENCOUNTER — Other Ambulatory Visit: Payer: Self-pay | Admitting: Oncology

## 2017-05-24 LAB — CANCER ANTIGEN 27.29: CA 27.29: 523.3 U/mL — ABNORMAL HIGH (ref 0.0–38.6)

## 2017-05-27 ENCOUNTER — Other Ambulatory Visit: Payer: Medicare Other

## 2017-05-27 ENCOUNTER — Ambulatory Visit: Payer: Medicare Other | Admitting: Oncology

## 2017-05-27 ENCOUNTER — Ambulatory Visit: Payer: Medicare Other

## 2017-05-28 ENCOUNTER — Ambulatory Visit (HOSPITAL_BASED_OUTPATIENT_CLINIC_OR_DEPARTMENT_OTHER): Payer: Medicare Other

## 2017-05-28 VITALS — BP 174/68 | HR 80 | Temp 99.0°F | Resp 18

## 2017-05-28 DIAGNOSIS — C50412 Malignant neoplasm of upper-outer quadrant of left female breast: Secondary | ICD-10-CM | POA: Diagnosis not present

## 2017-05-28 DIAGNOSIS — Z5111 Encounter for antineoplastic chemotherapy: Secondary | ICD-10-CM | POA: Diagnosis present

## 2017-05-28 DIAGNOSIS — Z170 Estrogen receptor positive status [ER+]: Principal | ICD-10-CM

## 2017-05-28 MED ORDER — FULVESTRANT 250 MG/5ML IM SOLN
500.0000 mg | Freq: Once | INTRAMUSCULAR | Status: AC
  Administered 2017-05-28: 500 mg via INTRAMUSCULAR
  Filled 2017-05-28: qty 10

## 2017-05-28 NOTE — Patient Instructions (Signed)

## 2017-06-19 MED FILL — Metoprolol Tartrate IV Soln 5 MG/5ML: INTRAVENOUS | Qty: 2.5 | Status: AC

## 2017-06-24 ENCOUNTER — Ambulatory Visit: Payer: Medicare Other

## 2017-06-24 ENCOUNTER — Other Ambulatory Visit: Payer: Medicare Other

## 2017-06-24 MED ORDER — FULVESTRANT 250 MG/5ML IM SOLN
500.0000 mg | Freq: Once | INTRAMUSCULAR | Status: AC
  Filled 2017-06-24: qty 10

## 2017-06-26 ENCOUNTER — Ambulatory Visit (HOSPITAL_BASED_OUTPATIENT_CLINIC_OR_DEPARTMENT_OTHER): Payer: Medicare Other

## 2017-06-26 ENCOUNTER — Other Ambulatory Visit (HOSPITAL_BASED_OUTPATIENT_CLINIC_OR_DEPARTMENT_OTHER): Payer: Medicare Other

## 2017-06-26 DIAGNOSIS — C50412 Malignant neoplasm of upper-outer quadrant of left female breast: Secondary | ICD-10-CM

## 2017-06-26 DIAGNOSIS — C7951 Secondary malignant neoplasm of bone: Secondary | ICD-10-CM | POA: Diagnosis not present

## 2017-06-26 DIAGNOSIS — Z5111 Encounter for antineoplastic chemotherapy: Secondary | ICD-10-CM | POA: Diagnosis present

## 2017-06-26 DIAGNOSIS — Z170 Estrogen receptor positive status [ER+]: Principal | ICD-10-CM

## 2017-06-26 LAB — COMPREHENSIVE METABOLIC PANEL
ALBUMIN: 2.6 g/dL — AB (ref 3.5–5.0)
ALK PHOS: 279 U/L — AB (ref 40–150)
ALT: 16 U/L (ref 0–55)
ANION GAP: 11 meq/L (ref 3–11)
AST: 39 U/L — ABNORMAL HIGH (ref 5–34)
BILIRUBIN TOTAL: 0.58 mg/dL (ref 0.20–1.20)
BUN: 6.8 mg/dL — ABNORMAL LOW (ref 7.0–26.0)
CALCIUM: 9.2 mg/dL (ref 8.4–10.4)
CO2: 21 mEq/L — ABNORMAL LOW (ref 22–29)
Chloride: 110 mEq/L — ABNORMAL HIGH (ref 98–109)
Creatinine: 1 mg/dL (ref 0.6–1.1)
EGFR: 57 mL/min/{1.73_m2} — AB (ref >90)
GLUCOSE: 118 mg/dL (ref 70–140)
POTASSIUM: 3.6 meq/L (ref 3.5–5.1)
SODIUM: 142 meq/L (ref 136–145)
TOTAL PROTEIN: 6.7 g/dL (ref 6.4–8.3)

## 2017-06-26 LAB — CBC WITH DIFFERENTIAL/PLATELET
BASO%: 0.3 % (ref 0.0–2.0)
BASOS ABS: 0 10*3/uL (ref 0.0–0.1)
EOS ABS: 0.3 10*3/uL (ref 0.0–0.5)
EOS%: 2.2 % (ref 0.0–7.0)
HEMATOCRIT: 29.9 % — AB (ref 34.8–46.6)
HEMOGLOBIN: 9.4 g/dL — AB (ref 11.6–15.9)
LYMPH#: 3 10*3/uL (ref 0.9–3.3)
LYMPH%: 25.4 % (ref 14.0–49.7)
MCH: 29.3 pg (ref 25.1–34.0)
MCHC: 31.4 g/dL — ABNORMAL LOW (ref 31.5–36.0)
MCV: 93.1 fL (ref 79.5–101.0)
MONO#: 0.5 10*3/uL (ref 0.1–0.9)
MONO%: 3.9 % (ref 0.0–14.0)
NEUT#: 8.2 10*3/uL — ABNORMAL HIGH (ref 1.5–6.5)
NEUT%: 68.2 % (ref 38.4–76.8)
NRBC: 0 % (ref 0–0)
PLATELETS: 382 10*3/uL (ref 145–400)
RBC: 3.21 10*6/uL — ABNORMAL LOW (ref 3.70–5.45)
RDW: 18.9 % — AB (ref 11.2–14.5)
WBC: 12 10*3/uL — ABNORMAL HIGH (ref 3.9–10.3)

## 2017-06-26 MED ORDER — FULVESTRANT 250 MG/5ML IM SOLN
500.0000 mg | Freq: Once | INTRAMUSCULAR | Status: AC
  Administered 2017-06-26: 500 mg via INTRAMUSCULAR
  Filled 2017-06-26: qty 10

## 2017-06-26 MED ORDER — DENOSUMAB 120 MG/1.7ML ~~LOC~~ SOLN
120.0000 mg | Freq: Once | SUBCUTANEOUS | Status: AC
  Administered 2017-06-26: 120 mg via SUBCUTANEOUS
  Filled 2017-06-26: qty 1.7

## 2017-06-27 ENCOUNTER — Other Ambulatory Visit: Payer: Self-pay | Admitting: *Deleted

## 2017-06-27 MED ORDER — OXYCODONE HCL 5 MG PO TABS: 5.0000 mg | ORAL_TABLET | Freq: Four times a day (QID) | ORAL | 0 refills | Status: DC | PRN

## 2017-07-15 ENCOUNTER — Other Ambulatory Visit: Payer: Self-pay | Admitting: *Deleted

## 2017-07-15 ENCOUNTER — Telehealth: Payer: Self-pay | Admitting: *Deleted

## 2017-07-15 DIAGNOSIS — C50412 Malignant neoplasm of upper-outer quadrant of left female breast: Secondary | ICD-10-CM

## 2017-07-15 DIAGNOSIS — Z170 Estrogen receptor positive status [ER+]: Principal | ICD-10-CM

## 2017-07-15 MED ORDER — ANASTROZOLE 1 MG PO TABS: 1.0000 mg | ORAL_TABLET | Freq: Every day | ORAL | 3 refills | Status: DC

## 2017-07-15 NOTE — Telephone Encounter (Signed)
Verified with MD pt is to be on faslodex and anastrozole.  Prescription sent to pt's pharmacy

## 2017-07-15 NOTE — Telephone Encounter (Signed)
Received vm request from pt to refill her anastrozole.  She states she has 5-6 left.  Call back # is [phone].  Message to Dr Magrinat/Pod RN

## 2017-07-22 ENCOUNTER — Other Ambulatory Visit (HOSPITAL_BASED_OUTPATIENT_CLINIC_OR_DEPARTMENT_OTHER): Payer: Medicare Other

## 2017-07-22 ENCOUNTER — Telehealth: Payer: Self-pay

## 2017-07-22 ENCOUNTER — Ambulatory Visit (HOSPITAL_BASED_OUTPATIENT_CLINIC_OR_DEPARTMENT_OTHER): Payer: Medicare Other

## 2017-07-22 ENCOUNTER — Other Ambulatory Visit: Payer: Self-pay | Admitting: Hematology and Oncology

## 2017-07-22 ENCOUNTER — Other Ambulatory Visit: Payer: Self-pay | Admitting: *Deleted

## 2017-07-22 DIAGNOSIS — Z5111 Encounter for antineoplastic chemotherapy: Secondary | ICD-10-CM

## 2017-07-22 DIAGNOSIS — Z170 Estrogen receptor positive status [ER+]: Principal | ICD-10-CM

## 2017-07-22 DIAGNOSIS — C50412 Malignant neoplasm of upper-outer quadrant of left female breast: Secondary | ICD-10-CM | POA: Diagnosis present

## 2017-07-22 LAB — CBC WITH DIFFERENTIAL/PLATELET
BASO%: 1.1 % (ref 0.0–2.0)
Basophils Absolute: 0.1 10*3/uL (ref 0.0–0.1)
EOS ABS: 0.1 10*3/uL (ref 0.0–0.5)
EOS%: 0.7 % (ref 0.0–7.0)
HCT: 30.6 % — ABNORMAL LOW (ref 34.8–46.6)
HEMOGLOBIN: 9.8 g/dL — AB (ref 11.6–15.9)
LYMPH%: 21 % (ref 14.0–49.7)
MCH: 29.6 pg (ref 25.1–34.0)
MCHC: 31.8 g/dL (ref 31.5–36.0)
MCV: 92.9 fL (ref 79.5–101.0)
MONO#: 0.5 10*3/uL (ref 0.1–0.9)
MONO%: 4.9 % (ref 0.0–14.0)
NEUT%: 72.3 % (ref 38.4–76.8)
NEUTROS ABS: 8 10*3/uL — AB (ref 1.5–6.5)
PLATELETS: 319 10*3/uL (ref 145–400)
RBC: 3.3 10*6/uL — ABNORMAL LOW (ref 3.70–5.45)
RDW: 19.6 % — AB (ref 11.2–14.5)
WBC: 11.1 10*3/uL — AB (ref 3.9–10.3)
lymph#: 2.3 10*3/uL (ref 0.9–3.3)

## 2017-07-22 LAB — COMPREHENSIVE METABOLIC PANEL WITH GFR
ALT: 20 U/L (ref 0–55)
AST: 47 U/L — ABNORMAL HIGH (ref 5–34)
Albumin: 2.7 g/dL — ABNORMAL LOW (ref 3.5–5.0)
Alkaline Phosphatase: 168 U/L — ABNORMAL HIGH (ref 40–150)
Anion Gap: 24 meq/L — ABNORMAL HIGH (ref 3–11)
BUN: 9.7 mg/dL (ref 7.0–26.0)
CO2: 14 meq/L — ABNORMAL LOW (ref 22–29)
Calcium: 6.6 mg/dL — ABNORMAL LOW (ref 8.4–10.4)
Chloride: 107 meq/L (ref 98–109)
Creatinine: 1.1 mg/dL (ref 0.6–1.1)
EGFR: 49 ml/min/1.73 m2 — ABNORMAL LOW
Glucose: 73 mg/dL (ref 70–140)
Potassium: 3 meq/L — CL (ref 3.5–5.1)
Sodium: 144 meq/L (ref 136–145)
Total Bilirubin: 0.88 mg/dL (ref 0.20–1.20)
Total Protein: 6.9 g/dL (ref 6.4–8.3)

## 2017-07-22 MED ORDER — FULVESTRANT 250 MG/5ML IM SOLN
500.0000 mg | Freq: Once | INTRAMUSCULAR | Status: AC
  Administered 2017-07-22: 500 mg via INTRAMUSCULAR
  Filled 2017-07-22: qty 10

## 2017-07-22 MED ORDER — DENOSUMAB 120 MG/1.7ML ~~LOC~~ SOLN: 120.0000 mg | Freq: Once | SUBCUTANEOUS | Status: DC

## 2017-07-22 MED ORDER — OXYCODONE HCL 5 MG PO TABS: 5.0000 mg | ORAL_TABLET | Freq: Four times a day (QID) | ORAL | 0 refills | Status: DC | PRN

## 2017-07-22 MED ORDER — PROCHLORPERAZINE MALEATE 10 MG PO TABS: 10.0000 mg | ORAL_TABLET | Freq: Four times a day (QID) | ORAL | 0 refills | Status: DC | PRN

## 2017-07-22 MED ORDER — PROCHLORPERAZINE MALEATE 10 MG PO TABS: 10.0000 mg | ORAL_TABLET | Freq: Four times a day (QID) | ORAL | 1 refills | Status: DC | PRN

## 2017-07-22 MED ORDER — POTASSIUM CHLORIDE 20 MEQ/15ML (10%) PO SOLN: 20.0000 meq | Freq: Every day | ORAL | 0 refills | Status: DC

## 2017-07-22 MED ORDER — ANASTROZOLE 1 MG PO TABS: 1.0000 mg | ORAL_TABLET | Freq: Every day | ORAL | 3 refills | Status: DC

## 2017-07-22 NOTE — Progress Notes (Signed)
Landry Mellow held per Dr. Lindi Adie d/t low calcium level.

## 2017-07-22 NOTE — Telephone Encounter (Signed)
Pt at [facility] for faslodex injection. She filled out walk in form for vomiting x 3 days, zofran not working.She only used 1 zofran yesterday and said "this doesn't work" and similar on the 2 days before. She vomits when she drinks cold beverages. She is able to keep hot broth down. She has no pain, no fever. S/w Dr Lindi Adie and will rx compazine. Discussed with pt to take compazine (or zofran) when she gets nauseated and don't wait until she throws up. She can take up to 4 tabs/day.  Daughter also stated she is having difficulty with pharmacy to get the anastrozole, requested a new rx be sent to [facility] on [address]. This done. Compazine rx sent. Instructed family to call if nausea does not clear up, d/t possibility of dehydration. She is sipping on fluids at present. Will also send in basket for OV with Mendel Ryder on friday d/t cancelled 6/5 MD appt without rescheduling.  K+ was 3.0 s/w Dr Lindi Adie and order K+ liquid.

## 2017-07-22 NOTE — Patient Instructions (Addendum)

## 2017-07-23 ENCOUNTER — Telehealth: Payer: Self-pay | Admitting: Hematology and Oncology

## 2017-07-23 NOTE — Telephone Encounter (Signed)
sw pt daughter to confirm 8/3 appt per sch msg

## 2017-07-24 ENCOUNTER — Telehealth: Payer: Self-pay | Admitting: *Deleted

## 2017-07-24 NOTE — Telephone Encounter (Signed)
She is scheduled at 10am with Lindsey,NP.

## 2017-07-24 NOTE — Telephone Encounter (Signed)
TC from pt's daughter stating the her mother is still experiencing nausea and vomiting despite taking new prescription for compazine every 6 hours, alternating with zofran 4 mg ODT every 4 hours (given by another MD , not from cancer center). She has had no relief. She has been taking only sips of liquids. Often vomits that back up.  Denies fever, chills, pain, diarrhea. Last BM was 07/22/17.  Scheduled to come in to see Thedore Mins, NP tomorrow afternoon.  Spoke with Ria Comment, NP.  Per Lindsay-pt can either come to cancer center by 2:30 pm today, go to ED or come in to cancer center tomorrow morning instead of tomorrow afternoon.   Called back to daughter and reviewed the above options.  Daughter states she cannot get transportation today. Her mother does not want to go to ED. Daughter states they will come in tomorrow morning for labs and see Ria Comment, NP  Ria Comment made aware.  High priority LOS sent for morning appts.

## 2017-07-25 ENCOUNTER — Encounter: Payer: Self-pay | Admitting: Adult Health

## 2017-07-25 ENCOUNTER — Encounter: Payer: Self-pay | Admitting: Oncology

## 2017-07-25 ENCOUNTER — Other Ambulatory Visit (HOSPITAL_BASED_OUTPATIENT_CLINIC_OR_DEPARTMENT_OTHER): Payer: Medicare Other

## 2017-07-25 ENCOUNTER — Ambulatory Visit (HOSPITAL_BASED_OUTPATIENT_CLINIC_OR_DEPARTMENT_OTHER): Payer: Medicare Other | Admitting: Adult Health

## 2017-07-25 ENCOUNTER — Encounter (HOSPITAL_COMMUNITY): Payer: Self-pay | Admitting: *Deleted

## 2017-07-25 ENCOUNTER — Inpatient Hospital Stay (HOSPITAL_COMMUNITY)
Admission: EM | Admit: 2017-07-25 | Discharge: 2017-07-28 | DRG: 641 | Disposition: A | Discharge status: Hospice | Payer: Medicare Other | Attending: Internal Medicine | Admitting: Internal Medicine

## 2017-07-25 ENCOUNTER — Observation Stay (HOSPITAL_COMMUNITY): Payer: Medicare Other

## 2017-07-25 VITALS — BP 95/43 | HR 86 | Temp 97.4°F | Resp 16 | Ht 61.0 in

## 2017-07-25 DIAGNOSIS — Z90710 Acquired absence of both cervix and uterus: Secondary | ICD-10-CM

## 2017-07-25 DIAGNOSIS — E860 Dehydration: Secondary | ICD-10-CM | POA: Diagnosis not present

## 2017-07-25 DIAGNOSIS — C50412 Malignant neoplasm of upper-outer quadrant of left female breast: Secondary | ICD-10-CM

## 2017-07-25 DIAGNOSIS — C7800 Secondary malignant neoplasm of unspecified lung: Secondary | ICD-10-CM

## 2017-07-25 DIAGNOSIS — C7951 Secondary malignant neoplasm of bone: Secondary | ICD-10-CM | POA: Diagnosis not present

## 2017-07-25 DIAGNOSIS — N184 Chronic kidney disease, stage 4 (severe): Secondary | ICD-10-CM | POA: Diagnosis present

## 2017-07-25 DIAGNOSIS — Z170 Estrogen receptor positive status [ER+]: Secondary | ICD-10-CM

## 2017-07-25 DIAGNOSIS — D638 Anemia in other chronic diseases classified elsewhere: Secondary | ICD-10-CM | POA: Diagnosis present

## 2017-07-25 DIAGNOSIS — K219 Gastro-esophageal reflux disease without esophagitis: Secondary | ICD-10-CM | POA: Diagnosis present

## 2017-07-25 DIAGNOSIS — I10 Essential (primary) hypertension: Secondary | ICD-10-CM | POA: Diagnosis present

## 2017-07-25 DIAGNOSIS — E872 Acidosis: Secondary | ICD-10-CM | POA: Diagnosis present

## 2017-07-25 DIAGNOSIS — M1990 Unspecified osteoarthritis, unspecified site: Secondary | ICD-10-CM | POA: Diagnosis present

## 2017-07-25 DIAGNOSIS — Z9049 Acquired absence of other specified parts of digestive tract: Secondary | ICD-10-CM

## 2017-07-25 DIAGNOSIS — E870 Hyperosmolality and hypernatremia: Secondary | ICD-10-CM | POA: Diagnosis present

## 2017-07-25 DIAGNOSIS — E876 Hypokalemia: Secondary | ICD-10-CM | POA: Diagnosis present

## 2017-07-25 DIAGNOSIS — Z803 Family history of malignant neoplasm of breast: Secondary | ICD-10-CM

## 2017-07-25 DIAGNOSIS — C50919 Malignant neoplasm of unspecified site of unspecified female breast: Secondary | ICD-10-CM | POA: Diagnosis present

## 2017-07-25 DIAGNOSIS — Z8249 Family history of ischemic heart disease and other diseases of the circulatory system: Secondary | ICD-10-CM

## 2017-07-25 DIAGNOSIS — C7972 Secondary malignant neoplasm of left adrenal gland: Secondary | ICD-10-CM | POA: Diagnosis not present

## 2017-07-25 DIAGNOSIS — Z66 Do not resuscitate: Secondary | ICD-10-CM | POA: Diagnosis present

## 2017-07-25 DIAGNOSIS — F17210 Nicotine dependence, cigarettes, uncomplicated: Secondary | ICD-10-CM | POA: Diagnosis present

## 2017-07-25 DIAGNOSIS — C787 Secondary malignant neoplasm of liver and intrahepatic bile duct: Secondary | ICD-10-CM

## 2017-07-25 DIAGNOSIS — I252 Old myocardial infarction: Secondary | ICD-10-CM

## 2017-07-25 DIAGNOSIS — R112 Nausea with vomiting, unspecified: Secondary | ICD-10-CM | POA: Diagnosis present

## 2017-07-25 DIAGNOSIS — Z515 Encounter for palliative care: Secondary | ICD-10-CM | POA: Diagnosis not present

## 2017-07-25 DIAGNOSIS — I129 Hypertensive chronic kidney disease with stage 1 through stage 4 chronic kidney disease, or unspecified chronic kidney disease: Secondary | ICD-10-CM | POA: Diagnosis present

## 2017-07-25 DIAGNOSIS — N19 Unspecified kidney failure: Secondary | ICD-10-CM

## 2017-07-25 DIAGNOSIS — N390 Urinary tract infection, site not specified: Secondary | ICD-10-CM | POA: Diagnosis present

## 2017-07-25 DIAGNOSIS — D509 Iron deficiency anemia, unspecified: Secondary | ICD-10-CM | POA: Diagnosis present

## 2017-07-25 DIAGNOSIS — K56609 Unspecified intestinal obstruction, unspecified as to partial versus complete obstruction: Secondary | ICD-10-CM

## 2017-07-25 LAB — CBC WITH DIFFERENTIAL/PLATELET
BASO%: 0.3 % (ref 0.0–2.0)
Basophils Absolute: 0 10*3/uL (ref 0.0–0.1)
Basophils Absolute: 0.1 10*3/uL (ref 0.0–0.1)
Basophils Relative: 1 %
EOS%: 0.9 % (ref 0.0–7.0)
Eosinophils Absolute: 0.1 10*3/uL (ref 0.0–0.5)
Eosinophils Absolute: 0.1 10*3/uL (ref 0.0–0.7)
Eosinophils Relative: 1 %
HCT: 26.8 % — ABNORMAL LOW (ref 36.0–46.0)
HEMATOCRIT: 27 % — AB (ref 34.8–46.6)
HEMOGLOBIN: 8.9 g/dL — AB (ref 12.0–15.0)
HGB: 8.6 g/dL — ABNORMAL LOW (ref 11.6–15.9)
LYMPH#: 2.1 10*3/uL (ref 0.9–3.3)
LYMPH%: 21.7 % (ref 14.0–49.7)
LYMPHS ABS: 2.7 10*3/uL (ref 0.7–4.0)
Lymphocytes Relative: 22 %
MCH: 28.9 pg (ref 25.1–34.0)
MCH: 29.9 pg (ref 26.0–34.0)
MCHC: 31.9 g/dL (ref 31.5–36.0)
MCHC: 33.2 g/dL (ref 30.0–36.0)
MCV: 90.6 fL (ref 79.5–101.0)
MCV: 89.9 fL (ref 78.0–100.0)
MONO ABS: 0.6 10*3/uL (ref 0.1–1.0)
MONO#: 0.4 10*3/uL (ref 0.1–0.9)
MONO%: 4.3 % (ref 0.0–14.0)
MONOS PCT: 5 %
NEUT%: 72.8 % (ref 38.4–76.8)
NEUTROS ABS: 7 10*3/uL — AB (ref 1.5–6.5)
NEUTROS PCT: 71 %
NRBC: 1 % — AB (ref 0–0)
Neutro Abs: 8.8 10*3/uL — ABNORMAL HIGH (ref 1.7–7.7)
Platelets: 303 10*3/uL (ref 145–400)
Platelets: 318 10*3/uL (ref 150–400)
RBC: 2.98 10*6/uL — ABNORMAL LOW (ref 3.70–5.45)
RBC: 2.98 MIL/uL — ABNORMAL LOW (ref 3.87–5.11)
RDW: 19.8 % — ABNORMAL HIGH (ref 11.2–14.5)
RDW: 20.2 % — AB (ref 11.5–15.5)
WBC: 9.6 10*3/uL (ref 3.9–10.3)
WBC: 12.2 10*3/uL — ABNORMAL HIGH (ref 4.0–10.5)

## 2017-07-25 LAB — COMPREHENSIVE METABOLIC PANEL
ALK PHOS: 137 U/L — AB (ref 38–126)
ALT: 18 U/L (ref 0–55)
ALT: 21 U/L (ref 14–54)
ANION GAP: 24 — AB (ref 5–15)
AST: 40 U/L — AB (ref 5–34)
AST: 43 U/L — ABNORMAL HIGH (ref 15–41)
Albumin: 2.6 g/dL — ABNORMAL LOW (ref 3.5–5.0)
Albumin: 2.9 g/dL — ABNORMAL LOW (ref 3.5–5.0)
Alkaline Phosphatase: 146 U/L (ref 40–150)
Anion Gap: 22 mEq/L — ABNORMAL HIGH (ref 3–11)
BILIRUBIN TOTAL: 0.81 mg/dL (ref 0.20–1.20)
BILIRUBIN TOTAL: 1.9 mg/dL — AB (ref 0.3–1.2)
BUN: 16.3 mg/dL (ref 7.0–26.0)
BUN: 17 mg/dL (ref 6–20)
CALCIUM: 6.8 mg/dL — AB (ref 8.4–10.4)
CALCIUM: 6.6 mg/dL — AB (ref 8.9–10.3)
CHLORIDE: 113 meq/L — AB (ref 98–109)
CO2: 15 meq/L — AB (ref 22–29)
CO2: 15 mmol/L — ABNORMAL LOW (ref 22–32)
CREATININE: 1.5 mg/dL — AB (ref 0.6–1.1)
Chloride: 109 mmol/L (ref 101–111)
Creatinine, Ser: 1.49 mg/dL — ABNORMAL HIGH (ref 0.44–1.00)
EGFR: 36 mL/min/{1.73_m2} — ABNORMAL LOW (ref >90)
GFR calc non Af Amer: 30 mL/min — ABNORMAL LOW (ref >60)
GFR, EST AFRICAN AMERICAN: 35 mL/min — AB (ref >60)
Glucose, Bld: 113 mg/dL — ABNORMAL HIGH (ref 65–99)
Glucose: 112 mg/dl (ref 70–140)
POTASSIUM: 2.8 mmol/L — AB (ref 3.5–5.1)
Potassium: 2.9 mEq/L — CL (ref 3.5–5.1)
SODIUM: 148 mmol/L — AB (ref 135–145)
Sodium: 150 mEq/L — ABNORMAL HIGH (ref 136–145)
TOTAL PROTEIN: 6.7 g/dL (ref 6.4–8.3)
Total Protein: 7 g/dL (ref 6.5–8.1)

## 2017-07-25 LAB — LIPASE, BLOOD: Lipase: 16 U/L (ref 11–51)

## 2017-07-25 LAB — TECHNOLOGIST REVIEW

## 2017-07-25 LAB — LACTIC ACID, PLASMA
Lactic Acid, Venous: 2 mmol/L (ref 0.5–1.9)
Lactic Acid, Venous: 2.3 mmol/L (ref 0.5–1.9)

## 2017-07-25 LAB — URINALYSIS, ROUTINE W REFLEX MICROSCOPIC
Bacteria, UA: NONE SEEN
Bilirubin Urine: NEGATIVE
GLUCOSE, UA: NEGATIVE mg/dL
HGB URINE DIPSTICK: NEGATIVE
Ketones, ur: 20 mg/dL — AB
LEUKOCYTES UA: NEGATIVE
NITRITE: NEGATIVE
PH: 5 (ref 5.0–8.0)
Protein, ur: 30 mg/dL — AB
Specific Gravity, Urine: 1.015 (ref 1.005–1.030)

## 2017-07-25 LAB — MAGNESIUM: Magnesium: 1.5 mg/dL — ABNORMAL LOW (ref 1.7–2.4)

## 2017-07-25 MED ORDER — ORAL CARE MOUTH RINSE
15.0000 mL | Freq: Two times a day (BID) | OROMUCOSAL | Status: DC
  Administered 2017-07-26 – 2017-07-27 (×3): 15 mL via OROMUCOSAL

## 2017-07-25 MED ORDER — POTASSIUM CHLORIDE IN NACL 40-0.9 MEQ/L-% IV SOLN
INTRAVENOUS | Status: DC
  Administered 2017-07-25 – 2017-07-26 (×2): 125 mL/h via INTRAVENOUS
  Filled 2017-07-25 (×3): qty 1000

## 2017-07-25 MED ORDER — OXYCODONE HCL 5 MG PO TABS: 5.0000 mg | ORAL_TABLET | Freq: Four times a day (QID) | ORAL | Status: DC | PRN

## 2017-07-25 MED ORDER — SODIUM CHLORIDE 0.9 % IV BOLUS (SEPSIS)
1000.0000 mL | Freq: Once | INTRAVENOUS | Status: AC
  Administered 2017-07-25: 1000 mL via INTRAVENOUS

## 2017-07-25 MED ORDER — ACETAMINOPHEN 650 MG RE SUPP: 650.0000 mg | Freq: Four times a day (QID) | RECTAL | Status: DC | PRN

## 2017-07-25 MED ORDER — POTASSIUM CHLORIDE 10 MEQ/100ML IV SOLN: 10.0000 meq | INTRAVENOUS | Status: DC

## 2017-07-25 MED ORDER — BOOST / RESOURCE BREEZE PO LIQD
1.0000 | Freq: Three times a day (TID) | ORAL | Status: DC
  Administered 2017-07-26 – 2017-07-27 (×2): 1 via ORAL

## 2017-07-25 MED ORDER — POTASSIUM CHLORIDE 10 MEQ/100ML IV SOLN
10.0000 meq | INTRAVENOUS | Status: AC
  Administered 2017-07-25 (×3): 10 meq via INTRAVENOUS

## 2017-07-25 MED ORDER — POTASSIUM CHLORIDE 10 MEQ/100ML IV SOLN
10.0000 meq | INTRAVENOUS | Status: DC
  Filled 2017-07-25 (×3): qty 100

## 2017-07-25 MED ORDER — ENOXAPARIN SODIUM 30 MG/0.3ML ~~LOC~~ SOLN
30.0000 mg | SUBCUTANEOUS | Status: DC
  Administered 2017-07-25 – 2017-07-26 (×2): 30 mg via SUBCUTANEOUS
  Filled 2017-07-25 (×2): qty 0.3

## 2017-07-25 MED ORDER — DEXTROSE 5 % IV SOLN
1.0000 g | INTRAVENOUS | Status: DC
  Administered 2017-07-25: 1 g via INTRAVENOUS
  Filled 2017-07-25 (×2): qty 10

## 2017-07-25 MED ORDER — POTASSIUM CHLORIDE 10 MEQ/100ML IV SOLN
10.0000 meq | INTRAVENOUS | Status: AC
  Administered 2017-07-25 (×3): 10 meq via INTRAVENOUS
  Filled 2017-07-25 (×3): qty 100

## 2017-07-25 MED ORDER — ONDANSETRON HCL 4 MG/2ML IJ SOLN
4.0000 mg | Freq: Four times a day (QID) | INTRAMUSCULAR | Status: DC | PRN
  Administered 2017-07-26 (×3): 4 mg via INTRAVENOUS
  Filled 2017-07-25 (×3): qty 2

## 2017-07-25 MED ORDER — ONDANSETRON HCL 4 MG/2ML IJ SOLN
4.0000 mg | Freq: Once | INTRAMUSCULAR | Status: AC
  Administered 2017-07-25: 4 mg via INTRAVENOUS
  Filled 2017-07-25: qty 2

## 2017-07-25 MED ORDER — MORPHINE SULFATE (PF) 4 MG/ML IV SOLN
4.0000 mg | Freq: Once | INTRAVENOUS | Status: AC
  Administered 2017-07-25: 4 mg via INTRAVENOUS
  Filled 2017-07-25: qty 1

## 2017-07-25 MED ORDER — SODIUM CHLORIDE 0.9 % IV BOLUS (SEPSIS)
500.0000 mL | Freq: Once | INTRAVENOUS | Status: AC
  Administered 2017-07-25: 500 mL via INTRAVENOUS

## 2017-07-25 MED ORDER — MAGNESIUM SULFATE 2 GM/50ML IV SOLN
2.0000 g | Freq: Once | INTRAVENOUS | Status: AC
  Administered 2017-07-25: 2 g via INTRAVENOUS
  Filled 2017-07-25: qty 50

## 2017-07-25 MED ORDER — ACETAMINOPHEN 325 MG PO TABS: 650.0000 mg | ORAL_TABLET | Freq: Four times a day (QID) | ORAL | Status: DC | PRN

## 2017-07-25 MED ORDER — PROMETHAZINE HCL 25 MG/ML IJ SOLN
12.5000 mg | Freq: Once | INTRAMUSCULAR | Status: AC
  Administered 2017-07-25: 12.5 mg via INTRAVENOUS
  Filled 2017-07-25: qty 1

## 2017-07-25 MED ORDER — CHLORHEXIDINE GLUCONATE 0.12 % MT SOLN
15.0000 mL | Freq: Two times a day (BID) | OROMUCOSAL | Status: DC
  Administered 2017-07-26 – 2017-07-27 (×4): 15 mL via OROMUCOSAL
  Filled 2017-07-25 (×4): qty 15

## 2017-07-25 NOTE — ED Triage Notes (Addendum)
Pt brought from cancer center for dehydration. Cancer center couldn't obtain vascular access. Pt reports emesis and diarrhea x 1.5 weeks. No blood in emesis or stool. Pt reports left breast began hurting again today for first time since breast cancer diagnosis in April. No current chemotherapy or radiation therapy, pt does take oral medication for her cancer but doesn't remember. Chemistry profile and CBC with differential drawn at cancer center today, results in EPIC. Hypokalemia.

## 2017-07-25 NOTE — Progress Notes (Signed)
CRITICAL VALUE ALERT  Critical Value:  Lactic acid 2.0  Date & Time Notied:  07/25/17 2010  Provider Notified: Hal Hope  Orders Received/Actions taken: continue to monitor and cycle

## 2017-07-25 NOTE — Progress Notes (Signed)
CRITICAL VALUE ALERT  Critical Value:  Lactic acid 2.3  Date & Time Notied:  07/25/17 2330  Provider Notified: Hal Hope  Orders Received/Actions taken: 500 cc bolus

## 2017-07-25 NOTE — Progress Notes (Signed)
[facility]  [phone]) [phone] [phone]) [phone]     ID: Samantha Abbott DOB: Apr 10, 1925  MR#: [id number]  JYN#:[id number]  Patient Care Team: Benito Mccreedy, MD as PCP - General (Internal Medicine) Magrinat, Virgie Dad, MD as Consulting Physician (Oncology) Johnathan Hausen, MD as Consulting Physician (General Surgery) Scot Dock, NP OTHER MD:  CHIEF COMPLAINT: Estrogen receptor positive breast cancer  CURRENT TREATMENT: anastrozole, fulvestrant, palbociclib   BREAST CANCER HISTORY: The patient had mammography 02/14/2015 showing a small mass in the medial left breast. She refused biopsy at that time. More recently the patient noted a worsening lump in the left breast, with skin thickening and superficial nodules, as well as a lump in her left axilla. When this became painful she brought it to medical attention and on 04/04/2017 underwent bilateral diagnostic mammography with tomography and left sided ultrasonography. The breast density was category B.  In the upper-outer quadrant of the left breast there was a spiculated mass corresponding to the patient's palpable lump. This is not exactly where the mass had previously been noted in 2016. Exam showed diffuse peau d'orange of the left breast with skin nodules in the far upper outer quadrant firm to palpation. There was palpable lymphadenopathy in the inferior and medial left axilla.  Ultrasound of the left breast confirmed an irregular hypoechoic mass at the 2:00 position 12 cm from the nipple measuring 3.9 cm, and in the region of visible skin nodules there were some hypoechoic areas at the 2:30 o'clock position near the midaxillary line. Ultrasound of the lower inner quadrant of the left breast found a 0.6 cm mass at the 8:00 position 4 cm from the nipple. In the lower left axilla there was a 1.0 cm mass suspicious for metastatic spread.  In the upper inner quadrant of the right breast there was a group  of calcifications spanning 2.9 cm, which had been previously recommended by biopsy, which the patient refused. There was no mass or architectural distortion on the right ultrasound of the right breast and right axilla were otherwise negative.  On 04/08/2017 the patient underwent biopsy of the main mass in the left breast at 2:00, and left axillary lymph node, and the area of interest in the upper inner quadrant of the right breast. The left breast mass and lymph node both showed invasive ductal carcinoma, E-cadherin positive, with the prognostic profile from the breast mass showing the tumor to be estrogen receptor 100% positive with strong staining intensity, progesterone receptor 10% positive with moderate staining intensity, with an MIB-1 of 60%, and no HER-2 amplification, the signals ratio being 1.18 and the number per cell 1.30. 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   The patient's subsequent history is as detailed below  INTERVAL HISTORY: Samantha Abbott is here today with her daughter Samantha Abbott due to her intractable nausea and vomiting x 14 days.  She has metastatic breast cancer and is currently receiving Anastrozole/Fulvestrant.  She hasn't recently taken Anastrozole due to the nausea/vomiting.  She is having decreased po intake, and today started having diarrhea.  She denies any new pain, headaches, abdominal issues.    REVIEW OF SYSTEMS: Samantha Abbott is dizzy and is to the point where she feels like she needs to go to the Emergency Room.     PAST MEDICAL HISTORY: Past Medical History:  Diagnosis Date  . Abdominal pain   . Abnormal  heart rhythm   . Arthritis   . Breast cancer ([place])    BILATERAL  . Dysrhythmia   . Esophageal ulcer   . Flatulence, eructation, and gas pain   . GERD (gastroesophageal reflux disease)    with esophageal ulcer in  past  . Hypertension    LOV WITH EKG DR [facility] RECEIVED 12/02/11 1530 and placed on chart  . Iron deficiency anemia   . Myocardial infarction [facility])    silent MI 1961/ states anterior wall  . Nausea   . Sinus problem   . Vomiting   . Wears glasses     PAST SURGICAL HISTORY: Past Surgical History:  Procedure Laterality Date  . ABDOMINAL HYSTERECTOMY    . BOWEL RESECTION  03/24/2012   Procedure: SMALL BOWEL RESECTION;  Surgeon: Pedro Earls, MD;  Location: [facility] ORS;  Service: General;  Laterality: N/A;  . BREAST EXCISIONAL BIOPSY Left   . BREAST EXCISIONAL BIOPSY Right   . CHOLECYSTECTOMY    . COLON SURGERY  12/06/11   lap sigmoid colectomy   . COLONOSCOPY    . FOOT SURGERY      FAMILY HISTORY Family History  Problem Relation Age of Onset  . Cancer Mother        breast cancer  . Breast cancer Mother 47  . Heart disease Father        heart attack   The patient's father died from heart disease at age 81. The patient's mother died at age 81 with breast cancer which had been diagnosed a couple of years before. The patient had no brothers or sisters and no family members that she knows of with breast or ovarian cancer  GYNECOLOGIC HISTORY:  No LMP recorded. Patient has had a hysterectomy. Menarche age 83 she is GX P0. She underwent hysterectomy in 1966. She tells me she still has her cervix in place.  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 She is widowed. All her children are adopted. With her lives her son Samantha Abbott was a security guard Samantha Abbott his wife Samantha Abbott who is disabled secondary to Crohn's disease and is the main caregiver in the home, as well as the patient's grandchildren who are 33, 65, and 40 years old. The 2 older ones work for [employer], the younger 1 is helping a disabled person The ptient also has a daughter Samantha Abbott lives in [place]. patient is a member of the local [employer]     ADVANCED  DIRECTIVES: Not in place. At the 04/23/2017 visit the patient was given the appropriate documents to complete and notarize at her discretion    HEALTH MAINTENANCE: Social History  Substance Use Topics  . Smoking status: Current Some Day Smoker    Packs/day: 0.50    Years: 50.00    Types: Cigarettes  . Smokeless tobacco: Never Used  . Alcohol use No     Colonoscopy:  PAP:  Bone density:   No Known Allergies  No current facility-administered medications for this visit.    Current Outpatient Prescriptions  Medication Sig Dispense Refill  . amoxicillin-clavulanate (AUGMENTIN) 500-125 MG tablet Take 1 tablet (500 mg total) by mouth 2 (two) times daily. (Patient not taking: Reported on 07/25/2017)    . anastrozole (ARIMIDEX) 1 MG tablet Take 1 tablet (1 mg total) by mouth daily. 30 tablet 3  . atorvastatin (LIPITOR) 20 MG tablet Take 20 mg by mouth daily.  1  .  feeding supplement, ENSURE ENLIVE, (ENSURE ENLIVE) LIQD Take 237 mLs by mouth 3 (three) times daily between meals. (Patient not taking: Reported on 07/25/2017) 237 mL 12  . fluconazole (DIFLUCAN) 100 MG tablet Take 1 tablet (100 mg total) by mouth daily. (Patient not taking: Reported on 07/25/2017)    . hydrALAZINE (APRESOLINE) 10 MG tablet Take 1 tablet (10 mg total) by mouth 4 (four) times daily. (Patient not taking: Reported on 07/25/2017) 120 tablet 11  . ondansetron (ZOFRAN) 8 MG tablet Take 1 tablet (8 mg total) by mouth every 8 (eight) hours as needed for nausea or vomiting. 30 tablet 1  . oxyCODONE (OXY IR/ROXICODONE) 5 MG immediate release tablet Take 1 tablet (5 mg total) by mouth every 6 (six) hours as needed for severe pain. 60 tablet 0  . polyethylene glycol (MIRALAX / GLYCOLAX) packet Take 17 g by mouth every other day.    . potassium chloride 20 MEQ/15ML (10%) SOLN TAKE 15 ML BY MOUTH DAILY (Patient not taking: Reported on 07/25/2017) 1350 mL 0  . prochlorperazine (COMPAZINE) 10 MG tablet Take 1 tablet (10 mg total) by mouth  every 6 (six) hours as needed for nausea or vomiting. 60 tablet 1  . prochlorperazine (COMPAZINE) 10 MG tablet TAKE 1 TABLET(10 MG) BY MOUTH EVERY 6 HOURS AS NEEDED FOR NAUSEA OR VOMITING (Patient not taking: Reported on 07/25/2017) 385 tablet 0  . rosuvastatin (CRESTOR) 10 MG tablet Take 10 mg by mouth daily.    Marland Kitchen VALSARTAN PO Take by mouth.    . vitamin B-12 (CYANOCOBALAMIN) 1000 MCG tablet Take 1,000 mcg by mouth daily.    . Vitamin D, Cholecalciferol, 1000 units CAPS Take 1 capsule by mouth daily.     Facility-Administered Medications Ordered in Other Visits  Medication Dose Route Frequency Provider Last Rate Last Dose  . fulvestrant (FASLODEX) injection 500 mg  500 mg Intramuscular Once Magrinat, Virgie Dad, MD      . potassium chloride 10 mEq in 100 mL IVPB  10 mEq Intravenous Q1 Hr x 3 Drenda Freeze, MD 100 mL/hr at 07/25/17 1223 10 mEq at 07/25/17 1223  . sodium chloride 0.9 % bolus 1,000 mL  1,000 mL Intravenous Once Drenda Freeze, MD        OBJECTIVE:Older African-American woman examined in a wheelchair Vitals:   07/25/17 1043  BP: (!) 95/43  Pulse: 86  Resp: 16  Temp: (!) 97.4 F (36.3 C)     There is no height or weight on file to calculate BMI.    ECOG FS:1 - Symptomatic but completely ambulatory GENERAL: older woman in wheel chair, falling asleep, appears unwell HEENT:  Sclerae anicteric.  Oropharynx dry. No ulcerations or evidence of oropharyngeal candidiasis. Neck is supple.  NODES:  No cervical, supraclavicular, or axillary lymphadenopathy palpated.  BREAST EXAM:  Deferred. LUNGS:  Clear to auscultation bilaterally.  No wheezes or rhonchi. HEART:  Regular rate and rhythm. No murmur appreciated. ABDOMEN:  Soft, nontender.  Positive, normoactive bowel sounds. No organomegaly palpated. EXTREMITIES:  No peripheral edema.   SKIN:  Clear with no obvious rashes or skin changes. No nail dyscrasia. NEURO:  somnolent     LAB RESULTS:  CMP     Component Value  Date/Time   NA 148 (H) 07/25/2017 1204   NA 150 (H) 07/25/2017 1012   K 2.8 (L) 07/25/2017 1204   K 2.9 (LL) 07/25/2017 1012   CL 109 07/25/2017 1204   CO2 15 (L) 07/25/2017 1204   CO2  15 (L) 07/25/2017 1012   GLUCOSE 113 (H) 07/25/2017 1204   GLUCOSE 112 07/25/2017 1012   BUN 17 07/25/2017 1204   BUN 16.3 07/25/2017 1012   CREATININE 1.49 (H) 07/25/2017 1204   CREATININE 1.5 (H) 07/25/2017 1012   CALCIUM 6.6 (L) 07/25/2017 1204   CALCIUM 6.8 (L) 07/25/2017 1012   PROT 7.0 07/25/2017 1204   PROT 6.7 07/25/2017 1012   ALBUMIN 2.9 (L) 07/25/2017 1204   ALBUMIN 2.6 (L) 07/25/2017 1012   AST 43 (H) 07/25/2017 1204   AST 40 (H) 07/25/2017 1012   ALT 21 07/25/2017 1204   ALT 18 07/25/2017 1012   ALKPHOS 137 (H) 07/25/2017 1204   ALKPHOS 146 07/25/2017 1012   BILITOT 1.9 (H) 07/25/2017 1204   BILITOT 0.81 07/25/2017 1012   GFRNONAA 30 (L) 07/25/2017 1204   GFRAA 35 (L) 07/25/2017 1204    No results found for: TOTALPROTELP, ALBUMINELP, A1GS, A2GS, BETS, BETA2SER, GAMS, MSPIKE, SPEI  No results found for: KPAFRELGTCHN, LAMBDASER, KAPLAMBRATIO  Lab Results  Component Value Date   WBC 12.2 (H) 07/25/2017   NEUTROABS 8.8 (H) 07/25/2017   HGB 8.9 (L) 07/25/2017   HCT 26.8 (L) 07/25/2017   MCV 89.9 07/25/2017   PLT 318 07/25/2017      Chemistry      Component Value Date/Time   NA 148 (H) 07/25/2017 1204   NA 150 (H) 07/25/2017 1012   K 2.8 (L) 07/25/2017 1204   K 2.9 (LL) 07/25/2017 1012   CL 109 07/25/2017 1204   CO2 15 (L) 07/25/2017 1204   CO2 15 (L) 07/25/2017 1012   BUN 17 07/25/2017 1204   BUN 16.3 07/25/2017 1012   CREATININE 1.49 (H) 07/25/2017 1204   CREATININE 1.5 (H) 07/25/2017 1012      Component Value Date/Time   CALCIUM 6.6 (L) 07/25/2017 1204   CALCIUM 6.8 (L) 07/25/2017 1012   ALKPHOS 137 (H) 07/25/2017 1204   ALKPHOS 146 07/25/2017 1012   AST 43 (H) 07/25/2017 1204   AST 40 (H) 07/25/2017 1012   ALT 21 07/25/2017 1204   ALT 18 07/25/2017 1012    BILITOT 1.9 (H) 07/25/2017 1204   BILITOT 0.81 07/25/2017 1012       Lab Results  Component Value Date   LABCA2 523.3 (H) 05/23/2017    No components found for: [id number]  No results for input(s): INR in the last 168 hours.  Urinalysis    Component Value Date/Time   COLORURINE YELLOW 05/14/2017 1641   APPEARANCEUR HAZY (A) 05/14/2017 1641   LABSPEC 1.014 05/14/2017 1641   PHURINE 5.0 05/14/2017 1641   GLUCOSEU NEGATIVE 05/14/2017 1641   HGBUR MODERATE (A) 05/14/2017 1641   BILIRUBINUR NEGATIVE 05/14/2017 1641   KETONESUR 5 (A) 05/14/2017 1641   PROTEINUR 30 (A) 05/14/2017 1641   UROBILINOGEN 1 04/16/2012 1205   NITRITE NEGATIVE 05/14/2017 1641   LEUKOCYTESUR SMALL (A) 05/14/2017 1641     STUDIES: No results found.  ELIGIBLE FOR AVAILABLE RESEARCH PROTOCOL:   ASSESSMENT: 83 y.o.  woman status post left breast upper outer quadrant biopsy and left axillary lymph node biopsy 04/08/2017, both positive for invasive ductal carcinoma, grade 2, estrogen and progesterone receptor positive, HER-2 nonamplified, with an MIB-1 of 60%  (a) right breast upper inner quadrant biopsy showed ductal carcinoma in situ, high-grade, estrogen and progesterone receptor positive  (1) anastrozole started 04/23/2017 Fulvestrant started on 04/29/2017  (2) METASTATIC DISEASE: CT chest on 05/06/2017 + metastatic disease in lungs, liver, and left adrenal  glands, bone scan on 05/06/2017/ + bone metastases, CT brain 5/15 demonstrated parietal bone mets, but no disease in the brain   PLAN: Melaina has an alert potassium of 2.9, is becoming increasingly somnolent, and has had intractable N/V for now two weeks without any known etiology (not treatment related) despite taking anti-emetics around the clock.  Due to the inability of our nurses to start an IV, we sent her to the ER for management and likely admission.    Report called to the delightful charge RN at [facility] , Manuela Schwartz.     Taaliyah  And her daughter are in agreement.  We will see her after discharge.    A total of (30) minutes of face-to-face time was spent with this patient with greater than 50% of that time in counseling and care-coordination.   Scot Dock, NP   07/25/2017 1:53 PM Medical Oncology and Hematology [facility] [address] [place], [place] [address] Tel. [phone]    Fax. [phone]

## 2017-07-25 NOTE — H&P (Signed)
History and Physical    NAJIA HURLBUTT JHE:[id number] DOB: Oct 21, 1925 DOA: 07/25/2017  PCP: Benito Mccreedy, MD  Patient coming from: home  I have personally briefly reviewed patient's old medical records in [place]  Chief Complaint: Profound weakness secondary to vomiting and inability to eat or drink over the past 2 weeks.   HPI: RIKA DAUGHDRILL is a 83 y.o. female with medical history significant for invasive ductalbreastcancer on 03/2017, dysrhythmia, HTN, and GERD;who presents with generalized weakness over the last 2 weeks. Patient states that she has been unwell since this past April and was recently admitted for a similar problem. Patient states that she has had progressively decreasing appetite and increasing lethargy however over the past 2 weeks she has vomited up all food or drink that she has attempted. She has gotten progressively weaker over this time.  Patient notes that she vomits within 15 minutes of eating. This is sometimes associated with nausea but sometimes it does not. She has self treated with Compazine without any good effect. She has also tried Phenergan which is also been ineffective. She has no abdominal pain. She denies any other symptoms, denies fevers, chills, cough, shortness of breath, DOE different from baseline, dysuria or chest pain.  ED Course:  Workup in the ED revealed hypernatremia with a sodium of 150, hypokalemia at 2.8 and hypomagnesemia at 1.5. She was also noted to be in mild renal failure with creatinine of 1.5 up from a baseline of 1.0. Her calcium was markedly low at 6.8 although her albumin was 2.6 which corrects with calcium of 8.0  Review of Systems: As per HPI otherwise 10 point review of systems negative.   Past Medical History:  Diagnosis Date  . Abdominal pain   . Abnormal heart rhythm   . Arthritis   . Breast cancer ([place])    BILATERAL  . Dysrhythmia   . Esophageal ulcer   . Flatulence, eructation, and gas  pain   . GERD (gastroesophageal reflux disease)    with esophageal ulcer in past  . Hypertension    LOV WITH EKG DR [facility] RECEIVED 12/02/11 1530 and placed on chart  . Iron deficiency anemia   . Myocardial infarction [facility])    silent MI 1961/ states anterior wall  . Nausea   . Sinus problem   . Vomiting   . Wears glasses     Past Surgical History:  Procedure Laterality Date  . ABDOMINAL HYSTERECTOMY    . BOWEL RESECTION  03/24/2012   Procedure: SMALL BOWEL RESECTION;  Surgeon: Pedro Earls, MD;  Location: [facility] ORS;  Service: General;  Laterality: N/A;  . BREAST EXCISIONAL BIOPSY Left   . BREAST EXCISIONAL BIOPSY Right   . CHOLECYSTECTOMY    . COLON SURGERY  12/06/11   lap sigmoid colectomy   . COLONOSCOPY    . FOOT SURGERY       reports that she has been smoking Cigarettes.  She has a 25.00 pack-year smoking history. She has never used smokeless tobacco. She reports that she does not drink alcohol or use drugs.  No Known Allergies  Family History  Problem Relation Age of Onset  . Cancer Mother        breast cancer  . Breast cancer Mother 75  . Heart disease Father        heart attack     Family history is noncontributory but is notable for a mother with breast cancer.  Prior to Admission medications  Medication Sig Start Date End Date Taking? Authorizing Provider  anastrozole (ARIMIDEX) 1 MG tablet Take 1 tablet (1 mg total) by mouth daily. 07/22/17  Yes Magrinat, Virgie Dad, MD  atorvastatin (LIPITOR) 20 MG tablet Take 20 mg by mouth daily. 07/14/17  Yes [provider]  ondansetron (ZOFRAN) 8 MG tablet Take 1 tablet (8 mg total) by mouth every 8 (eight) hours as needed for nausea or vomiting. 04/24/17  Yes Hayden Pedro, PA-C  oxyCODONE (OXY IR/ROXICODONE) 5 MG immediate release tablet Take 1 tablet (5 mg total) by mouth every 6 (six) hours as needed for severe pain. 07/22/17  Yes Nicholas Lose, MD  polyethylene glycol (MIRALAX / GLYCOLAX) packet Take  17 g by mouth every other day.   Yes [provider]  prochlorperazine (COMPAZINE) 10 MG tablet Take 1 tablet (10 mg total) by mouth every 6 (six) hours as needed for nausea or vomiting. 07/22/17  Yes Nicholas Lose, MD  rosuvastatin (CRESTOR) 10 MG tablet Take 10 mg by mouth daily.   Yes [provider]  VALSARTAN PO Take by mouth.   Yes [provider]  vitamin B-12 (CYANOCOBALAMIN) 1000 MCG tablet Take 1,000 mcg by mouth daily.   Yes [provider]  Vitamin D, Cholecalciferol, 1000 units CAPS Take 1 capsule by mouth daily.   Yes [provider]  amoxicillin-clavulanate (AUGMENTIN) 500-125 MG tablet Take 1 tablet (500 mg total) by mouth 2 (two) times daily. Patient not taking: Reported on 07/25/2017 05/23/17   Caren Griffins, MD  feeding supplement, ENSURE ENLIVE, (ENSURE ENLIVE) LIQD Take 237 mLs by mouth 3 (three) times daily between meals. Patient not taking: Reported on 07/25/2017 05/23/17   Caren Griffins, MD  fluconazole (DIFLUCAN) 100 MG tablet Take 1 tablet (100 mg total) by mouth daily. Patient not taking: Reported on 07/25/2017 05/23/17   Caren Griffins, MD  hydrALAZINE (APRESOLINE) 10 MG tablet Take 1 tablet (10 mg total) by mouth 4 (four) times daily. Patient not taking: Reported on 07/25/2017 05/23/17 05/23/18  Caren Griffins, MD  potassium chloride 20 MEQ/15ML (10%) SOLN TAKE 15 ML BY MOUTH DAILY Patient not taking: Reported on 07/25/2017 07/23/17   Nicholas Lose, MD  prochlorperazine (COMPAZINE) 10 MG tablet TAKE 1 TABLET(10 MG) BY MOUTH EVERY 6 HOURS AS NEEDED FOR NAUSEA OR VOMITING Patient not taking: Reported on 07/25/2017 07/23/17   Nicholas Lose, MD    Physical Exam: Vitals:   07/25/17 1530 07/25/17 1600 07/25/17 1630 07/25/17 1655  BP: (!) 139/55 (!) 130/45 (!) 132/41   Pulse:    79  Resp: 18 17 15 15   Temp:      TempSrc:      SpO2:    96%    Constitutional: NAD, calm, comfortable Vitals:   07/25/17 1530 07/25/17 1600 07/25/17 1630  07/25/17 1655  BP: (!) 139/55 (!) 130/45 (!) 132/41   Pulse:    79  Resp: 18 17 15 15   Temp:      TempSrc:      SpO2:    96%   Eyes: PERRL, lids and conjunctivae normal ENMT: Mucous membranes are Dry.Neck: normal, supple, no masses, no thyromegaly Respiratory: clear to auscultation bilaterally, no wheezing, no crackles. Poor respiratory effort. No accessory muscle use.  Cardiovascular: Regular rate and rhythm, no murmurs / rubs / gallops. No extremity edema. 2+ pedal pulses. No carotid bruits.  Abdomen: Positive bowel sounds normal tone and pitch. She has mild tenderness to deep palpation in her epigastric  and right upper quadrant area. Liver is enlarged. Musculoskeletal: no clubbing / cyanosis. No joint deformity upper and lower extremities. Good ROM, no contractures. Normal muscle tone.  Skin: no rashes, lesions, ulcers. Poor skin turgor Psychiatric: Lethargic but coherent and cooperative.  Labs on Admission: I have personally reviewed following labs and imaging studies  CBC:  Recent Labs Lab 07/22/17 1333 07/25/17 1012 07/25/17 1204  WBC 11.1* 9.6 12.2*  NEUTROABS 8.0* 7.0* 8.8*  HGB 9.8* 8.6* 8.9*  HCT 30.6* 27.0* 26.8*  MCV 92.9 90.6 89.9  PLT 319 303 825   Basic Metabolic Panel:  Recent Labs Lab 07/22/17 1333 07/25/17 1012 07/25/17 1204  NA 144 150* 148*  K 3.0* 2.9* 2.8*  CL  --   --  109  CO2 14* 15* 15*  GLUCOSE 73 112 113*  BUN 9.7 16.3 17  CREATININE 1.1 1.5* 1.49*  CALCIUM 6.6* 6.8* 6.6*  MG  --   --  1.5*   GFR: CrCl cannot be calculated (Unknown ideal weight.). Liver Function Tests:  Recent Labs Lab 07/22/17 1333 07/25/17 1012 07/25/17 1204  AST 47* 40* 43*  ALT 20 18 21   ALKPHOS 168* 146 137*  BILITOT 0.88 0.81 1.9*  PROT 6.9 6.7 7.0  ALBUMIN 2.7* 2.6* 2.9*    Recent Labs Lab 07/25/17 1204  LIPASE 16   No results for input(s): AMMONIA in the last 168 hours. Coagulation Profile: No results for input(s): INR, PROTIME in the last  168 hours. Cardiac Enzymes: No results for input(s): CKTOTAL, CKMB, CKMBINDEX, TROPONINI in the last 168 hours. BNP (last 3 results) No results for input(s): PROBNP in the last 8760 hours. HbA1C: No results for input(s): HGBA1C in the last 72 hours. CBG: No results for input(s): GLUCAP in the last 168 hours. Lipid Profile: No results for input(s): CHOL, HDL, LDLCALC, TRIG, CHOLHDL, LDLDIRECT in the last 72 hours. Thyroid Function Tests: No results for input(s): TSH, T4TOTAL, FREET4, T3FREE, THYROIDAB in the last 72 hours. Anemia Panel: No results for input(s): VITAMINB12, FOLATE, FERRITIN, TIBC, IRON, RETICCTPCT in the last 72 hours. Urine analysis:    Component Value Date/Time   COLORURINE YELLOW 05/14/2017 1641   APPEARANCEUR HAZY (A) 05/14/2017 1641   LABSPEC 1.014 05/14/2017 1641   PHURINE 5.0 05/14/2017 1641   GLUCOSEU NEGATIVE 05/14/2017 1641   HGBUR MODERATE (A) 05/14/2017 1641   BILIRUBINUR NEGATIVE 05/14/2017 1641   KETONESUR 5 (A) 05/14/2017 1641   PROTEINUR 30 (A) 05/14/2017 1641   UROBILINOGEN 1 04/16/2012 1205   NITRITE NEGATIVE 05/14/2017 1641   LEUKOCYTESUR SMALL (A) 05/14/2017 1641    Radiological Exams on Admission: Abdominal Series ordered and pending  Assessment/Plan Active Problems:  Hypokalemia We are repleting aggressively with potassium in IV fluids as well as potassium riders and will follow closely  Hypomagnesemia We'll provide 2 g IV 1 now to repeat in the morning and replete as needed.  Dehydration and hypernatremia Treat with normal saline with 40 of K and repeat labs in the morning. Avoid overcorrection of sodium.  UTI Treat with ceftriaxone given positive UA. This could potentially be a cause of her vomiting.  Intractable vomiting Check small bowel series to rule out small bowel obstruction. Will follow response to hydration and when necessary Zofran. We'll place on clear liquid diet only. If conservative measures not helpful would  consider consultation with GI if patient is willing.  Breast cancer Patient is under the care of oncology here. I am holding her Arimidex for now. She is DO  NOT RESUSCITATE.    DVT prophylaxis: Lovenox Code Status: DO NOT RESUSCITATE Family Communication:  Disposition Plan: Patient may be discharged in the next 2-3 days. However she will need social work consultation regarding best disposition for her given her debility. Consults called: No consults called as yet but she may well need a GI consult depending upon abdominal series results and response to hydration. Admission status: Inpatient   Vashti Hey MD [facility] Pager [phone]  If 7PM-7AM, please contact night-coverage [email or web address] Password [email or web address]  07/25/2017, 6:07 PM

## 2017-07-25 NOTE — ED Provider Notes (Signed)
[facility] DEPT Provider Note   CSN: [id number] Arrival date & time: 07/25/17  1124     History   Chief Complaint Chief Complaint  Patient presents with  . Diarrhea  . Emesis    HPI Samantha Abbott is a 83 y.o. female history of hypertension, MI, known breast cancer with weekly injection here presenting with vomiting, diarrhea. She states that she has been vomiting for the last 10 days. She states that she was unable to keep anything down. He is been trying Zofran at home with no relief and the doctor called in Compazine with no relief either. Today she had an episode of diarrhea but denies any body aches or history C. difficile. Denies any fevers or chills or abdominal pain. Patient went to oncology center and had labs showed K 2.8 and was sent for dehydration.    The history is provided by the patient and a relative.    Past Medical History:  Diagnosis Date  . Abdominal pain   . Abnormal heart rhythm   . Arthritis   . Breast cancer ([place])    BILATERAL  . Dysrhythmia   . Esophageal ulcer   . Flatulence, eructation, and gas pain   . GERD (gastroesophageal reflux disease)    with esophageal ulcer in past  . Hypertension    LOV WITH EKG DR [facility] RECEIVED 12/02/11 1530 and placed on chart  . Iron deficiency anemia   . Myocardial infarction [facility])    silent MI 1961/ states anterior wall  . Nausea   . Sinus problem   . Vomiting   . Wears glasses     Patient Active Problem List   Diagnosis Date Noted  . Elevated troponin 05/15/2017  . Hypercalcemia 05/15/2017  . Essential hypertension 05/15/2017  . Acute kidney injury superimposed on chronic kidney disease ([place]) 05/15/2017  . Sepsis secondary to UTI ([place]) 05/14/2017  . Carcinoma of upper-outer quadrant of left breast in female, estrogen receptor positive ([place]) 04/21/2017  . H/O colostomy-Closed 2013 06/04/2012  . 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  GERD (gastroesophageal reflux disease) 10/31/2011  . Irregular heartbeat 10/31/2011    Past Surgical History:  Procedure Laterality Date  . ABDOMINAL HYSTERECTOMY    . BOWEL RESECTION  03/24/2012   Procedure: SMALL BOWEL RESECTION;  Surgeon: Pedro Earls, MD;  Location: [facility] ORS;  Service: General;  Laterality: N/A;  . BREAST EXCISIONAL BIOPSY Left   . BREAST EXCISIONAL BIOPSY Right   . CHOLECYSTECTOMY    . COLON SURGERY  12/06/11   lap sigmoid colectomy   . COLONOSCOPY    . FOOT SURGERY      OB History    No data available       Home Medications    Prior to Admission medications   Medication Sig Start Date End Date Taking? Authorizing Provider  anastrozole (ARIMIDEX) 1 MG tablet Take 1 tablet (1 mg total) by mouth daily. 07/22/17  Yes Magrinat, Virgie Dad, MD  atorvastatin (LIPITOR) 20 MG tablet Take 20 mg by mouth daily. 07/14/17  Yes [provider]  ondansetron (ZOFRAN) 8 MG tablet Take 1 tablet (8 mg total) by mouth every 8 (eight) hours as needed for nausea or vomiting. 04/24/17  Yes Hayden Pedro, PA-C  oxyCODONE (OXY IR/ROXICODONE) 5 MG immediate release tablet Take 1 tablet (5 mg total) by mouth every 6 (six) hours as needed for severe pain. 07/22/17  Yes Nicholas Lose, MD  polyethylene glycol (MIRALAX /  GLYCOLAX) packet Take 17 g by mouth every other day.   Yes [provider]  prochlorperazine (COMPAZINE) 10 MG tablet Take 1 tablet (10 mg total) by mouth every 6 (six) hours as needed for nausea or vomiting. 07/22/17  Yes Nicholas Lose, MD  rosuvastatin (CRESTOR) 10 MG tablet Take 10 mg by mouth daily.   Yes [provider]  VALSARTAN PO Take by mouth.   Yes [provider]  vitamin B-12 (CYANOCOBALAMIN) 1000 MCG tablet Take 1,000 mcg by mouth daily.   Yes [provider]  Vitamin D, Cholecalciferol, 1000 units CAPS Take 1 capsule by mouth daily.   Yes [provider]  amoxicillin-clavulanate (AUGMENTIN) 500-125 MG  tablet Take 1 tablet (500 mg total) by mouth 2 (two) times daily. Patient not taking: Reported on 07/25/2017 05/23/17   Caren Griffins, MD  feeding supplement, ENSURE ENLIVE, (ENSURE ENLIVE) LIQD Take 237 mLs by mouth 3 (three) times daily between meals. Patient not taking: Reported on 07/25/2017 05/23/17   Caren Griffins, MD  fluconazole (DIFLUCAN) 100 MG tablet Take 1 tablet (100 mg total) by mouth daily. Patient not taking: Reported on 07/25/2017 05/23/17   Caren Griffins, MD  hydrALAZINE (APRESOLINE) 10 MG tablet Take 1 tablet (10 mg total) by mouth 4 (four) times daily. Patient not taking: Reported on 07/25/2017 05/23/17 05/23/18  Caren Griffins, MD  potassium chloride 20 MEQ/15ML (10%) SOLN TAKE 15 ML BY MOUTH DAILY Patient not taking: Reported on 07/25/2017 07/23/17   Nicholas Lose, MD  prochlorperazine (COMPAZINE) 10 MG tablet TAKE 1 TABLET(10 MG) BY MOUTH EVERY 6 HOURS AS NEEDED FOR NAUSEA OR VOMITING Patient not taking: Reported on 07/25/2017 07/23/17   Nicholas Lose, MD    Family History Family History  Problem Relation Age of Onset  . Cancer Mother        breast cancer  . Breast cancer Mother 36  . Heart disease Father        heart attack     Social History Social History  Substance Use Topics  . Smoking status: Current Some Day Smoker    Packs/day: 0.50    Years: 50.00    Types: Cigarettes  . Smokeless tobacco: Never Used  . Alcohol use No     Allergies   Patient has no known allergies.   Review of Systems Review of Systems  Gastrointestinal: Positive for diarrhea and vomiting.  All other systems reviewed and are negative.    Physical Exam Updated Vital Signs BP (!) 129/55   Pulse 81   Temp (!) 96.7 F (35.9 C) (Oral) Comment: Pt had some difficulty holdling thermometer under tongue  Resp 17   SpO2 100%   Physical Exam  Constitutional: She is oriented to person, place, and time.  Chronically ill, dehydrated   HENT:  Head: Normocephalic.  MM dry   Eyes:  Pupils are equal, round, and reactive to light. Conjunctivae and EOM are normal.  Neck: Normal range of motion. Neck supple.  Cardiovascular: Normal rate, regular rhythm and normal heart sounds.   Pulmonary/Chest: Effort normal and breath sounds normal. No respiratory distress. She has no wheezes.  Abdominal: Soft. Bowel sounds are normal. She exhibits no distension. There is no tenderness.  Musculoskeletal: Normal range of motion.  Neurological: She is alert and oriented to person, place, and time. No cranial nerve deficit.  Skin: Skin is warm.  Psychiatric: She has a normal mood and affect.  Nursing note and vitals reviewed.  ED Treatments / Results  Labs (all labs ordered are listed, but only abnormal results are displayed) Labs Reviewed  CBC WITH DIFFERENTIAL/PLATELET - Abnormal; Notable for the following:       Result Value   WBC 12.2 (*)    RBC 2.98 (*)    Hemoglobin 8.9 (*)    HCT 26.8 (*)    RDW 20.2 (*)    Neutro Abs 8.8 (*)    All other components within normal limits  COMPREHENSIVE METABOLIC PANEL - Abnormal; Notable for the following:    Sodium 148 (*)    Potassium 2.8 (*)    CO2 15 (*)    Glucose, Bld 113 (*)    Creatinine, Ser 1.49 (*)    Calcium 6.6 (*)    Albumin 2.9 (*)    AST 43 (*)    Alkaline Phosphatase 137 (*)    Total Bilirubin 1.9 (*)    GFR calc non Af Amer 30 (*)    GFR calc Af Amer 35 (*)    Anion gap 24 (*)    All other components within normal limits  LIPASE, BLOOD  URINALYSIS, ROUTINE W REFLEX MICROSCOPIC    EKG  EKG Interpretation None       Radiology No results found.  Procedures Procedures (including critical care time)  Angiocath insertion Performed by: Wandra Arthurs  Consent: Verbal consent obtained. Risks and benefits: risks, benefits and alternatives were discussed Time out: Immediately prior to procedure a "time out" was called to verify the correct patient, procedure, equipment, support staff and site/side marked as  required.  Preparation: Patient was prepped and draped in the usual sterile fashion.  Vein Location: R antecube  Ultrasound Guided  Gauge: 20 long   Normal blood return and flush without difficulty Patient tolerance: Patient tolerated the procedure well with no immediate complications.     Medications Ordered in ED Medications  potassium chloride 10 mEq in 100 mL IVPB (10 mEq Intravenous New Bag/Given 07/25/17 1223)  sodium chloride 0.9 % bolus 1,000 mL (not administered)  sodium chloride 0.9 % bolus 1,000 mL (1,000 mLs Intravenous New Bag/Given 07/25/17 1217)  promethazine (PHENERGAN) injection 12.5 mg (12.5 mg Intravenous Given 07/25/17 1219)  ondansetron (ZOFRAN) injection 4 mg (4 mg Intravenous Given 07/25/17 1226)  morphine 4 MG/ML injection 4 mg (4 mg Intravenous Given 07/25/17 1250)     Initial Impression / Assessment and Plan / ED Course  I have reviewed the triage vital signs and the nursing notes.  Pertinent labs & imaging results that were available during my care of the patient were reviewed by me and considered in my medical decision making (see chart for details).     Samantha Abbott is a 83 y.o. female here with vomiting, diarrhea. Appears dehydrated. Consider viral gastro vs side effect of chemo. Afebrile, abdomen nontender. She is a difficult IV stick and I placed R antecube IV. Will give IVF, hydrate and reassess.   1:52 PM Still nauseated after zofran, compazine. Labs showed Na 148, bicarb 15, AG 24, Cr 1.5 (baseline 1) likely from dehydration. Given 2 L NS bolus as well. Will admit for dehydration, acute renal failure, hypernatremia.    Final Clinical Impressions(s) / ED Diagnoses   Final diagnoses:  None    New Prescriptions New Prescriptions   No medications on file     Drenda Freeze, MD 07/25/17 1353

## 2017-07-25 NOTE — ED Notes (Signed)
Kandyce Rud, daughter, can be reached at [phone] if need anything while goen to get patient's overnight bag.

## 2017-07-25 NOTE — ED Notes (Signed)
Bed: WA06 Expected date:  Expected time:  Means of arrival:  Comments: New pt

## 2017-07-25 NOTE — Progress Notes (Signed)
Patient's daughter Cathi Roan) called and left voicemail regarding grant.  Returned call back and she advised that they will need help with burial insurance/expenses. Advised daughter our in-house grant helps with personal expenses while going through treatment. Asked if she knew when the next office visit is. She advised not sure as of yet. Asked her to call me when next visit has been scheduled so that we can schedule a time to apply for the [employer] grant at that next visit. Advised her what was needed to apply. She verbalized understanding and has my name and number for any additional financial questions or concerns.

## 2017-07-25 NOTE — ED Notes (Signed)
RN starting line and collecting blood work 

## 2017-07-26 DIAGNOSIS — Z170 Estrogen receptor positive status [ER+]: Secondary | ICD-10-CM | POA: Diagnosis not present

## 2017-07-26 DIAGNOSIS — N184 Chronic kidney disease, stage 4 (severe): Secondary | ICD-10-CM | POA: Diagnosis present

## 2017-07-26 DIAGNOSIS — Z9049 Acquired absence of other specified parts of digestive tract: Secondary | ICD-10-CM | POA: Diagnosis not present

## 2017-07-26 DIAGNOSIS — E860 Dehydration: Secondary | ICD-10-CM | POA: Diagnosis present

## 2017-07-26 DIAGNOSIS — E870 Hyperosmolality and hypernatremia: Secondary | ICD-10-CM | POA: Diagnosis present

## 2017-07-26 DIAGNOSIS — Z8249 Family history of ischemic heart disease and other diseases of the circulatory system: Secondary | ICD-10-CM | POA: Diagnosis not present

## 2017-07-26 DIAGNOSIS — I252 Old myocardial infarction: Secondary | ICD-10-CM | POA: Diagnosis not present

## 2017-07-26 DIAGNOSIS — D638 Anemia in other chronic diseases classified elsewhere: Secondary | ICD-10-CM | POA: Diagnosis present

## 2017-07-26 DIAGNOSIS — Z803 Family history of malignant neoplasm of breast: Secondary | ICD-10-CM | POA: Diagnosis not present

## 2017-07-26 DIAGNOSIS — R112 Nausea with vomiting, unspecified: Secondary | ICD-10-CM | POA: Diagnosis not present

## 2017-07-26 DIAGNOSIS — F17210 Nicotine dependence, cigarettes, uncomplicated: Secondary | ICD-10-CM | POA: Diagnosis present

## 2017-07-26 DIAGNOSIS — E872 Acidosis: Secondary | ICD-10-CM | POA: Diagnosis present

## 2017-07-26 DIAGNOSIS — Z90710 Acquired absence of both cervix and uterus: Secondary | ICD-10-CM | POA: Diagnosis not present

## 2017-07-26 DIAGNOSIS — E876 Hypokalemia: Secondary | ICD-10-CM | POA: Diagnosis present

## 2017-07-26 DIAGNOSIS — M1990 Unspecified osteoarthritis, unspecified site: Secondary | ICD-10-CM | POA: Diagnosis present

## 2017-07-26 DIAGNOSIS — C50919 Malignant neoplasm of unspecified site of unspecified female breast: Secondary | ICD-10-CM | POA: Diagnosis present

## 2017-07-26 DIAGNOSIS — N390 Urinary tract infection, site not specified: Secondary | ICD-10-CM | POA: Diagnosis present

## 2017-07-26 DIAGNOSIS — Z515 Encounter for palliative care: Secondary | ICD-10-CM | POA: Diagnosis not present

## 2017-07-26 DIAGNOSIS — I129 Hypertensive chronic kidney disease with stage 1 through stage 4 chronic kidney disease, or unspecified chronic kidney disease: Secondary | ICD-10-CM | POA: Diagnosis present

## 2017-07-26 DIAGNOSIS — D509 Iron deficiency anemia, unspecified: Secondary | ICD-10-CM | POA: Diagnosis present

## 2017-07-26 DIAGNOSIS — Z66 Do not resuscitate: Secondary | ICD-10-CM | POA: Diagnosis present

## 2017-07-26 DIAGNOSIS — K219 Gastro-esophageal reflux disease without esophagitis: Secondary | ICD-10-CM | POA: Diagnosis present

## 2017-07-26 LAB — CBC
HCT: 22.2 % — ABNORMAL LOW (ref 36.0–46.0)
HEMOGLOBIN: 7.5 g/dL — AB (ref 12.0–15.0)
MCH: 30.7 pg (ref 26.0–34.0)
MCHC: 33.8 g/dL (ref 30.0–36.0)
MCV: 91 fL (ref 78.0–100.0)
PLATELETS: 278 10*3/uL (ref 150–400)
RBC: 2.44 MIL/uL — ABNORMAL LOW (ref 3.87–5.11)
RDW: 20.5 % — AB (ref 11.5–15.5)
WBC: 13.6 10*3/uL — ABNORMAL HIGH (ref 4.0–10.5)

## 2017-07-26 LAB — COMPREHENSIVE METABOLIC PANEL
ALBUMIN: 2.3 g/dL — AB (ref 3.5–5.0)
ALK PHOS: 109 U/L (ref 38–126)
ALT: 17 U/L (ref 14–54)
AST: 38 U/L (ref 15–41)
Anion gap: 18 — ABNORMAL HIGH (ref 5–15)
BUN: 14 mg/dL (ref 6–20)
CALCIUM: 5.8 mg/dL — AB (ref 8.9–10.3)
CO2: 13 mmol/L — AB (ref 22–32)
CREATININE: 1.25 mg/dL — AB (ref 0.44–1.00)
Chloride: 117 mmol/L — ABNORMAL HIGH (ref 101–111)
GFR calc Af Amer: 43 mL/min — ABNORMAL LOW (ref >60)
GFR calc non Af Amer: 37 mL/min — ABNORMAL LOW (ref >60)
Glucose, Bld: 72 mg/dL (ref 65–99)
Potassium: 4.3 mmol/L (ref 3.5–5.1)
SODIUM: 148 mmol/L — AB (ref 135–145)
Total Bilirubin: 1.1 mg/dL (ref 0.3–1.2)
Total Protein: 5.9 g/dL — ABNORMAL LOW (ref 6.5–8.1)

## 2017-07-26 LAB — MAGNESIUM: MAGNESIUM: 2.2 mg/dL (ref 1.7–2.4)

## 2017-07-26 LAB — PHOSPHORUS: PHOSPHORUS: 1.5 mg/dL — AB (ref 2.5–4.6)

## 2017-07-26 LAB — LACTIC ACID, PLASMA: LACTIC ACID, VENOUS: 1 mmol/L (ref 0.5–1.9)

## 2017-07-26 MED ORDER — POTASSIUM PHOSPHATES 15 MMOLE/5ML IV SOLN
10.0000 meq | Freq: Once | INTRAVENOUS | Status: AC
  Administered 2017-07-26: 10 meq via INTRAVENOUS
  Filled 2017-07-26: qty 2.27

## 2017-07-26 MED ORDER — DEXTROSE-NACL 5-0.45 % IV SOLN
INTRAVENOUS | Status: DC
  Administered 2017-07-26: 16:00:00 via INTRAVENOUS

## 2017-07-26 MED ORDER — PROMETHAZINE HCL 25 MG/ML IJ SOLN
6.2500 mg | Freq: Four times a day (QID) | INTRAMUSCULAR | Status: DC | PRN
Start: 2017-07-26 — End: 2017-07-28
  Administered 2017-07-26 – 2017-07-28 (×4): 6.25 mg via INTRAVENOUS
  Filled 2017-07-26 (×5): qty 1

## 2017-07-26 MED ORDER — ONDANSETRON HCL 4 MG/2ML IJ SOLN
4.0000 mg | Freq: Four times a day (QID) | INTRAMUSCULAR | Status: DC
  Administered 2017-07-27 – 2017-07-28 (×4): 4 mg via INTRAVENOUS
  Filled 2017-07-26 (×4): qty 2

## 2017-07-26 MED ORDER — SODIUM CHLORIDE 0.9 % IV SOLN
1.0000 g | Freq: Once | INTRAVENOUS | Status: AC
  Administered 2017-07-26: 1 g via INTRAVENOUS
  Filled 2017-07-26: qty 10

## 2017-07-26 NOTE — Progress Notes (Signed)
Pt had minimal urine output during shift. Bladder scan revealed 274 ml. Fluids encouraged. Will continue to monitor closely.

## 2017-07-26 NOTE — Progress Notes (Signed)
PROGRESS NOTE    Samantha Abbott  WIO:[id number] DOB: August 08, 1925 DOA: 07/25/2017 PCP: Benito Mccreedy, MD     Brief Narrative:  Samantha Abbott is a 83 y.o. female with medical history significant for invasive ductal breastcancer on 03/2017, dysrhythmia, HTN, and GERD;who presents with generalized weakness over the last 2 weeks as well as intractable nausea, vomiting, decreased appetite with decreased oral intake, increasing lethargy. Workup in the ED revealed hypernatremia with a sodium of 150, hypokalemia at 2.8 and hypomagnesemia at 1.5. She was also noted to be in mild renal failure with creatinine of 1.5.   Assessment & Plan:   Active Problems:   GERD (gastroesophageal reflux disease)   Essential hypertension   Hypokalemia  Intractable nausea, vomiting -Abdominal x-ray without obstruction -Improved today, supportive care and antiemetics -Advance diet to full liquids   AKI on CKD stage 4 -Due to poor oral intake -Baseline Cr has been 1.8 in May, most recently 1.1 in July  -Improving with IVF   Hypokalemia -IV replacement, trend  Hypomagnesemia -IV replacement, trend  Hypophosphatemia -IV replacement, trend  Hypocalcemia -IV replacement, trend  Metabolic acidosis -With elevated anion gap, lactic acidosis  Hypernatremia -Due to poor oral intake  -Improving with IV fluids  Anemia of chronic disease -Baseline Hgb 9 -Trend   Stage IV breast cancer, estrogen receptor positive  -Follows with Dr. Jana Hakim -Anastrozole and fulvestrant   DVT prophylaxis: lovenox Code Status: DNR Family Communication: daughter at bedside Disposition Plan: pending improvement   Consultants:   None  Procedures:   None   Antimicrobials:  Anti-infectives    Start     Dose/Rate Route Frequency Ordered Stop   07/25/17 2000  cefTRIAXone (ROCEPHIN) 1 g in dextrose 5 % 50 mL IVPB  Status:  Discontinued     1 g 100 mL/hr over 30 Minutes Intravenous Every 24 hours  07/25/17 1823 07/26/17 1434       Subjective: No complaints this morning, states her nausea is better, feeling better overall but still very weak. Has not had an appetite to eat breakfast.   Objective: Vitals:   07/25/17 1809 07/25/17 1810 07/25/17 1830 07/25/17 2117  BP: (!) 182/58 (!) 175/66 (!) 162/60 (!) 159/67  Pulse: 85   65  Resp:    18  Temp: 97.8 F (36.6 C)   98.7 F (37.1 C)  TempSrc: Oral   Oral  SpO2: 97%   97%  Weight:   56.4 kg (124 lb 5.4 oz)   Height:   5\' 1"  (1.549 m)     Intake/Output Summary (Last 24 hours) at 07/26/17 1441 Last data filed at 07/26/17 0600  Gross per 24 hour  Intake          2196.67 ml  Output              300 ml  Net          1896.67 ml   Filed Weights   07/25/17 1830  Weight: 56.4 kg (124 lb 5.4 oz)    Examination:  General exam: Appears calm and comfortable, elderly and thin  Respiratory system: Clear to auscultation. Respiratory effort normal. Cardiovascular system: S1 & S2 heard, RRR. No JVD, murmurs, rubs, gallops or clicks. No pedal edema. Gastrointestinal system: Abdomen is nondistended, soft and nontender. No organomegaly or masses felt. Normal bowel sounds heard. Central nervous system: Alert and oriented. Nonfocal exam  Extremities: Symmetric 5 x 5 power. Skin: No rashes, lesions or ulcers Psychiatry: Judgement and insight appear  normal. Mood & affect flat.   Data Reviewed: I have personally reviewed following labs and imaging studies  CBC:  Recent Labs Lab 07/22/17 1333 07/25/17 1012 07/25/17 1204 07/26/17 0117  WBC 11.1* 9.6 12.2* 13.6*  NEUTROABS 8.0* 7.0* 8.8*  --   HGB 9.8* 8.6* 8.9* 7.5*  HCT 30.6* 27.0* 26.8* 22.2*  MCV 92.9 90.6 89.9 91.0  PLT 319 303 318 706   Basic Metabolic Panel:  Recent Labs Lab 07/22/17 1333 07/25/17 1012 07/25/17 1204 07/26/17 0117  NA 144 150* 148* 148*  K 3.0* 2.9* 2.8* 4.3  CL  --   --  109 117*  CO2 14* 15* 15* 13*  GLUCOSE 73 112 113* 72  BUN 9.7 16.3 17 14     CREATININE 1.1 1.5* 1.49* 1.25*  CALCIUM 6.6* 6.8* 6.6* 5.8*  MG  --   --  1.5* 2.2  PHOS  --   --   --  1.5*   GFR: Estimated Creatinine Clearance: 23 mL/min (A) (by C-G formula based on SCr of 1.25 mg/dL (H)). Liver Function Tests:  Recent Labs Lab 07/22/17 1333 07/25/17 1012 07/25/17 1204 07/26/17 0117  AST 47* 40* 43* 38  ALT 20 18 21 17   ALKPHOS 168* 146 137* 109  BILITOT 0.88 0.81 1.9* 1.1  PROT 6.9 6.7 7.0 5.9*  ALBUMIN 2.7* 2.6* 2.9* 2.3*    Recent Labs Lab 07/25/17 1204  LIPASE 16   No results for input(s): AMMONIA in the last 168 hours. Coagulation Profile: No results for input(s): INR, PROTIME in the last 168 hours. Cardiac Enzymes: No results for input(s): CKTOTAL, CKMB, CKMBINDEX, TROPONINI in the last 168 hours. BNP (last 3 results) No results for input(s): PROBNP in the last 8760 hours. HbA1C: No results for input(s): HGBA1C in the last 72 hours. CBG: No results for input(s): GLUCAP in the last 168 hours. Lipid Profile: No results for input(s): CHOL, HDL, LDLCALC, TRIG, CHOLHDL, LDLDIRECT in the last 72 hours. Thyroid Function Tests: No results for input(s): TSH, T4TOTAL, FREET4, T3FREE, THYROIDAB in the last 72 hours. Anemia Panel: No results for input(s): VITAMINB12, FOLATE, FERRITIN, TIBC, IRON, RETICCTPCT in the last 72 hours. Sepsis Labs:  Recent Labs Lab 07/25/17 1910 07/25/17 2143 07/26/17 0117  LATICACIDVEN 2.0* 2.3* 1.0    Recent Results (from the past 240 hour(s))  TECHNOLOGIST REVIEW     Status: None   Collection Time: 07/25/17 10:12 AM  Result Value Ref Range Status   Technologist Review Oc meta and myelocyte  Final       Radiology Studies: Acute Abdominal Series  Result Date: 07/25/2017 CLINICAL DATA:  N/V/D today; hx breast CA, MI esophageal ulcer; HTN; smoker EXAM: DG ABDOMEN ACUTE W/ 1V CHEST COMPARISON:  Chest x-ray dated 05/19/2017. FINDINGS: Single view of the chest: Heart size and mediastinal contours are stable.  Small nodular densities are again seen bilaterally, stable compared to the earlier chest x-ray, corresponding to the multiple pulmonary nodules better demonstrated on chest CT of 05/06/2017. No new lung findings. No pleural effusion or pneumothorax seen. Osseous structures about the chest are unremarkable. Supine and decubitus views of the abdomen: No dilated large or small bowel loops. Patulous loop of gas-filled bowel in the right lower quadrant is of uncertain significance. No evidence of free intraperitoneal air. No evidence of soft tissue mass or abnormal fluid collection. No acute or suspicious osseous finding. IMPRESSION: 1. Stable chest x-ray. No evidence of pneumonia or pulmonary edema. Small nodular densities again seen within each lung, stable  compared to the chest x-ray of 05/19/2017, corresponding to multiple pulmonary nodules better demonstrated on chest CT of 05/06/2017, described as metastatic disease on the CT report. 2. Nonobstructive bowel gas pattern. Patulous loop of bowel in the right lower quadrant is of uncertain significance, but would consider follow-up chest x-ray in 12-24 hours to ensure stability or resolution. Electronically Signed   By: Franki Cabot M.D.   On: 07/25/2017 18:56      Scheduled Meds: . chlorhexidine  15 mL Mouth Rinse BID  . enoxaparin (LOVENOX) injection  30 mg Subcutaneous Q24H  . feeding supplement  1 Container Oral TID BM  . mouth rinse  15 mL Mouth Rinse q12n4p   Continuous Infusions: . dextrose 5 % and 0.45% NaCl    . potassium PHOSPHATE IVPB (mEq)       LOS: 0 days    Time spent: 40 minutes   Dessa Phi, DO [facility] [email or web address] Password [email or web address] 07/26/2017, 2:41 PM

## 2017-07-26 NOTE — Progress Notes (Signed)
CRITICAL VALUE ALERT  Critical Value:  Calcium 5.8  Date & Time Notied:  07/26/17  Provider Notified: Hal Hope  Orders Received/Actions taken: Calcium gluconate

## 2017-07-27 ENCOUNTER — Other Ambulatory Visit: Payer: Self-pay | Admitting: Oncology

## 2017-07-27 ENCOUNTER — Other Ambulatory Visit: Payer: Self-pay | Admitting: Hematology and Oncology

## 2017-07-27 LAB — CBC WITH DIFFERENTIAL/PLATELET
BASOS PCT: 1 %
Basophils Absolute: 0.1 10*3/uL (ref 0.0–0.1)
Eosinophils Absolute: 0.4 10*3/uL (ref 0.0–0.7)
Eosinophils Relative: 3 %
HEMATOCRIT: 22.1 % — AB (ref 36.0–46.0)
HEMOGLOBIN: 7.3 g/dL — AB (ref 12.0–15.0)
LYMPHS PCT: 21 %
Lymphs Abs: 2.6 10*3/uL (ref 0.7–4.0)
MCH: 29.6 pg (ref 26.0–34.0)
MCHC: 33 g/dL (ref 30.0–36.0)
MCV: 89.5 fL (ref 78.0–100.0)
MONO ABS: 0.9 10*3/uL (ref 0.1–1.0)
MONOS PCT: 7 %
NEUTROS PCT: 68 %
Neutro Abs: 8.5 10*3/uL — ABNORMAL HIGH (ref 1.7–7.7)
Platelets: 242 10*3/uL (ref 150–400)
RBC: 2.47 MIL/uL — AB (ref 3.87–5.11)
RDW: 20.9 % — AB (ref 11.5–15.5)
WBC: 12.5 10*3/uL — AB (ref 4.0–10.5)

## 2017-07-27 LAB — COMPREHENSIVE METABOLIC PANEL
ALBUMIN: 2.2 g/dL — AB (ref 3.5–5.0)
ALT: 17 U/L (ref 14–54)
AST: 31 U/L (ref 15–41)
Alkaline Phosphatase: 100 U/L (ref 38–126)
Anion gap: 13 (ref 5–15)
BILIRUBIN TOTAL: 1.1 mg/dL (ref 0.3–1.2)
BUN: 11 mg/dL (ref 6–20)
CO2: 14 mmol/L — ABNORMAL LOW (ref 22–32)
CREATININE: 1.12 mg/dL — AB (ref 0.44–1.00)
Calcium: 6.2 mg/dL — CL (ref 8.9–10.3)
Chloride: 123 mmol/L — ABNORMAL HIGH (ref 101–111)
GFR calc Af Amer: 49 mL/min — ABNORMAL LOW (ref >60)
GFR, EST NON AFRICAN AMERICAN: 42 mL/min — AB (ref >60)
GLUCOSE: 107 mg/dL — AB (ref 65–99)
POTASSIUM: 4.9 mmol/L (ref 3.5–5.1)
Sodium: 150 mmol/L — ABNORMAL HIGH (ref 135–145)
TOTAL PROTEIN: 5.5 g/dL — AB (ref 6.5–8.1)

## 2017-07-27 LAB — LACTATE DEHYDROGENASE: LDH: 422 U/L — ABNORMAL HIGH (ref 98–192)

## 2017-07-27 LAB — VITAMIN B12: Vitamin B-12: 1325 pg/mL — ABNORMAL HIGH (ref 180–914)

## 2017-07-27 LAB — IRON AND TIBC
Iron: 57 ug/dL (ref 28–170)
Saturation Ratios: 43 % — ABNORMAL HIGH (ref 10.4–31.8)
TIBC: 133 ug/dL — ABNORMAL LOW (ref 250–450)
UIBC: 76 ug/dL

## 2017-07-27 LAB — MAGNESIUM: MAGNESIUM: 1.8 mg/dL (ref 1.7–2.4)

## 2017-07-27 LAB — PHOSPHORUS: Phosphorus: 1.2 mg/dL — ABNORMAL LOW (ref 2.5–4.6)

## 2017-07-27 LAB — FERRITIN: FERRITIN: 582 ng/mL — AB (ref 11–307)

## 2017-07-27 MED ORDER — SODIUM CHLORIDE 0.9 % IV SOLN
1.0000 g | Freq: Once | INTRAVENOUS | Status: AC
  Administered 2017-07-27: 1 g via INTRAVENOUS
  Filled 2017-07-27: qty 10

## 2017-07-27 MED ORDER — DEXTROSE 5 % IV SOLN
INTRAVENOUS | Status: DC
  Administered 2017-07-27: 08:00:00 via INTRAVENOUS

## 2017-07-27 MED ORDER — K PHOS MONO-SOD PHOS DI & MONO 155-852-130 MG PO TABS
250.0000 mg | ORAL_TABLET | Freq: Two times a day (BID) | ORAL | Status: DC
  Administered 2017-07-27: 250 mg via ORAL
  Filled 2017-07-27: qty 1

## 2017-07-27 MED ORDER — CALCIUM CARBONATE 1250 (500 CA) MG PO TABS
1.0000 | ORAL_TABLET | Freq: Three times a day (TID) | ORAL | Status: DC
  Administered 2017-07-27: 500 mg via ORAL
  Filled 2017-07-27: qty 1

## 2017-07-27 MED ORDER — CALCIUM GLUCONATE 500 MG PO TABS: 1.0000 | ORAL_TABLET | Freq: Three times a day (TID) | ORAL | Status: DC

## 2017-07-27 NOTE — Care Management Note (Signed)
Case Management Note  Patient Details  Name: Samantha Abbott MRN: [id number] Date of Birth: [birth year]  Subjective/Objective:   GERD, HTN, nausea and vomiting, AKI                  Action/Plan: Discharge Planning: NCM spoke to dtr, Kylin Genna # [phone], offered choice/list provided. Dtr is requesting pt dc home with Hospice. She was active with [facility]. Contacted [facility] to make aware. Pt has hospital bed, wheelchair, RW and bedside commode at home. Dtr states she is there with pt to assist with care. Contacted [facility] with new referral.  Dtr requesting [facility] transport. Gold DNR form needed for [facility] transport. Updated attending.   PCP Benito Mccreedy MD   Expected Discharge Date:  07/28/2017              Expected Discharge Plan:  Home w Hospice Care  In-House Referral:  NA  Discharge planning Services  CM Consult  Post Acute Care Choice:  Hospice Choice offered to:  Adult Children  DME Arranged:  N/A DME Agency:  NA  HH Arranged:  RN [place] Agency:  [facility]  Status of Service:  Completed, signed off  If discussed at [place] of Stay Meetings, dates discussed:    Additional Comments:  Erenest Rasher, RN 07/27/2017, 10:51 AM

## 2017-07-27 NOTE — Progress Notes (Signed)
PT Cancellation Note  Patient Details Name: Samantha Abbott MRN: [id number] DOB: 1925/01/31   Cancelled Treatment:    Reason Eval/Treat Not Completed: PT screened, no needs identified, will sign off. PT refuses therapy services. Will sign off at her request.    Weston Anna, MPT Pager: [phone]

## 2017-07-27 NOTE — Progress Notes (Signed)
Nutrition Brief Note  RD consulted for nutritional assessment. Per MD note, pt now pursuing home with hospice. Comfort measures ordered.  Body mass index is 23.49 kg/m. Patient meets criteria for normal based on current BMI.   Current diet order is regular, patient is consuming approximately 0% of meals at this time. Labs and medications reviewed.   No nutrition interventions warranted at this time. If nutrition issues arise, please consult RD.   Clayton Bibles, MS, RD, LDN Pager: [phone] After Hours Pager: [phone]

## 2017-07-27 NOTE — Progress Notes (Signed)
PROGRESS NOTE    Samantha Abbott  YIF:[id number] DOB: [birth year] DOA: 07/25/2017 PCP: Benito Mccreedy, MD     Brief Narrative:  Samantha Abbott is a 83 y.o. female with medical history significant for invasive ductal breastcancer on 03/2017, dysrhythmia, HTN, and GERD;who presents with generalized weakness over the last 2 weeks as well as intractable nausea, vomiting, decreased appetite with decreased oral intake, increasing lethargy. Workup in the ED revealed hypernatremia with a sodium of 150, hypokalemia at 2.8 and hypomagnesemia at 1.5. She was also noted to be in mild renal failure with creatinine of 1.5.   Assessment & Plan:   Principal Problem:   Intractable nausea and vomiting Active Problems:   GERD (gastroesophageal reflux disease)   Essential hypertension   Hypokalemia  Intractable nausea, vomiting -Abdominal x-ray without obstruction -Improved, supportive care and antiemetics -Has had poor oral intake, does not have any motivation for food   AKI on CKD stage 4 -Due to poor oral intake -Baseline Cr has been 1.8 in May, most recently 1.1 in July  -Improving with IVF   Hypokalemia -Improved with replacement   Hypomagnesemia -Improved with replacement   Hypophosphatemia -Replace  Hypocalcemia -Replace  Metabolic acidosis -Improved   Hypernatremia -Due to poor oral intake   Anemia of chronic disease -Baseline Hgb 9. Without evident bleed. Ordered anemia labs.   Stage IV breast cancer, estrogen receptor positive  -Follows with Dr. Jana Hakim  Goals of care -Discussed with daughter and patient this morning. She has metastatic breast cancer, treatment is not curative. We discussed her lack of appetite, lack of motivation to eat or sustain herself. We discussed her will to live, if she wants to continue medical treatment for her metabolic derangements, further work up. We discussed that we would not be pursuing any aggressive measures such "force  feeding" to sustain her. I encouraged daughter to discuss with patient her goals, if she wants to be comfortable at home even if it meant shortening her days alive vs continue medical care in the hospital. They have ultimately decided home with hospice.    DVT prophylaxis: lovenox Code Status: DNR Family Communication: daughter at bedside Disposition Plan: home with hospice    Consultants:   None  Procedures:   None   Antimicrobials:  Anti-infectives    Start     Dose/Rate Route Frequency Ordered Stop   07/25/17 2000  cefTRIAXone (ROCEPHIN) 1 g in dextrose 5 % 50 mL IVPB  Status:  Discontinued     1 g 100 mL/hr over 30 Minutes Intravenous Every 24 hours 07/25/17 1823 07/26/17 1434       Subjective: No complaints this morning. Has not vomited. Drank some tea and water yesterday, nothing else. Does not have appetite and no desire for food.   Objective: Vitals:   07/25/17 1830 07/25/17 2117 07/26/17 2010 07/27/17 0650  BP: (!) 162/60 (!) 159/67 (!) 158/59 (!) 173/58  Pulse:  65 77 78  Resp:  18 18 19   Temp:  98.7 F (37.1 C) 98 F (36.7 C) 98.6 F (37 C)  TempSrc:  Oral Oral Oral  SpO2:  97% 100% 97%  Weight: 56.4 kg (124 lb 5.4 oz)     Height: 5\' 1"  (1.549 m)       Intake/Output Summary (Last 24 hours) at 07/27/17 1155 Last data filed at 07/27/17 0600  Gross per 24 hour  Intake          1081.44 ml  Output  0 ml  Net          1081.44 ml   Filed Weights   07/25/17 1830  Weight: 56.4 kg (124 lb 5.4 oz)    Examination:  General exam: Appears calm and comfortable, elderly and thin  Respiratory system: Clear to auscultation. Respiratory effort normal. Cardiovascular system: S1 & S2 heard, RRR. No JVD, murmurs, rubs, gallops or clicks. No pedal edema. Gastrointestinal system: Abdomen is nondistended, soft and nontender. No organomegaly or masses felt. Normal bowel sounds heard. Central nervous system: Alert and oriented. Nonfocal exam  Extremities:  Symmetric 5 x 5 power. Skin: No rashes, lesions or ulcers Psychiatry: Judgement and insight appear normal. Mood & affect flat.   Data Reviewed: I have personally reviewed following labs and imaging studies  CBC:  Recent Labs Lab 07/22/17 1333 07/25/17 1012 07/25/17 1204 07/26/17 0117 07/27/17 0430  WBC 11.1* 9.6 12.2* 13.6* 12.5*  NEUTROABS 8.0* 7.0* 8.8*  --  8.5*  HGB 9.8* 8.6* 8.9* 7.5* 7.3*  HCT 30.6* 27.0* 26.8* 22.2* 22.1*  MCV 92.9 90.6 89.9 91.0 89.5  PLT 319 303 318 278 191   Basic Metabolic Panel:  Recent Labs Lab 07/22/17 1333 07/25/17 1012 07/25/17 1204 07/26/17 0117 07/27/17 0430  NA 144 150* 148* 148* 150*  K 3.0* 2.9* 2.8* 4.3 4.9  CL  --   --  109 117* 123*  CO2 14* 15* 15* 13* 14*  GLUCOSE 73 112 113* 72 107*  BUN 9.7 16.3 17 14 11   CREATININE 1.1 1.5* 1.49* 1.25* 1.12*  CALCIUM 6.6* 6.8* 6.6* 5.8* 6.2*  MG  --   --  1.5* 2.2 1.8  PHOS  --   --   --  1.5* 1.2*   GFR: Estimated Creatinine Clearance: 25.7 mL/min (A) (by C-G formula based on SCr of 1.12 mg/dL (H)). Liver Function Tests:  Recent Labs Lab 07/22/17 1333 07/25/17 1012 07/25/17 1204 07/26/17 0117 07/27/17 0430  AST 47* 40* 43* 38 31  ALT 20 18 21 17 17   ALKPHOS 168* 146 137* 109 100  BILITOT 0.88 0.81 1.9* 1.1 1.1  PROT 6.9 6.7 7.0 5.9* 5.5*  ALBUMIN 2.7* 2.6* 2.9* 2.3* 2.2*    Recent Labs Lab 07/25/17 1204  LIPASE 16   No results for input(s): AMMONIA in the last 168 hours. Coagulation Profile: No results for input(s): INR, PROTIME in the last 168 hours. Cardiac Enzymes: No results for input(s): CKTOTAL, CKMB, CKMBINDEX, TROPONINI in the last 168 hours. BNP (last 3 results) No results for input(s): PROBNP in the last 8760 hours. HbA1C: No results for input(s): HGBA1C in the last 72 hours. CBG: No results for input(s): GLUCAP in the last 168 hours. Lipid Profile: No results for input(s): CHOL, HDL, LDLCALC, TRIG, CHOLHDL, LDLDIRECT in the last 72 hours. Thyroid  Function Tests: No results for input(s): TSH, T4TOTAL, FREET4, T3FREE, THYROIDAB in the last 72 hours. Anemia Panel: No results for input(s): VITAMINB12, FOLATE, FERRITIN, TIBC, IRON, RETICCTPCT in the last 72 hours. Sepsis Labs:  Recent Labs Lab 07/25/17 1910 07/25/17 2143 07/26/17 0117  LATICACIDVEN 2.0* 2.3* 1.0    Recent Results (from the past 240 hour(s))  TECHNOLOGIST REVIEW     Status: None   Collection Time: 07/25/17 10:12 AM  Result Value Ref Range Status   Technologist Review Oc meta and myelocyte  Final       Radiology Studies: Acute Abdominal Series  Result Date: 07/25/2017 CLINICAL DATA:  N/V/D today; hx breast CA, MI esophageal ulcer; HTN; smoker  EXAM: DG ABDOMEN ACUTE W/ 1V CHEST COMPARISON:  Chest x-ray dated 05/19/2017. FINDINGS: Single view of the chest: Heart size and mediastinal contours are stable. Small nodular densities are again seen bilaterally, stable compared to the earlier chest x-ray, corresponding to the multiple pulmonary nodules better demonstrated on chest CT of 05/06/2017. No new lung findings. No pleural effusion or pneumothorax seen. Osseous structures about the chest are unremarkable. Supine and decubitus views of the abdomen: No dilated large or small bowel loops. Patulous loop of gas-filled bowel in the right lower quadrant is of uncertain significance. No evidence of free intraperitoneal air. No evidence of soft tissue mass or abnormal fluid collection. No acute or suspicious osseous finding. IMPRESSION: 1. Stable chest x-ray. No evidence of pneumonia or pulmonary edema. Small nodular densities again seen within each lung, stable compared to the chest x-ray of 05/19/2017, corresponding to multiple pulmonary nodules better demonstrated on chest CT of 05/06/2017, described as metastatic disease on the CT report. 2. Nonobstructive bowel gas pattern. Patulous loop of bowel in the right lower quadrant is of uncertain significance, but would consider  follow-up chest x-ray in 12-24 hours to ensure stability or resolution. Electronically Signed   By: Franki Cabot M.D.   On: 07/25/2017 18:56      Scheduled Meds: . chlorhexidine  15 mL Mouth Rinse BID  . feeding supplement  1 Container Oral TID BM  . mouth rinse  15 mL Mouth Rinse q12n4p  . ondansetron (ZOFRAN) IV  4 mg Intravenous Q6H   Continuous Infusions:    LOS: 1 day    Time spent: 30 minutes   Dessa Phi, DO [facility] [email or web address] Password [email or web address] 07/27/2017, 11:55 AM

## 2017-07-27 NOTE — Progress Notes (Addendum)
Hospice and [facility]) Hospital Liaison:  RN visit.  Notified by Joen Laura, of patient/family request for [facility] services at home after discharge.  Chart and patient information are being reviewed with [facility] physician.  Hospice eligibility pending at this time.    Writer spoke with daughter (over the phone) and patient (at bedside), to initiate education related to hospice philosophy, services and team approach to care.  Patient/family verbalized understanding of information given.  Per discussion, plan is for discharge to home by [facility] 07/28/17.  Please send signed and completed DNR form home with patient/family.  Patient will need prescriptions for discharge comfort medications.  DME needs have been discussed, patient currently has the following equipment in the home:  [place], W/C, hospital bed, transfer bench, and BSC.  Patient/family requests the following DME for delivery to the home:  Nothing at this time.  The home address has been verified and is correct in the chart.  Chiqu[place]ata, daughter, is the family contact person.  Per daughter, patient/family has advised that she will not be pursuing any additional curative treatments.  She advised that they had some radiation appointment that will be cancelled also.    [facility] is aware of the above.  Completed discharge summary will need to be faxed to [facility] at [phone], when final.  Please notify [facility] when patient is ready to leave the unit at discharge.  Call [phone] or [phone] if after 5pm.  [facility] information and contact numbers have been given to patient (left in the room) during visit.  Above information shared with Edwin Cap, [facility].  Please call with any hospice related questions.    Thank you for the referral.  Edyth Gunnels, RN, BSN [facility] Liaison [phone]  All hospital liaisons are now on [place].

## 2017-07-27 NOTE — Progress Notes (Signed)
CRITICAL VALUE ALERT  Critical Value:  Ca 6.2  Date & Time Notied:  07/27/17 0522  Provider Notified: Georges Mouse  Orders Received/Actions taken: 1 gm Calcium gluconate ordered.

## 2017-07-28 LAB — FOLATE RBC
FOLATE, RBC: 1007 ng/mL (ref >498)
Folate, Hemolysate: 233.7 ng/mL
Hematocrit: 23.2 % — ABNORMAL LOW (ref 34.0–46.6)

## 2017-07-28 MED ORDER — OXYCODONE HCL 5 MG PO TABS: 5.0000 mg | ORAL_TABLET | Freq: Four times a day (QID) | ORAL | 0 refills | Status: AC | PRN

## 2017-07-28 MED ORDER — PROMETHAZINE HCL 25 MG PO TABS: 25.0000 mg | ORAL_TABLET | Freq: Four times a day (QID) | ORAL | 0 refills | Status: AC | PRN

## 2017-07-28 MED ORDER — ONDANSETRON HCL 8 MG PO TABS: 8.0000 mg | ORAL_TABLET | Freq: Three times a day (TID) | ORAL | 0 refills | Status: AC | PRN

## 2017-07-28 MED ORDER — ALPRAZOLAM 0.5 MG PO TABS: 0.5000 mg | ORAL_TABLET | Freq: Two times a day (BID) | ORAL | 0 refills | Status: AC | PRN

## 2017-07-28 NOTE — Progress Notes (Signed)
Spoke with pt's daughter at bedside to confirm address. [facility] called for transportation home.

## 2017-07-28 NOTE — Discharge Summary (Signed)
Physician Discharge Summary  Samantha Abbott NIO:[id number] DOB: 01-Jul-1925 DOA: 07/25/2017  PCP: Benito Mccreedy, MD  Admit date: 07/25/2017 Discharge date: 07/28/2017  Admitted From: Home Disposition:  Home with hospice   Discharge Condition: Terminal, hospice CODE STATUS: DNR  Diet recommendation: General   Brief/Interim Summary: Samantha Abbott a 83 y.o.femalewith medical history significant for invasive ductal breastcancer on 03/2017, dysrhythmia, HTN, and GERD;who presents with generalized weakness over the last 2 weeks as well as intractable nausea, vomiting, decreased appetite with decreased oral intake, increasing lethargy. Workup in the ED revealed hypernatremia with a sodium of 150, hypokalemia at 2.8 and hypomagnesemia at 1.5. She was also noted to be in mild renal failure with creatinine of 1.5. She was treated with supportive care with IV fluids. Creatinine continued to improve, metabolic derangements slowly improved. However, she continued to have decreased appetite, no motivation to eat or ambulate. Discussed with daughter and patient, and patient has decided to be discharged home with home hospice for end-of-life care.  Discharge Diagnoses:  Principal Problem:   Intractable nausea and vomiting Active Problems:   GERD (gastroesophageal reflux disease)   Essential hypertension   Hypokalemia  Intractable nausea, vomiting -Abdominal x-ray without obstruction -Improved, supportive care and antiemetics. No further episodes of vomiting in the hospital.  -Has had poor oral intake, does not have any motivation for food  -Will discharge with zofran and phenergan   AKI on CKD stage 4 -Due to poor oral intake -Baseline Cr has been 1.8 in May, most recently 1.1 in July  -Back to baseline   Hypokalemia -Improved with replacement   Hypomagnesemia -Improved with replacement   Hypophosphatemia -Replaced  Hypocalcemia -Replaced  Metabolic  acidosis -Improved   Hypernatremia -Due to poor oral intake   Anemia of chronic disease -Baseline Hgb 9. Without evident bleed. Ordered anemia labs.   Stage IV breast cancer, estrogen receptor positive  -Follows with Dr. Jana Hakim  Goals of care -Discussed with daughter and patient. She has metastatic breast cancer, treatment is not curative. We discussed her lack of appetite, lack of motivation to eat or sustain herself. We discussed her will to live, if she wants to continue medical treatment for her metabolic derangements, further work up. We discussed that we would not be pursuing any aggressive measures such "force feeding" to sustain her. I encouraged daughter to discuss with patient her goals, if she wants to be comfortable at home even if it meant shortening her days alive vs continue medical care in the hospital. They have ultimately decided home with hospice.    Discharge Instructions  Discharge Instructions    Diet general    Complete by:  As directed    Increase activity slowly    Complete by:  As directed      Allergies as of 07/28/2017   No Known Allergies     Medication List    STOP taking these medications   amoxicillin-clavulanate 500-125 MG tablet Commonly known as:  AUGMENTIN   anastrozole 1 MG tablet Commonly known as:  ARIMIDEX   atorvastatin 20 MG tablet Commonly known as:  LIPITOR   fluconazole 100 MG tablet Commonly known as:  DIFLUCAN   hydrALAZINE 10 MG tablet Commonly known as:  APRESOLINE   potassium chloride 20 MEQ/15ML (10%) Soln   prochlorperazine 10 MG tablet Commonly known as:  COMPAZINE   rosuvastatin 10 MG tablet Commonly known as:  CRESTOR   VALSARTAN PO   vitamin B-12 1000 MCG tablet Commonly known as:  CYANOCOBALAMIN   Vitamin D (Cholecalciferol) 1000 units Caps     TAKE these medications   ALPRAZolam 0.5 MG tablet Commonly known as:  XANAX Take 1 tablet (0.5 mg total) by mouth 2 (two) times daily as needed for  anxiety.   feeding supplement (ENSURE ENLIVE) Liqd Take 237 mLs by mouth 3 (three) times daily between meals.   ondansetron 8 MG tablet Commonly known as:  ZOFRAN Take 1 tablet (8 mg total) by mouth every 8 (eight) hours as needed for nausea or vomiting.   oxyCODONE 5 MG immediate release tablet Commonly known as:  Oxy IR/ROXICODONE Take 1 tablet (5 mg total) by mouth every 6 (six) hours as needed for severe pain.   polyethylene glycol packet Commonly known as:  MIRALAX / GLYCOLAX Take 17 g by mouth every other day.   promethazine 25 MG tablet Commonly known as:  PHENERGAN Take 1 tablet (25 mg total) by mouth every 6 (six) hours as needed for nausea or vomiting.      Follow-up Information    [facility] Follow up.   Specialty:  Hospice and Palliative Medicine Why:  Home Hospice RN - will contact you to arrange initial appointment Contact information: [place] [address] [phone]          No Known Allergies  Consultations:  None   Procedures/Studies: Acute Abdominal Series  Result Date: 07/25/2017 CLINICAL DATA:  N/V/D today; hx breast CA, MI esophageal ulcer; HTN; smoker EXAM: DG ABDOMEN ACUTE W/ 1V CHEST COMPARISON:  Chest x-ray dated 05/19/2017. FINDINGS: Single view of the chest: Heart size and mediastinal contours are stable. Small nodular densities are again seen bilaterally, stable compared to the earlier chest x-ray, corresponding to the multiple pulmonary nodules better demonstrated on chest CT of 05/06/2017. No new lung findings. No pleural effusion or pneumothorax seen. Osseous structures about the chest are unremarkable. Supine and decubitus views of the abdomen: No dilated large or small bowel loops. Patulous loop of gas-filled bowel in the right lower quadrant is of uncertain significance. No evidence of free intraperitoneal air. No evidence of soft tissue mass or abnormal fluid collection. No acute or suspicious osseous  finding. IMPRESSION: 1. Stable chest x-ray. No evidence of pneumonia or pulmonary edema. Small nodular densities again seen within each lung, stable compared to the chest x-ray of 05/19/2017, corresponding to multiple pulmonary nodules better demonstrated on chest CT of 05/06/2017, described as metastatic disease on the CT report. 2. Nonobstructive bowel gas pattern. Patulous loop of bowel in the right lower quadrant is of uncertain significance, but would consider follow-up chest x-ray in 12-24 hours to ensure stability or resolution. Electronically Signed   By: Franki Cabot M.D.   On: 07/25/2017 18:56       Discharge Exam: Vitals:   07/27/17 2043 07/28/17 0352  BP: (!) 127/92 (!) 181/67  Pulse: 86 86  Resp: 18 20  Temp: 97.7 F (36.5 C) 98.2 F (36.8 C)   Vitals:   07/26/17 2010 07/27/17 0650 07/27/17 2043 07/28/17 0352  BP: (!) 158/59 (!) 173/58 (!) 127/92 (!) 181/67  Pulse: 77 78 86 86  Resp: 18 19 18 20   Temp: 98 F (36.7 C) 98.6 F (37 C) 97.7 F (36.5 C) 98.2 F (36.8 C)  TempSrc: Oral Oral Oral Oral  SpO2: 100% 97% 100% 100%  Weight:      Height:        General: Pt is alert, awake, not in acute distress Cardiovascular: RRR,  S1/S2 +, no rubs, no gallops Respiratory: CTA bilaterally, no wheezing, no rhonchi Abdominal: Soft, NT, ND, bowel sounds + Extremities: no edema, no cyanosis    The results of significant diagnostics from this hospitalization (including imaging, microbiology, ancillary and laboratory) are listed below for reference.     Microbiology: Recent Results (from the past 240 hour(s))  TECHNOLOGIST REVIEW     Status: None   Collection Time: 07/25/17 10:12 AM  Result Value Ref Range Status   Technologist Review Oc meta and myelocyte  Final     Labs: BNP (last 3 results) No results for input(s): BNP in the last 8760 hours. Basic Metabolic Panel:  Recent Labs Lab 07/22/17 1333 07/25/17 1012 07/25/17 1204 07/26/17 0117 07/27/17 0430  NA  144 150* 148* 148* 150*  K 3.0* 2.9* 2.8* 4.3 4.9  CL  --   --  109 117* 123*  CO2 14* 15* 15* 13* 14*  GLUCOSE 73 112 113* 72 107*  BUN 9.7 16.3 17 14 11   CREATININE 1.1 1.5* 1.49* 1.25* 1.12*  CALCIUM 6.6* 6.8* 6.6* 5.8* 6.2*  MG  --   --  1.5* 2.2 1.8  PHOS  --   --   --  1.5* 1.2*   Liver Function Tests:  Recent Labs Lab 07/22/17 1333 07/25/17 1012 07/25/17 1204 07/26/17 0117 07/27/17 0430  AST 47* 40* 43* 38 31  ALT 20 18 21 17 17   ALKPHOS 168* 146 137* 109 100  BILITOT 0.88 0.81 1.9* 1.1 1.1  PROT 6.9 6.7 7.0 5.9* 5.5*  ALBUMIN 2.7* 2.6* 2.9* 2.3* 2.2*    Recent Labs Lab 07/25/17 1204  LIPASE 16   No results for input(s): AMMONIA in the last 168 hours. CBC:  Recent Labs Lab 07/22/17 1333 07/25/17 1012 07/25/17 1204 07/26/17 0117 07/27/17 0430  WBC 11.1* 9.6 12.2* 13.6* 12.5*  NEUTROABS 8.0* 7.0* 8.8*  --  8.5*  HGB 9.8* 8.6* 8.9* 7.5* 7.3*  HCT 30.6* 27.0* 26.8* 22.2* 22.1*  MCV 92.9 90.6 89.9 91.0 89.5  PLT 319 303 318 278 242   Cardiac Enzymes: No results for input(s): CKTOTAL, CKMB, CKMBINDEX, TROPONINI in the last 168 hours. BNP: Invalid input(s): POCBNP CBG: No results for input(s): GLUCAP in the last 168 hours. D-Dimer No results for input(s): DDIMER in the last 72 hours. Hgb A1c No results for input(s): HGBA1C in the last 72 hours. Lipid Profile No results for input(s): CHOL, HDL, LDLCALC, TRIG, CHOLHDL, LDLDIRECT in the last 72 hours. Thyroid function studies No results for input(s): TSH, T4TOTAL, T3FREE, THYROIDAB in the last 72 hours.  Invalid input(s): FREET3 Anemia work up  Recent Labs  07/27/17 0903  VITAMINB12 1,325*  FERRITIN 582*  TIBC 133*  IRON 57   Urinalysis    Component Value Date/Time   COLORURINE YELLOW 07/25/2017 1909   APPEARANCEUR CLEAR 07/25/2017 1909   LABSPEC 1.015 07/25/2017 1909   PHURINE 5.0 07/25/2017 1909   GLUCOSEU NEGATIVE 07/25/2017 1909   HGBUR NEGATIVE 07/25/2017 1909   BILIRUBINUR  NEGATIVE 07/25/2017 1909   KETONESUR 20 (A) 07/25/2017 1909   PROTEINUR 30 (A) 07/25/2017 1909   UROBILINOGEN 1 04/16/2012 1205   NITRITE NEGATIVE 07/25/2017 1909   LEUKOCYTESUR NEGATIVE 07/25/2017 1909   Sepsis Labs Invalid input(s): PROCALCITONIN,  WBC,  LACTICIDVEN Microbiology Recent Results (from the past 240 hour(s))  TECHNOLOGIST REVIEW     Status: None   Collection Time: 07/25/17 10:12 AM  Result Value Ref Range Status   Technologist Review Oc meta and  myelocyte  Final     Time coordinating discharge: 40 minutes  SIGNED:  Dessa Phi, DO [facility] Pager [phone]  If 7PM-7AM, please contact night-coverage [email or web address] Password [email or web address] 07/28/2017, 11:35 AM

## 2017-07-28 NOTE — Plan of Care (Signed)
Problem: Education: Goal: Knowledge of [facility] Education information/materials will improve Outcome: Progressing Pt to D/C tomorrow with hospice.

## 2017-08-18 ENCOUNTER — Other Ambulatory Visit: Payer: Self-pay | Admitting: Hematology and Oncology

## 2017-08-23 DEATH — deceased
